# Patient Record
Sex: Female | Born: 1975 | Race: White | Hispanic: No | Smoking: Current every day smoker
Health system: Southern US, Community
[De-identification: ages and names within clinical notes are randomized; demographics above are authoritative.]

## PROBLEM LIST (undated history)

## (undated) DIAGNOSIS — I330 Acute and subacute infective endocarditis: Secondary | ICD-10-CM

## (undated) DIAGNOSIS — D649 Anemia, unspecified: Secondary | ICD-10-CM

## (undated) DIAGNOSIS — F1990 Other psychoactive substance use, unspecified, uncomplicated: Secondary | ICD-10-CM

## (undated) DIAGNOSIS — N179 Acute kidney failure, unspecified: Secondary | ICD-10-CM

## (undated) DIAGNOSIS — O039 Complete or unspecified spontaneous abortion without complication: Secondary | ICD-10-CM

## (undated) DIAGNOSIS — B1920 Unspecified viral hepatitis C without hepatic coma: Secondary | ICD-10-CM

## (undated) HISTORY — PX: BUNIONECTOMY: SHX129

## (undated) HISTORY — PX: TONSILLECTOMY: SUR1361

---

## 2013-12-28 ENCOUNTER — Inpatient Hospital Stay (HOSPITAL_COMMUNITY): Payer: Self-pay

## 2013-12-28 ENCOUNTER — Emergency Department (HOSPITAL_COMMUNITY): Payer: Self-pay

## 2013-12-28 ENCOUNTER — Inpatient Hospital Stay (HOSPITAL_COMMUNITY)
Admission: EM | Admit: 2013-12-28 | Discharge: 2014-01-02 | DRG: 871 | Disposition: A | Discharge status: Home / Self Care | Payer: Self-pay | Attending: Internal Medicine | Admitting: Internal Medicine

## 2013-12-28 ENCOUNTER — Encounter (HOSPITAL_COMMUNITY): Payer: Self-pay | Admitting: Emergency Medicine

## 2013-12-28 DIAGNOSIS — I079 Rheumatic tricuspid valve disease, unspecified: Secondary | ICD-10-CM | POA: Diagnosis present

## 2013-12-28 DIAGNOSIS — F17200 Nicotine dependence, unspecified, uncomplicated: Secondary | ICD-10-CM | POA: Diagnosis present

## 2013-12-28 DIAGNOSIS — R6520 Severe sepsis without septic shock: Secondary | ICD-10-CM

## 2013-12-28 DIAGNOSIS — F1910 Other psychoactive substance abuse, uncomplicated: Secondary | ICD-10-CM | POA: Diagnosis present

## 2013-12-28 DIAGNOSIS — N390 Urinary tract infection, site not specified: Secondary | ICD-10-CM | POA: Diagnosis not present

## 2013-12-28 DIAGNOSIS — F17210 Nicotine dependence, cigarettes, uncomplicated: Secondary | ICD-10-CM | POA: Diagnosis present

## 2013-12-28 DIAGNOSIS — E876 Hypokalemia: Secondary | ICD-10-CM | POA: Diagnosis not present

## 2013-12-28 DIAGNOSIS — B1920 Unspecified viral hepatitis C without hepatic coma: Secondary | ICD-10-CM | POA: Diagnosis present

## 2013-12-28 DIAGNOSIS — D509 Iron deficiency anemia, unspecified: Secondary | ICD-10-CM | POA: Diagnosis present

## 2013-12-28 DIAGNOSIS — D696 Thrombocytopenia, unspecified: Secondary | ICD-10-CM | POA: Diagnosis present

## 2013-12-28 DIAGNOSIS — R768 Other specified abnormal immunological findings in serum: Secondary | ICD-10-CM

## 2013-12-28 DIAGNOSIS — R6521 Severe sepsis with septic shock: Secondary | ICD-10-CM

## 2013-12-28 DIAGNOSIS — R509 Fever, unspecified: Secondary | ICD-10-CM

## 2013-12-28 DIAGNOSIS — M60009 Infective myositis, unspecified site: Secondary | ICD-10-CM | POA: Diagnosis present

## 2013-12-28 DIAGNOSIS — M25519 Pain in unspecified shoulder: Secondary | ICD-10-CM

## 2013-12-28 DIAGNOSIS — R579 Shock, unspecified: Secondary | ICD-10-CM

## 2013-12-28 DIAGNOSIS — A419 Sepsis, unspecified organism: Principal | ICD-10-CM | POA: Diagnosis present

## 2013-12-28 DIAGNOSIS — N911 Secondary amenorrhea: Secondary | ICD-10-CM | POA: Diagnosis present

## 2013-12-28 DIAGNOSIS — M009 Pyogenic arthritis, unspecified: Secondary | ICD-10-CM | POA: Diagnosis present

## 2013-12-28 DIAGNOSIS — Z8249 Family history of ischemic heart disease and other diseases of the circulatory system: Secondary | ICD-10-CM

## 2013-12-28 DIAGNOSIS — N912 Amenorrhea, unspecified: Secondary | ICD-10-CM

## 2013-12-28 DIAGNOSIS — R609 Edema, unspecified: Secondary | ICD-10-CM | POA: Diagnosis present

## 2013-12-28 DIAGNOSIS — M25511 Pain in right shoulder: Secondary | ICD-10-CM

## 2013-12-28 DIAGNOSIS — I330 Acute and subacute infective endocarditis: Secondary | ICD-10-CM | POA: Diagnosis present

## 2013-12-28 DIAGNOSIS — D649 Anemia, unspecified: Secondary | ICD-10-CM | POA: Diagnosis present

## 2013-12-28 HISTORY — DX: Other psychoactive substance use, unspecified, uncomplicated: F19.90

## 2013-12-28 HISTORY — DX: Acute and subacute infective endocarditis: I33.0

## 2013-12-28 HISTORY — DX: Anemia, unspecified: D64.9

## 2013-12-28 LAB — COMPREHENSIVE METABOLIC PANEL
ALK PHOS: 119 U/L — AB (ref 39–117)
ALK PHOS: 146 U/L — AB (ref 39–117)
ALT: 113 U/L — AB (ref 0–35)
ALT: 148 U/L — AB (ref 0–35)
AST: 41 U/L — ABNORMAL HIGH (ref 0–37)
AST: 34 U/L (ref 0–37)
Albumin: 2.5 g/dL — ABNORMAL LOW (ref 3.5–5.2)
Albumin: 2 g/dL — ABNORMAL LOW (ref 3.5–5.2)
Anion gap: 18 — ABNORMAL HIGH (ref 5–15)
Anion gap: 14 (ref 5–15)
BUN: 14 mg/dL (ref 6–23)
BUN: 16 mg/dL (ref 6–23)
CALCIUM: 7.7 mg/dL — AB (ref 8.4–10.5)
CHLORIDE: 93 meq/L — AB (ref 96–112)
CO2: 22 mEq/L (ref 19–32)
CO2: 22 meq/L (ref 19–32)
Calcium: 8.1 mg/dL — ABNORMAL LOW (ref 8.4–10.5)
Chloride: 106 mEq/L (ref 96–112)
Creatinine, Ser: 0.76 mg/dL (ref 0.50–1.10)
Creatinine, Ser: 0.72 mg/dL (ref 0.50–1.10)
GFR calc non Af Amer: >90 mL/min (ref >90)
GLUCOSE: 112 mg/dL — AB (ref 70–99)
Glucose, Bld: 107 mg/dL — ABNORMAL HIGH (ref 70–99)
Potassium: 3.8 mEq/L (ref 3.7–5.3)
Potassium: 3.5 mEq/L — ABNORMAL LOW (ref 3.7–5.3)
SODIUM: 133 meq/L — AB (ref 137–147)
SODIUM: 142 meq/L (ref 137–147)
TOTAL PROTEIN: 5.6 g/dL — AB (ref 6.0–8.3)
TOTAL PROTEIN: 6.6 g/dL (ref 6.0–8.3)
Total Bilirubin: 0.6 mg/dL (ref 0.3–1.2)
Total Bilirubin: 0.6 mg/dL (ref 0.3–1.2)

## 2013-12-28 LAB — CBC WITH DIFFERENTIAL/PLATELET
BASOS PCT: 0 % (ref 0–1)
Basophils Absolute: 0 10*3/uL (ref 0.0–0.1)
Basophils Absolute: 0 10*3/uL (ref 0.0–0.1)
Basophils Relative: 0 % (ref 0–1)
EOS PCT: 0 % (ref 0–5)
Eosinophils Absolute: 0 10*3/uL (ref 0.0–0.7)
Eosinophils Absolute: 0 10*3/uL (ref 0.0–0.7)
Eosinophils Relative: 0 % (ref 0–5)
HCT: 30.4 % — ABNORMAL LOW (ref 36.0–46.0)
HCT: 27.7 % — ABNORMAL LOW (ref 36.0–46.0)
HEMOGLOBIN: 9 g/dL — AB (ref 12.0–15.0)
HEMOGLOBIN: 9.7 g/dL — AB (ref 12.0–15.0)
LYMPHS PCT: 30 % (ref 12–46)
Lymphocytes Relative: 24 % (ref 12–46)
Lymphs Abs: 2.3 10*3/uL (ref 0.7–4.0)
Lymphs Abs: 2 10*3/uL (ref 0.7–4.0)
MCH: 25.1 pg — AB (ref 26.0–34.0)
MCH: 25.9 pg — ABNORMAL LOW (ref 26.0–34.0)
MCHC: 31.9 g/dL (ref 30.0–36.0)
MCHC: 32.5 g/dL (ref 30.0–36.0)
MCV: 78.8 fL (ref 78.0–100.0)
MCV: 79.8 fL (ref 78.0–100.0)
MONO ABS: 1.1 10*3/uL — AB (ref 0.1–1.0)
Monocytes Absolute: 1 10*3/uL (ref 0.1–1.0)
Monocytes Relative: 12 % (ref 3–12)
Monocytes Relative: 15 % — ABNORMAL HIGH (ref 3–12)
NEUTROS PCT: 55 % (ref 43–77)
Neutro Abs: 6.1 10*3/uL (ref 1.7–7.7)
Neutro Abs: 3.8 10*3/uL (ref 1.7–7.7)
Neutrophils Relative %: 64 % (ref 43–77)
PLATELETS: 103 10*3/uL — AB (ref 150–400)
Platelets: 125 10*3/uL — ABNORMAL LOW (ref 150–400)
RBC: 3.86 MIL/uL — ABNORMAL LOW (ref 3.87–5.11)
RBC: 3.47 MIL/uL — AB (ref 3.87–5.11)
RDW: 16.8 % — ABNORMAL HIGH (ref 11.5–15.5)
RDW: 16.9 % — ABNORMAL HIGH (ref 11.5–15.5)
WBC: 9.5 10*3/uL (ref 4.0–10.5)
WBC: 6.8 10*3/uL (ref 4.0–10.5)

## 2013-12-28 LAB — PROTIME-INR
INR: 1.25 (ref 0.00–1.49)
Prothrombin Time: 15.7 seconds — ABNORMAL HIGH (ref 11.6–15.2)

## 2013-12-28 LAB — C-REACTIVE PROTEIN: CRP: 12.5 mg/dL — ABNORMAL HIGH (ref <0.60)

## 2013-12-28 LAB — URINALYSIS, ROUTINE W REFLEX MICROSCOPIC
Glucose, UA: NEGATIVE mg/dL
KETONES UR: NEGATIVE mg/dL
Nitrite: POSITIVE — AB
Protein, ur: 30 mg/dL — AB
SPECIFIC GRAVITY, URINE: 1.022 (ref 1.005–1.030)
UROBILINOGEN UA: 1 mg/dL (ref 0.0–1.0)
pH: 6 (ref 5.0–8.0)

## 2013-12-28 LAB — IRON AND TIBC
IRON: 16 ug/dL — AB (ref 42–135)
Saturation Ratios: 8 % — ABNORMAL LOW (ref 20–55)
TIBC: 205 ug/dL — ABNORMAL LOW (ref 250–470)
UIBC: 189 ug/dL (ref 125–400)

## 2013-12-28 LAB — SEDIMENTATION RATE: Sed Rate: 95 mm/hr — ABNORMAL HIGH (ref 0–22)

## 2013-12-28 LAB — RETICULOCYTES
RBC.: 3.47 MIL/uL — ABNORMAL LOW (ref 3.87–5.11)
Retic Count, Absolute: 13.9 10*3/uL — ABNORMAL LOW (ref 19.0–186.0)
Retic Ct Pct: 0.4 % (ref 0.4–3.1)

## 2013-12-28 LAB — URINE MICROSCOPIC-ADD ON

## 2013-12-28 LAB — MRSA PCR SCREENING: MRSA BY PCR: NEGATIVE

## 2013-12-28 LAB — HCG, QUANTITATIVE, PREGNANCY: hCG, Beta Chain, Quant, S: <1 m[IU]/mL (ref <5)

## 2013-12-28 LAB — ETHANOL: Alcohol, Ethyl (B): <11 mg/dL (ref 0–11)

## 2013-12-28 LAB — FERRITIN: Ferritin: 117 ng/mL (ref 10–291)

## 2013-12-28 LAB — MONONUCLEOSIS SCREEN: Mono Screen: NEGATIVE

## 2013-12-28 LAB — ACETAMINOPHEN LEVEL

## 2013-12-28 LAB — TSH: TSH: 0.392 u[IU]/mL (ref 0.350–4.500)

## 2013-12-28 LAB — LACTATE DEHYDROGENASE: LDH: 179 U/L (ref 94–250)

## 2013-12-28 LAB — PROLACTIN: Prolactin: 2.2 ng/mL

## 2013-12-28 LAB — I-STAT CG4 LACTIC ACID, ED: Lactic Acid, Venous: 1.18 mmol/L (ref 0.5–2.2)

## 2013-12-28 LAB — PREGNANCY, URINE: Preg Test, Ur: NEGATIVE

## 2013-12-28 LAB — FOLLICLE STIMULATING HORMONE: FSH: 4.2 m[IU]/mL

## 2013-12-28 MED ORDER — ONDANSETRON HCL 4 MG PO TABS: 4.0000 mg | ORAL_TABLET | Freq: Four times a day (QID) | ORAL | Status: DC | PRN

## 2013-12-28 MED ORDER — DEXTROSE 5 % IV SOLN
1.0000 g | INTRAVENOUS | Status: DC
  Administered 2013-12-28 – 2013-12-29 (×2): 1 g via INTRAVENOUS
  Filled 2013-12-28 (×4): qty 10

## 2013-12-28 MED ORDER — SODIUM CHLORIDE 0.9 % IJ SOLN
3.0000 mL | Freq: Two times a day (BID) | INTRAMUSCULAR | Status: DC
  Administered 2013-12-31: 3 mL via INTRAVENOUS

## 2013-12-28 MED ORDER — HYDROCODONE-ACETAMINOPHEN 5-325 MG PO TABS: 1.0000 | ORAL_TABLET | ORAL | Status: DC | PRN

## 2013-12-28 MED ORDER — PIPERACILLIN-TAZOBACTAM 3.375 G IVPB 30 MIN
3.3750 g | Freq: Once | INTRAVENOUS | Status: AC
  Administered 2013-12-28: 3.375 g via INTRAVENOUS
  Filled 2013-12-28: qty 50

## 2013-12-28 MED ORDER — LIDOCAINE 5 % EX PTCH
1.0000 | MEDICATED_PATCH | CUTANEOUS | Status: DC
  Administered 2013-12-28 – 2014-01-02 (×6): 1 via TRANSDERMAL
  Filled 2013-12-28 (×6): qty 1

## 2013-12-28 MED ORDER — SODIUM CHLORIDE 0.9 % IV BOLUS (SEPSIS)
1000.0000 mL | Freq: Once | INTRAVENOUS | Status: AC
  Administered 2013-12-28: 1000 mL via INTRAVENOUS

## 2013-12-28 MED ORDER — ACETAMINOPHEN 325 MG PO TABS: 650.0000 mg | ORAL_TABLET | Freq: Four times a day (QID) | ORAL | Status: DC | PRN

## 2013-12-28 MED ORDER — DIPHENHYDRAMINE HCL 25 MG PO CAPS
25.0000 mg | ORAL_CAPSULE | Freq: Three times a day (TID) | ORAL | Status: DC | PRN
  Administered 2013-12-30: 25 mg via ORAL
  Filled 2013-12-28: qty 1

## 2013-12-28 MED ORDER — VANCOMYCIN HCL IN DEXTROSE 750-5 MG/150ML-% IV SOLN
750.0000 mg | Freq: Three times a day (TID) | INTRAVENOUS | Status: DC
  Administered 2013-12-28 – 2013-12-29 (×3): 750 mg via INTRAVENOUS
  Filled 2013-12-28 (×8): qty 150

## 2013-12-28 MED ORDER — VANCOMYCIN HCL 10 G IV SOLR: 1.0000 g | Freq: Once | INTRAVENOUS | Status: DC

## 2013-12-28 MED ORDER — ONDANSETRON HCL 4 MG/2ML IJ SOLN: 4.0000 mg | Freq: Three times a day (TID) | INTRAMUSCULAR | Status: DC | PRN

## 2013-12-28 MED ORDER — SODIUM CHLORIDE 0.9 % IV SOLN
INTRAVENOUS | Status: DC
  Administered 2013-12-28 – 2013-12-31 (×3): via INTRAVENOUS

## 2013-12-28 MED ORDER — OXYCODONE HCL 5 MG PO TABS
5.0000 mg | ORAL_TABLET | ORAL | Status: DC | PRN
  Administered 2013-12-28 (×4): 10 mg via ORAL
  Administered 2013-12-29: 5 mg via ORAL
  Administered 2013-12-29 – 2014-01-01 (×18): 10 mg via ORAL
  Administered 2014-01-01: 5 mg via ORAL
  Administered 2014-01-01 – 2014-01-02 (×6): 10 mg via ORAL
  Administered 2014-01-02: 5 mg via ORAL
  Filled 2013-12-28 (×25): qty 2
  Filled 2013-12-28: qty 1
  Filled 2013-12-28 (×9): qty 2

## 2013-12-28 MED ORDER — OXYCODONE-ACETAMINOPHEN 5-325 MG PO TABS
2.0000 | ORAL_TABLET | Freq: Once | ORAL | Status: AC
Start: 2013-12-28 — End: 2013-12-28
  Administered 2013-12-28: 2 via ORAL
  Filled 2013-12-28: qty 2

## 2013-12-28 MED ORDER — DOXYCYCLINE HYCLATE 100 MG IV SOLR
100.0000 mg | Freq: Two times a day (BID) | INTRAVENOUS | Status: DC
  Administered 2013-12-28 – 2013-12-29 (×3): 100 mg via INTRAVENOUS
  Filled 2013-12-28 (×5): qty 100

## 2013-12-28 MED ORDER — ENOXAPARIN SODIUM 40 MG/0.4ML ~~LOC~~ SOLN
40.0000 mg | SUBCUTANEOUS | Status: DC
  Administered 2013-12-28 – 2014-01-01 (×5): 40 mg via SUBCUTANEOUS
  Filled 2013-12-28 (×6): qty 0.4

## 2013-12-28 MED ORDER — VANCOMYCIN HCL IN DEXTROSE 1-5 GM/200ML-% IV SOLN
1000.0000 mg | Freq: Once | INTRAVENOUS | Status: AC
  Administered 2013-12-28: 1000 mg via INTRAVENOUS
  Filled 2013-12-28: qty 200

## 2013-12-28 MED ORDER — LIDOCAINE HCL (PF) 1 % IJ SOLN
INTRAMUSCULAR | Status: AC
  Administered 2013-12-28: 03:00:00
  Filled 2013-12-28: qty 5

## 2013-12-28 MED ORDER — ACETAMINOPHEN 650 MG RE SUPP: 650.0000 mg | Freq: Four times a day (QID) | RECTAL | Status: DC | PRN

## 2013-12-28 MED ORDER — ONDANSETRON HCL 4 MG/2ML IJ SOLN: 4.0000 mg | Freq: Four times a day (QID) | INTRAMUSCULAR | Status: DC | PRN

## 2013-12-28 MED ORDER — GADOBENATE DIMEGLUMINE 529 MG/ML IV SOLN
15.0000 mL | Freq: Once | INTRAVENOUS | Status: AC | PRN
  Administered 2013-12-28: 15 mL via INTRAVENOUS

## 2013-12-28 MED ORDER — FENTANYL CITRATE 0.05 MG/ML IJ SOLN
100.0000 ug | INTRAMUSCULAR | Status: DC | PRN
  Administered 2013-12-28 – 2014-01-02 (×32): 100 ug via INTRAVENOUS
  Filled 2013-12-28 (×33): qty 2

## 2013-12-28 MED ORDER — FAMOTIDINE IN NACL 20-0.9 MG/50ML-% IV SOLN
20.0000 mg | Freq: Two times a day (BID) | INTRAVENOUS | Status: DC
  Administered 2013-12-28 (×2): 20 mg via INTRAVENOUS
  Filled 2013-12-28 (×4): qty 50

## 2013-12-28 NOTE — ED Notes (Signed)
Dr. Hyacinth MeekerMiller at bedside. Pt reports swelling in her rt index finger, swelling went down and then she began having rt shoulder pain for about a week. Pt reports pain moves across her back into her neck and across her chest. Sx in shoulder started 3 days ago, and finger sx started a week ago. Pt unable to move her shoulder, has pain with deep inhalation. Pt rates pain 10/10.

## 2013-12-28 NOTE — Progress Notes (Signed)
Subjective: Pt has had MRI   Objective: Vital signs in last 24 hours: Temp:  [98 F (36.7 C)-102.8 F (39.3 C)] 98 F (36.7 C) (07/19 1831) Pulse Rate:  [77-124] 79 (07/19 1831) Resp:  [12-22] 18 (07/19 1831) BP: (87-137)/(55-96) 137/96 mmHg (07/19 1831) SpO2:  [95 %-100 %] 100 % (07/19 1831) Weight:  [151 lb (68.493 kg)-154 lb 12.2 oz (70.2 kg)] 154 lb 12.2 oz (70.2 kg) (07/19 0430)  Intake/Output from previous day: 07/18 0701 - 07/19 0700 In: 500 [I.V.:200; IV Piggyback:300] Out: 500 [Urine:500] Intake/Output this shift:     Recent Labs  12/28/13 0042 12/28/13 0546  HGB 9.7* 9.0*    Recent Labs  12/28/13 0042 12/28/13 0546  WBC 9.5 6.8  RBC 3.86* 3.47*  3.47*  HCT 30.4* 27.7*  PLT 125* 103*    Recent Labs  12/28/13 0042 12/28/13 0546  NA 133* 142  K 3.8 3.5*  CL 93* 106  CO2 22 22  BUN 16 14  CREATININE 0.76 0.72  GLUCOSE 112* 107*  CALCIUM 8.1* 7.7*    Recent Labs  12/28/13 0546  INR 1.25    not redone at this time MRI: edema in trapezius, deltoid, supraspinatus and some [facility] in [facility] which does not appear infectious Assessment/Plan: MRI shows edema in the anterior deltoid, trapezius muscle and supraspinatus with no abscess.  AC joint shows fluid with no abscess//  Will monitor for now but am uncomfortable injecting steroid in this setting.  Will need close f/u.    Dmarcus Decicco L 12/28/2013, 8:33 PM

## 2013-12-28 NOTE — H&P (Addendum)
[facility] History and Physical  Patient: Melanie Crane  ZOX:[id number]  DOB: 01/24/1976  DOS: the patient was seen and examined on 12/28/2013 PCP: No PCP Per Patient  Chief Complaint: Right shoulder pain  HPI: Melanie RavelingJoann Theresa Crane is a 38 y.o. female with no significant Past medical history. Patient presented with complaints of right shoulder pain that has been ongoing since last 3 days. She mentions that she woke up with from her sleep with swelling of her right index finger. It was not red but it was tender. The swelling resolved within 2 days but then she started having pain in her right shoulder. The shoulder pain progressively worsened to the extent that any movement of her shoulder was causing pain and therefore she decided to come to the hospital. She denies any fever, chills, nausea, vomiting, headache, blurred vision, sore throat, chest pain, shortness of breath, cough, diarrhea, abdominal pain, nausea, vomiting, acid reflux, burning urination, active bleeding, leg swelling, abdominal fullness, decreased appetite, focal deficit. She denies being on any prescription medications. She has taken Tylenol, ibuprofen, opana from her father for her pain without any benefit. She mentions her last beer was 4-5 months ago prior to that she had regular monthly periods she denies any vaginal discharge or bleeding. She denies anyone any spotting. She denies having any relationships at present. She denies any possibility of being pregnant. She denies any prior STDs.  The patient is coming from home. And at her baseline independent for most of her ADL.  Review of Systems: as mentioned in the history of present illness.  A Comprehensive review of the other systems is negative.  History reviewed. No pertinent past medical history. History reviewed. No pertinent past surgical history. Social History:  reports that she has been smoking.  She has never used smokeless tobacco. She reports that  she does not drink alcohol or use illicit drugs.  No Known Allergies  No family history on file.  Prior to Admission medications   Not on File    Physical Exam: Filed Vitals:   12/28/13 0300 12/28/13 0315 12/28/13 0430 12/28/13 0445  BP: 87/55 88/56 103/71 103/66  Pulse: 83 79 77 85  Temp:   98.8 F (37.1 C)   TempSrc:   Oral   Resp: 12 14 19 18   Height:   5\' 4"  (1.626 m)   Weight:   70.2 kg (154 lb 12.2 oz)   SpO2: 97% 96% 98% 96%    General: Alert, Awake and Oriented to Time, Place and Person. Appear in mild distress Eyes: PERRL ENT: Oral Mucosa clear moist. Neck: No JVD Cardiovascular: S1 and S2 Present, no Murmur, Peripheral Pulses Present Respiratory: Bilateral Air entry equal and Decreased, Clear to Auscultation, no Crackles, no wheezes Abdomen: Bowel Sound Present, Soft and Non tender Skin: No Rash Extremities: No Pedal edema, no calf tenderness Right shoulder tenderness radiating to base of the neck. No neck stiffness. No tenderness of the elbow joint. Some induration at the index finger but no warmth redness or tenderness.  Neurologic: Grossly no focal neuro deficit. No meningismus  Labs on Admission:  CBC:  Recent Labs Lab 12/28/13 0042 12/28/13 0546  WBC 9.5 6.8  NEUTROABS 6.1 PENDING  HGB 9.7* 9.0*  HCT 30.4* 27.7*  MCV 78.8 79.8  PLT 125* 103*    CMP     Component Value Date/Time   NA 142 12/28/2013 0546   K 3.5* 12/28/2013 0546   CL 106 12/28/2013 0546   CO2 22  12/28/2013 0546   GLUCOSE 107* 12/28/2013 0546   BUN 14 12/28/2013 0546   CREATININE 0.72 12/28/2013 0546   CALCIUM 7.7* 12/28/2013 0546   PROT 5.6* 12/28/2013 0546   ALBUMIN 2.0* 12/28/2013 0546   AST 34 12/28/2013 0546   ALT 113* 12/28/2013 0546   ALKPHOS 119* 12/28/2013 0546   BILITOT 0.6 12/28/2013 0546   GFRNONAA >90 12/28/2013 0546   GFRAA >90 12/28/2013 0546    No results found for this basename: LIPASE, AMYLASE,  in the last 168 hours No results found for this basename:  AMMONIA,  in the last 168 hours  No results found for this basename: CKTOTAL, CKMB, CKMBINDEX, TROPONINI,  in the last 168 hours BNP (last 3 results) No results found for this basename: PROBNP,  in the last 8760 hours  Radiological Exams on Admission: Dg Chest 1 View  12/28/2013   CLINICAL DATA:  Right shoulder and chest pain. Pain with deep inspiration.  EXAM: CHEST - 1 VIEW  COMPARISON:  None.  FINDINGS: The lungs are well-aerated. Mild bibasilar opacities may reflect atelectasis or possibly mild pneumonia. There is no evidence of pleural effusion or pneumothorax.  The cardiomediastinal silhouette is within normal limits. No acute osseous abnormalities are seen. The right glenohumeral joint is grossly unremarkable in appearance.  IMPRESSION: Mild bibasilar opacities may reflect atelectasis or possibly mild pneumonia.   Electronically Signed   By: Roanna Raider M.D.   On: 12/28/2013 01:21   Dg Shoulder Right  12/28/2013   CLINICAL DATA:  Right shoulder pain, radiating to neck and chest. Difficulty moving right shoulder.  EXAM: RIGHT SHOULDER - 2+ VIEW  COMPARISON:  None.  FINDINGS: There is no evidence of fracture or dislocation. The right humeral head is seated within the glenoid fossa. The acromioclavicular joint is unremarkable in appearance. No significant soft tissue abnormalities are seen. The visualized portions of the right lung are clear.  IMPRESSION: No evidence of fracture or dislocation.   Electronically Signed   By: Roanna Raider M.D.   On: 12/28/2013 01:20   US Abdomen Complete  12/28/2013   CLINICAL DATA:  Elevated LFTs.  EXAM: ULTRASOUND ABDOMEN COMPLETE  COMPARISON:  None.  FINDINGS: Gallbladder:  Relatively contracted. A 1.4 cm focus of increased echogenicity is noted at the gallbladder fundus, which may reflect an adenomatous polyp, tumefactive sludge or less likely a stone. No posterior acoustic shadowing is seen. No ultrasonographic Murphy's sign is elicited. No pericholecystic  fluid is seen.  Common bile duct:  Diameter: 0.7 cm, borderline prominent, without evidence of distal obstruction.  Liver:  A small 1.3 cm focus of increased echogenicity within the left hepatic lobe may reflect a hemangioma. No additional mass is seen. Within normal limits in parenchymal echogenicity.  IVC:  No abnormality visualized.  Pancreas:  Visualized portion unremarkable.  Spleen:  Enlarged, measuring 16.7 cm in length, with a volume of 1240 mL.  Right Kidney:  Length: 14.4 cm. Echogenicity within normal limits. No mass or hydronephrosis visualized.  Left Kidney:  Length: 14.1 cm. Echogenicity within normal limits. No mass or hydronephrosis visualized.  Abdominal aorta:  No aneurysm visualized.  Other findings:  A 1.0 cm lymph node adjacent to the pancreatic head is borderline normal in size.  IMPRESSION: 1. 1.3 cm focus of increased echogenicity within the left hepatic lobe is thought to reflect an hemangioma. Liver otherwise unremarkable in appearance. Would consider follow-up LFTs to ensure stability. 2. 1.4 cm focus of increased echogenicity noted at the gallbladder fundus.  This may reflect an adenomatous polyp or tumefactive sludge. No posterior acoustic shadowing is seen, suggesting against a stone. No evidence for obstruction or cholecystitis. Endoscopic ultrasound could be considered for further evaluation, as deemed clinically appropriate. 3. Splenomegaly noted.   Electronically Signed   By: Roanna Raider M.D.   On: 12/28/2013 04:57    EKG: Independently reviewed. sinus tachycardia. Assessment/Plan Principal Problem:   Shock Active Problems:   Fever   Secondary amenorrhea   Right shoulder pain   1. Shock Patient is presenting with complaints of right shoulder pain. On further evaluation she is found to have abnormal elevated LFT, microcytic anemia, thrombocytopenia, atypical lymphocytes with basophilic stippling, elevated sedimentation rate with possible pyuria, normal chest x-ray,  normal shoulder x-ray, dry aspiration of shoulder, splenomegaly otherwise nonsignificant ultrasound of her abdomen.  She was febrile with tachycardia and after 3 L of fluid resuscitation remains borderline hypotensive. She was changed from telemetry to step down. Currently the only positive finding is abnormal LFT with secondary amenorrhea. I am treating her broad-spectrum with vancomycin ceftriaxone and doxycycline which should provide coverage for any intra-abdominal or PID. She does not appear to be at risk for any pseudomonal infection. Continue to monitor and IV hydration. Pain management gently due to hypotensive episodes. Infectious diseases may need to be consulted for the patient. Obtain cultures, infectious mononucleosis screening  2. Secondary amenorrhea Etiology currently unclear. Patient think she is going through menopause although she's not in the age group. Check prolactin FSH TSH.  3. Right shoulder pain. Etiology currently on clear to The pain does not improve she may require orthopedic consultation.  DVT Prophylaxis: subcutaneous Heparin Nutrition: Regular diet  Code Status: Full  Family Communication: Mother was present at bedside, opportunity was given to ask question and all questions were answered satisfactorily at the time of interview. Disposition: Admitted to inpatient in step-down unit.  Author: Lynden Oxford, MD [facility] Pager: [phone] 12/28/2013, 6:44 AM    If 7PM-7AM, please contact night-coverage [email or web address] Password [email or web address]  **Disclaimer: This note may have been dictated with voice recognition software. Similar sounding words can inadvertently be transcribed and this note may contain transcription errors which may not have been corrected upon publication of note.**

## 2013-12-28 NOTE — Progress Notes (Signed)
Utilization Review Completed.Kym Fenter T7/19/2015  

## 2013-12-28 NOTE — Progress Notes (Signed)
Patient admitted after midnight. Chart reviewed. Patient examined. Still with severe shoulder pain. Groggy from pain medications. Mono screen negative, TSH ok. Fever down, tachycardia and hypotension resolved. Per Dr. Allena KatzPatel, ortho Luiz Blare([facility]) to consult today.  Crista Curborinna Kashara Blocher, MD [facility] [phone]

## 2013-12-28 NOTE — Consult Note (Signed)
Reason for Consult:r shoulder pain Referring Physician: hospitalists  Melanie Crane is an 38 y.o. female.  HPI: 38 yo female in mod distress with r shoulder pain for 3 days.  She started with r index finger pain which has resolved and now has painful r shoulder.  Currently traeted with broad spectrum ABX without a source.  We are consulted for management of shoulder.  History reviewed. No pertinent past medical history.  History reviewed. No pertinent past surgical history.  No family history on file.  Social History:  reports that she has been smoking.  She has never used smokeless tobacco. She reports that she does not drink alcohol or use illicit drugs.  Allergies: No Known Allergies  Medications: I have reviewed the patient's current medications.  Results for orders placed during the hospital encounter of 12/28/13 (from the past 48 hour(s))  CBC WITH DIFFERENTIAL     Status: Abnormal   Collection Time    12/28/13 12:42 AM      Result Value Ref Range   WBC 9.5  4.0 - 10.5 K/uL   RBC 3.86 (*) 3.87 - 5.11 MIL/uL   Hemoglobin 9.7 (*) 12.0 - 15.0 g/dL   HCT 30.4 (*) 36.0 - 46.0 %   MCV 78.8  78.0 - 100.0 fL   MCH 25.1 (*) 26.0 - 34.0 pg   MCHC 31.9  30.0 - 36.0 g/dL   RDW 16.8 (*) 11.5 - 15.5 %   Platelets 125 (*) 150 - 400 K/uL   Neutrophils Relative % 64  43 - 77 %   Lymphocytes Relative 24  12 - 46 %   Monocytes Relative 12  3 - 12 %   Eosinophils Relative 0  0 - 5 %   Basophils Relative 0  0 - 1 %   Neutro Abs 6.1  1.7 - 7.7 K/uL   Lymphs Abs 2.3  0.7 - 4.0 K/uL   Monocytes Absolute 1.1 (*) 0.1 - 1.0 K/uL   Eosinophils Absolute 0.0  0.0 - 0.7 K/uL   Basophils Absolute 0.0  0.0 - 0.1 K/uL   RBC Morphology BASOPHILIC STIPPLING     Comment: OVALOCYTES   WBC Morphology ATYPICAL LYMPHOCYTES    SEDIMENTATION RATE     Status: Abnormal   Collection Time    12/28/13 12:42 AM      Result Value Ref Range   Sed Rate 95 (*) 0 - 22 mm/hr  COMPREHENSIVE METABOLIC PANEL      Status: Abnormal   Collection Time    12/28/13 12:42 AM      Result Value Ref Range   Sodium 133 (*) 137 - 147 mEq/L   Potassium 3.8  3.7 - 5.3 mEq/L   Chloride 93 (*) 96 - 112 mEq/L   CO2 22  19 - 32 mEq/L   Glucose, Bld 112 (*) 70 - 99 mg/dL   BUN 16  6 - 23 mg/dL   Creatinine, Ser 0.76  0.50 - 1.10 mg/dL   Calcium 8.1 (*) 8.4 - 10.5 mg/dL   Total Protein 6.6  6.0 - 8.3 g/dL   Albumin 2.5 (*) 3.5 - 5.2 g/dL   AST 41 (*) 0 - 37 U/L   ALT 148 (*) 0 - 35 U/L   Alkaline Phosphatase 146 (*) 39 - 117 U/L   Total Bilirubin 0.6  0.3 - 1.2 mg/dL   GFR calc non Af Amer >90  >90 mL/min   GFR calc Af Amer >90  >90 mL/min  Comment: (NOTE)     The eGFR has been calculated using the CKD EPI equation.     This calculation has not been validated in all clinical situations.     eGFR's persistently <90 mL/min signify possible Chronic Kidney     Disease.   Anion gap 18 (*) 5 - 15  I-STAT CG4 LACTIC ACID, ED     Status: None   Collection Time    12/28/13 12:50 AM      Result Value Ref Range   Lactic Acid, Venous 1.18  0.5 - 2.2 mmol/L  URINALYSIS, ROUTINE W REFLEX MICROSCOPIC     Status: Abnormal   Collection Time    12/28/13  1:41 AM      Result Value Ref Range   Color, Urine AMBER (*) YELLOW   Comment: BIOCHEMICALS MAY BE AFFECTED BY COLOR   APPearance CLOUDY (*) CLEAR   Specific Gravity, Urine 1.022  1.005 - 1.030   pH 6.0  5.0 - 8.0   Glucose, UA NEGATIVE  NEGATIVE mg/dL   Hgb urine dipstick SMALL (*) NEGATIVE   Bilirubin Urine SMALL (*) NEGATIVE   Ketones, ur NEGATIVE  NEGATIVE mg/dL   Protein, ur 30 (*) NEGATIVE mg/dL   Urobilinogen, UA 1.0  0.0 - 1.0 mg/dL   Nitrite POSITIVE (*) NEGATIVE   Leukocytes, UA SMALL (*) NEGATIVE  URINE MICROSCOPIC-ADD ON     Status: Abnormal   Collection Time    12/28/13  1:41 AM      Result Value Ref Range   Squamous Epithelial / LPF RARE  RARE   WBC, UA 7-10  <3 WBC/hpf   RBC / HPF 3-6  <3 RBC/hpf   Bacteria, UA MANY (*) RARE   Crystals CA  OXALATE CRYSTALS (*) NEGATIVE  PREGNANCY, URINE     Status: None   Collection Time    12/28/13  1:41 AM      Result Value Ref Range   Preg Test, Ur NEGATIVE  NEGATIVE   Comment:            THE SENSITIVITY OF THIS     METHODOLOGY IS >20 mIU/mL.  ACETAMINOPHEN LEVEL     Status: None   Collection Time    12/28/13  2:39 AM      Result Value Ref Range   Acetaminophen (Tylenol), Serum <15.0  10 - 30 ug/mL   Comment:            THERAPEUTIC CONCENTRATIONS VARY     SIGNIFICANTLY. A RANGE OF 10-30     ug/mL MAY BE AN EFFECTIVE     CONCENTRATION FOR MANY PATIENTS.     HOWEVER, SOME ARE BEST TREATED     AT CONCENTRATIONS OUTSIDE THIS     RANGE.     ACETAMINOPHEN CONCENTRATIONS     >150 ug/mL AT 4 HOURS AFTER     INGESTION AND >50 ug/mL AT 12     HOURS AFTER INGESTION ARE     OFTEN ASSOCIATED WITH TOXIC     REACTIONS.  ETHANOL     Status: None   Collection Time    12/28/13  2:39 AM      Result Value Ref Range   Alcohol, Ethyl (B) <11  0 - 11 mg/dL   Comment:            LOWEST DETECTABLE LIMIT FOR     SERUM ALCOHOL IS 11 mg/dL     FOR MEDICAL PURPOSES ONLY  MRSA PCR SCREENING  Status: None   Collection Time    12/28/13  4:44 AM      Result Value Ref Range   MRSA by PCR NEGATIVE  NEGATIVE   Comment:            The GeneXpert MRSA Assay (FDA     approved for NASAL specimens     only), is one component of a     comprehensive MRSA colonization     surveillance program. It is not     intended to diagnose MRSA     infection nor to guide or     monitor treatment for     MRSA infections.  HCG, QUANTITATIVE, PREGNANCY     Status: None   Collection Time    12/28/13  5:46 AM      Result Value Ref Range   hCG, Beta Chain, Quant, S <1  <5 mIU/mL   Comment:              GEST. AGE      CONC.  (mIU/mL)       <=1 WEEK        5 - 50         2 WEEKS       50 - 500         3 WEEKS       100 - 10,000         4 WEEKS     1,000 - 30,000         5 WEEKS     3,500 - 115,000       6-8 WEEKS      12,000 - 270,000        12 WEEKS     15,000 - 220,000                FEMALE AND NON-PREGNANT FEMALE:         LESS THAN 5 mIU/mL  TSH     Status: None   Collection Time    12/28/13  5:46 AM      Result Value Ref Range   TSH 0.392  0.350 - 4.500 uIU/mL  CBC WITH DIFFERENTIAL     Status: Abnormal   Collection Time    12/28/13  5:46 AM      Result Value Ref Range   WBC 6.8  4.0 - 10.5 K/uL   RBC 3.47 (*) 3.87 - 5.11 MIL/uL   Hemoglobin 9.0 (*) 12.0 - 15.0 g/dL   HCT 27.7 (*) 36.0 - 46.0 %   MCV 79.8  78.0 - 100.0 fL   MCH 25.9 (*) 26.0 - 34.0 pg   MCHC 32.5  30.0 - 36.0 g/dL   RDW 16.9 (*) 11.5 - 15.5 %   Platelets 103 (*) 150 - 400 K/uL   Neutrophils Relative % 55  43 - 77 %   Lymphocytes Relative 30  12 - 46 %   Monocytes Relative 15 (*) 3 - 12 %   Eosinophils Relative 0  0 - 5 %   Basophils Relative 0  0 - 1 %   Neutro Abs 3.8  1.7 - 7.7 K/uL   Lymphs Abs 2.0  0.7 - 4.0 K/uL   Monocytes Absolute 1.0  0.1 - 1.0 K/uL   Eosinophils Absolute 0.0  0.0 - 0.7 K/uL   Basophils Absolute 0.0  0.0 - 0.1 K/uL   RBC Morphology ELLIPTOCYTES     WBC Morphology ATYPICAL LYMPHOCYTES     Comment:  RARE  RETICULOCYTES     Status: Abnormal   Collection Time    12/28/13  5:46 AM      Result Value Ref Range   Retic Ct Pct 0.4  0.4 - 3.1 %   RBC. 3.47 (*) 3.87 - 5.11 MIL/uL   Retic Count, Manual 13.9 (*) 19.0 - 186.0 K/uL  PROTIME-INR     Status: Abnormal   Collection Time    12/28/13  5:46 AM      Result Value Ref Range   Prothrombin Time 15.7 (*) 11.6 - 15.2 seconds   INR 1.25  0.00 - 1.49  COMPREHENSIVE METABOLIC PANEL     Status: Abnormal   Collection Time    12/28/13  5:46 AM      Result Value Ref Range   Sodium 142  137 - 147 mEq/L   Comment: DELTA CHECK NOTED   Potassium 3.5 (*) 3.7 - 5.3 mEq/L   Chloride 106  96 - 112 mEq/L   CO2 22  19 - 32 mEq/L   Glucose, Bld 107 (*) 70 - 99 mg/dL   BUN 14  6 - 23 mg/dL   Creatinine, Ser 0.72  0.50 - 1.10 mg/dL   Calcium 7.7 (*) 8.4 -  10.5 mg/dL   Total Protein 5.6 (*) 6.0 - 8.3 g/dL   Albumin 2.0 (*) 3.5 - 5.2 g/dL   AST 34  0 - 37 U/L   ALT 113 (*) 0 - 35 U/L   Alkaline Phosphatase 119 (*) 39 - 117 U/L   Total Bilirubin 0.6  0.3 - 1.2 mg/dL   GFR calc non Af Amer >90  >90 mL/min   GFR calc Af Amer >90  >90 mL/min   Comment: (NOTE)     The eGFR has been calculated using the CKD EPI equation.     This calculation has not been validated in all clinical situations.     eGFR's persistently <90 mL/min signify possible Chronic Kidney     Disease.   Anion gap 14  5 - 15  MONONUCLEOSIS SCREEN     Status: None   Collection Time    12/28/13  5:46 AM      Result Value Ref Range   Mono Screen NEGATIVE  NEGATIVE  LACTATE DEHYDROGENASE     Status: None   Collection Time    12/28/13  5:46 AM      Result Value Ref Range   LDH 179  94 - 250 U/L    Dg Chest 1 View  12/28/2013   CLINICAL DATA:  Right shoulder and chest pain. Pain with deep inspiration.  EXAM: CHEST - 1 VIEW  COMPARISON:  None.  FINDINGS: The lungs are well-aerated. Mild bibasilar opacities may reflect atelectasis or possibly mild pneumonia. There is no evidence of pleural effusion or pneumothorax.  The cardiomediastinal silhouette is within normal limits. No acute osseous abnormalities are seen. The right glenohumeral joint is grossly unremarkable in appearance.  IMPRESSION: Mild bibasilar opacities may reflect atelectasis or possibly mild pneumonia.   Electronically Signed   By: Garald Balding M.D.   On: 12/28/2013 01:21   Dg Shoulder Right  12/28/2013   CLINICAL DATA:  Right shoulder pain, radiating to neck and chest. Difficulty moving right shoulder.  EXAM: RIGHT SHOULDER - 2+ VIEW  COMPARISON:  None.  FINDINGS: There is no evidence of fracture or dislocation. The right humeral head is seated within the glenoid fossa. The acromioclavicular joint is unremarkable in appearance. No significant  soft tissue abnormalities are seen. The visualized portions of the right  lung are clear.  IMPRESSION: No evidence of fracture or dislocation.   Electronically Signed   By: Garald Balding M.D.   On: 12/28/2013 01:20   US Abdomen Complete  12/28/2013   CLINICAL DATA:  Elevated LFTs.  EXAM: ULTRASOUND ABDOMEN COMPLETE  COMPARISON:  None.  FINDINGS: Gallbladder:  Relatively contracted. A 1.4 cm focus of increased echogenicity is noted at the gallbladder fundus, which may reflect an adenomatous polyp, tumefactive sludge or less likely a stone. No posterior acoustic shadowing is seen. No ultrasonographic Murphy's sign is elicited. No pericholecystic fluid is seen.  Common bile duct:  Diameter: 0.7 cm, borderline prominent, without evidence of distal obstruction.  Liver:  A small 1.3 cm focus of increased echogenicity within the left hepatic lobe may reflect a hemangioma. No additional mass is seen. Within normal limits in parenchymal echogenicity.  IVC:  No abnormality visualized.  Pancreas:  Visualized portion unremarkable.  Spleen:  Enlarged, measuring 16.7 cm in length, with a volume of 1240 mL.  Right Kidney:  Length: 14.4 cm. Echogenicity within normal limits. No mass or hydronephrosis visualized.  Left Kidney:  Length: 14.1 cm. Echogenicity within normal limits. No mass or hydronephrosis visualized.  Abdominal aorta:  No aneurysm visualized.  Other findings:  A 1.0 cm lymph node adjacent to the pancreatic head is borderline normal in size.  IMPRESSION: 1. 1.3 cm focus of increased echogenicity within the left hepatic lobe is thought to reflect an hemangioma. Liver otherwise unremarkable in appearance. Would consider follow-up LFTs to ensure stability. 2. 1.4 cm focus of increased echogenicity noted at the gallbladder fundus. This may reflect an adenomatous polyp or tumefactive sludge. No posterior acoustic shadowing is seen, suggesting against a stone. No evidence for obstruction or cholecystitis. Endoscopic ultrasound could be considered for further evaluation, as deemed clinically  appropriate. 3. Splenomegaly noted.   Electronically Signed   By: Garald Balding M.D.   On: 12/28/2013 04:57    ROS ROS: I have reviewed the patient's review of systems thoroughly and there are no positive responses as relates to the HPI. Blood pressure 103/66, pulse 85, temperature 98.4 F (36.9 C), temperature source Oral, resp. rate 18, height _0  (1.626 m), weight 154 lb 12.2 oz (70.2 kg), SpO2 96.00%. Physical Exam Well-developed well-nourished patient in no acute distress. Alert and oriented x3 HEENT:within normal limits Cardiac: Regular rate and rhythm Pulmonary: Lungs clear to auscultation Abdomen: Soft and nontender.  Normal active bowel sounds  Musculoskeletal: (r shoulder min pain with rotation //mod pain with elevation and impingement testing.  Min sts or effusion.//    Assessment/Plan:  Pt likely has some viral or systemis inflammatory process which has localized to r shoulder.  Need to r/o infection in shoulder joint and proximal humerus-- will get gadolinium enhansed MRI r shoulder and follow with possible injection if MRI neg for infection  Delontae Melanie Crane L 12/28/2013, 10:26 AM

## 2013-12-28 NOTE — Progress Notes (Signed)
Report called in to  [employer] [place] RN . Pt will be coming from MRI.

## 2013-12-28 NOTE — ED Provider Notes (Signed)
CSN: [id number]     Arrival date & time 12/28/13  0001 History   First MD Initiated Contact with Patient 12/28/13 0018     Chief Complaint  Patient presents with  . Shoulder Pain  . Fever     (Consider location/radiation/quality/duration/timing/severity/associated sxs/prior Treatment) HPI Comments: 38 year old otherwise healthy female presents with a complaint of right shoulder pain. This started 3 days ago, was gradual in onset, she awoke with pain in her shoulder and thought that she had slept on her shoulder wrong. The pain has been persistent, gradually worsening and is now severe. The pain radiates into her right chest, right back, right neck and is worse with moving the shoulder. She is holding her arm at her side and is virtually unable to move the arm because of severe shoulder pain. There was no other traumatic injury, no history of orthopedic surgery, no shortness of breath, cough, sore throat, nasal discharge, dysuria, diarrhea or rashes to the skin. She does report having a right finger redness and swelling of her right index finger that occurred several days prior to this starting but the pain and swelling in her finger has all but resolved.  Patient is a 38 y.o. female presenting with shoulder pain and fever. The history is provided by the patient.  Shoulder Pain  Fever   History reviewed. No pertinent past medical history. History reviewed. No pertinent past surgical history. No family history on file. History  Substance Use Topics  . Smoking status: Current Every Day Smoker  . Smokeless tobacco: Never Used  . Alcohol Use: No   OB History   Grav Para Term Preterm Abortions TAB SAB Ect Mult Living                 Review of Systems  Constitutional: Positive for fever.  All other systems reviewed and are negative.     Allergies  Review of patient's allergies indicates no known allergies.  Home Medications   Prior to Admission medications   Not on File   BP  94/56  Pulse 92  Temp(Src) 98.4 F (36.9 C) (Oral)  Resp 13  Ht 5\' 4"  (1.626 m)  Wt 151 lb (68.493 kg)  BMI 25.91 kg/m2  SpO2 97% Physical Exam  Nursing note and vitals reviewed. Constitutional: She appears well-developed and well-nourished. No distress.  HENT:  Head: Normocephalic and atraumatic.  Mouth/Throat: Oropharynx is clear and moist. No oropharyngeal exudate.  Eyes: Conjunctivae and EOM are normal. Pupils are equal, round, and reactive to light. Right eye exhibits no discharge. Left eye exhibits no discharge. No scleral icterus.  Neck: Normal range of motion. Neck supple. No JVD present. No thyromegaly present.  Cardiovascular: Regular rhythm, normal heart sounds and intact distal pulses.  Exam reveals no gallop and no friction rub.   No murmur heard. Tachycardia to 120  Pulmonary/Chest: Effort normal and breath sounds normal. No respiratory distress. She has no wheezes. She has no rales.  Abdominal: Soft. Bowel sounds are normal. She exhibits no distension and no mass. There is no tenderness.  Musculoskeletal: She exhibits tenderness. She exhibits no edema.  Dec ROM, severe pain in the R shoulder - no ROM due to severe pain, no obvious swelling or rash over joint.  No other joint pain or swelling. R index finger normal size, no infeciton, tendreness, redness or drainage  Lymphadenopathy:    She has no cervical adenopathy.  Neurological: She is alert. Coordination normal.  Skin: Skin is warm and dry. No rash  noted. No erythema.  Psychiatric: She has a normal mood and affect. Her behavior is normal.    ED Course  ARTHOCENTESIS Date/Time: 12/28/2013 2:49 AM Performed by: Eber Hong D Authorized by: Eber Hong D Consent: Verbal consent obtained. written consent obtained. Risks and benefits: risks, benefits and alternatives were discussed Consent given by: patient Patient understanding: patient states understanding of the procedure being performed Patient consent:  the patient's understanding of the procedure matches consent given Procedure consent: procedure consent matches procedure scheduled Required items: required blood products, implants, devices, and special equipment available Patient identity confirmed: verbally with patient Time out: Immediately prior to procedure a "time out" was called to verify the correct patient, procedure, equipment, support staff and site/side marked as required. Indications: possible septic joint and diagnostic evaluation  Body area: shoulder Joint: right shoulder Local anesthesia used: yes Local anesthetic: lidocaine 1% without epinephrine Anesthetic total: 8 ml Patient sedated: no Preparation: Patient was prepped and draped in the usual sterile fashion. Needle gauge: 18 G Ultrasound guidance: no Approach: posterior Aspirate amount: 0 ml   (including critical care time) Labs Review Labs Reviewed  CBC WITH DIFFERENTIAL - Abnormal; Notable for the following:    RBC 3.86 (*)    Hemoglobin 9.7 (*)    HCT 30.4 (*)    MCH 25.1 (*)    RDW 16.8 (*)    Platelets 125 (*)    All other components within normal limits  COMPREHENSIVE METABOLIC PANEL - Abnormal; Notable for the following:    Sodium 133 (*)    Chloride 93 (*)    Glucose, Bld 112 (*)    Calcium 8.1 (*)    Albumin 2.5 (*)    AST 41 (*)    ALT 148 (*)    Alkaline Phosphatase 146 (*)    Anion gap 18 (*)    All other components within normal limits  URINALYSIS, ROUTINE W REFLEX MICROSCOPIC - Abnormal; Notable for the following:    Color, Urine AMBER (*)    APPearance CLOUDY (*)    Hgb urine dipstick SMALL (*)    Bilirubin Urine SMALL (*)    Protein, ur 30 (*)    Nitrite POSITIVE (*)    Leukocytes, UA SMALL (*)    All other components within normal limits  URINE MICROSCOPIC-ADD ON - Abnormal; Notable for the following:    Bacteria, UA MANY (*)    Crystals CA OXALATE CRYSTALS (*)    All other components within normal limits  CULTURE, BLOOD  (ROUTINE X 2)  CULTURE, BLOOD (ROUTINE X 2)  SEDIMENTATION RATE  C-REACTIVE PROTEIN  ACETAMINOPHEN LEVEL  ETHANOL  I-STAT CG4 LACTIC ACID, ED    Imaging Review Dg Chest 1 View  12/28/2013   CLINICAL DATA:  Right shoulder and chest pain. Pain with deep inspiration.  EXAM: CHEST - 1 VIEW  COMPARISON:  None.  FINDINGS: The lungs are well-aerated. Mild bibasilar opacities may reflect atelectasis or possibly mild pneumonia. There is no evidence of pleural effusion or pneumothorax.  The cardiomediastinal silhouette is within normal limits. No acute osseous abnormalities are seen. The right glenohumeral joint is grossly unremarkable in appearance.  IMPRESSION: Mild bibasilar opacities may reflect atelectasis or possibly mild pneumonia.   Electronically Signed   By: Roanna Raider M.D.   On: 12/28/2013 01:21   Dg Shoulder Right  12/28/2013   CLINICAL DATA:  Right shoulder pain, radiating to neck and chest. Difficulty moving right shoulder.  EXAM: RIGHT SHOULDER - 2+ VIEW  COMPARISON:  None.  FINDINGS: There is no evidence of fracture or dislocation. The right humeral head is seated within the glenoid fossa. The acromioclavicular joint is unremarkable in appearance. No significant soft tissue abnormalities are seen. The visualized portions of the right lung are clear.  IMPRESSION: No evidence of fracture or dislocation.   Electronically Signed   By: Roanna RaiderJeffery  Chang M.D.   On: 12/28/2013 01:20     EKG Interpretation   Date/Time:  Sunday December 28 2013 00:18:59 EDT Ventricular Rate:  118 PR Interval:  121 QRS Duration: 89 QT Interval:  301 QTC Calculation: 422 R Axis:   82 Text Interpretation:  Sinus tachycardia Abnormal ekg No old tracing to  compare Confirmed by Milderd Manocchio  MD, Kaelynne Christley ([id number]) on 12/28/2013 2:18:19 AM      MDM   Final diagnoses:  Fever, unspecified fever cause  Shoulder pain, acute, right    The patient has signs of joint infection, no other complaints to suggest pneumonia, ear  infection or otherwise. Labs ordered including sedimentation rate, CRP and blood counts, well-hydrated analysis and chest x-ray. Anticipate arthrocentesis for definitive evaluation.  Chest x-ray and shoulder x-ray without acute findings, urinalysis with suggestion of infection, no leukocytosis. The patient will be admitted to the hospital for further evaluation of a septic workup. Her blood pressure is 95 systolic, her heart rate has improved to 90 with IV fluids, she will need admission to the hospital.  D/w Dr. Allena KatzPatel, will admit to tele,  Meds given in ED:  Medications  fentaNYL (SUBLIMAZE) injection 100 mcg (100 mcg Intravenous Given 12/28/13 0048)  lidocaine (PF) (XYLOCAINE) 1 % injection (not administered)  piperacillin-tazobactam (ZOSYN) IVPB 3.375 g (3.375 g Intravenous New Bag/Given 12/28/13 0242)  vancomycin (VANCOCIN) IVPB 1000 mg/200 mL premix (not administered)  oxyCODONE-acetaminophen (PERCOCET/ROXICET) 5-325 MG per tablet 2 tablet (2 tablets Oral Given 12/28/13 0030)  sodium chloride 0.9 % bolus 1,000 mL (1,000 mLs Intravenous New Bag/Given 12/28/13 0043)  sodium chloride 0.9 % bolus 1,000 mL (1,000 mLs Intravenous New Bag/Given 12/28/13 0243)  sodium chloride 0.9 % bolus 1,000 mL (1,000 mLs Intravenous New Bag/Given 12/28/13 0242)      Vida RollerBrian D Santi Troung, MD 12/28/13 [phone]

## 2013-12-28 NOTE — ED Notes (Signed)
Patient presents with c/o right shoulder pain that goes up into her neck and across into her chest

## 2013-12-28 NOTE — Progress Notes (Signed)
ANTIBIOTIC CONSULT NOTE - INITIAL  Pharmacy Consult for vancomycin Indication: rule out sepsis  No Known Allergies  Patient Measurements: Height: 5\' 4"  (162.6 cm) Weight: 151 lb (68.493 kg) IBW/kg (Calculated) : 54.7  Vital Signs: Temp: 98.4 F (36.9 C) (07/19 0237) Temp src: Oral (07/19 0237) BP: 88/56 mmHg (07/19 0315) Pulse Rate: 79 (07/19 0315)  Labs:  Recent Labs  12/28/13 0042  WBC 9.5  HGB 9.7*  PLT 125*  CREATININE 0.76   Estimated Creatinine Clearance: 91.5 ml/min (by C-G formula based on Cr of 0.76).    Medical History: History reviewed. No pertinent past medical history.  Medications:  No prescriptions prior to admission   Scheduled:  . cefTRIAXone (ROCEPHIN)  IV  1 g Intravenous Q24H  . doxycycline (VIBRAMYCIN) IV  100 mg Intravenous Q12H  . enoxaparin (LOVENOX) injection  40 mg Subcutaneous Q24H  . famotidine (PEPCID) IV  20 mg Intravenous Q12H  . lidocaine  1 patch Transdermal Q24H  . sodium chloride  3 mL Intravenous Q12H  . vancomycin  750 mg Intravenous Q8H   Infusions:  . sodium chloride      Assessment: 38yo female c/o shoulder pain x3d that has worsening in severity, found to be hypotensive w/ signs of joint infection, concern for sepsis, to begin IV ABX.  Goal of Therapy:  Vancomycin trough level 15-20 mcg/ml  Plan:  Rec'd vanc 1g IV in ED; will continue with vancomycin 750mg  IV Q8H and monitor CBC, Cx, levels prn.  Vernard GamblesVeronda Laurelin Elson, PharmD, BCPS  12/28/2013,5:01 AM

## 2013-12-29 ENCOUNTER — Encounter (HOSPITAL_COMMUNITY): Payer: Self-pay | Admitting: Physician Assistant

## 2013-12-29 DIAGNOSIS — M60009 Infective myositis, unspecified site: Secondary | ICD-10-CM

## 2013-12-29 DIAGNOSIS — I330 Acute and subacute infective endocarditis: Secondary | ICD-10-CM

## 2013-12-29 DIAGNOSIS — F17210 Nicotine dependence, cigarettes, uncomplicated: Secondary | ICD-10-CM | POA: Diagnosis present

## 2013-12-29 DIAGNOSIS — D509 Iron deficiency anemia, unspecified: Secondary | ICD-10-CM

## 2013-12-29 DIAGNOSIS — I369 Nonrheumatic tricuspid valve disorder, unspecified: Secondary | ICD-10-CM

## 2013-12-29 DIAGNOSIS — D649 Anemia, unspecified: Secondary | ICD-10-CM | POA: Diagnosis present

## 2013-12-29 DIAGNOSIS — D696 Thrombocytopenia, unspecified: Secondary | ICD-10-CM

## 2013-12-29 DIAGNOSIS — F1910 Other psychoactive substance abuse, uncomplicated: Secondary | ICD-10-CM

## 2013-12-29 HISTORY — DX: Acute and subacute infective endocarditis: I33.0

## 2013-12-29 LAB — HEPATITIS PANEL, ACUTE
HCV AB: REACTIVE — AB
HEP A IGM: NONREACTIVE
HEP B C IGM: NONREACTIVE
HEP B S AG: NEGATIVE

## 2013-12-29 LAB — COMPREHENSIVE METABOLIC PANEL
ALT: 75 U/L — ABNORMAL HIGH (ref 0–35)
AST: 16 U/L (ref 0–37)
Albumin: 2.1 g/dL — ABNORMAL LOW (ref 3.5–5.2)
Alkaline Phosphatase: 107 U/L (ref 39–117)
Anion gap: 13 (ref 5–15)
BUN: 8 mg/dL (ref 6–23)
CALCIUM: 8 mg/dL — AB (ref 8.4–10.5)
CHLORIDE: 104 meq/L (ref 96–112)
CO2: 23 meq/L (ref 19–32)
Creatinine, Ser: 0.62 mg/dL (ref 0.50–1.10)
GLUCOSE: 116 mg/dL — AB (ref 70–99)
Potassium: 3.3 mEq/L — ABNORMAL LOW (ref 3.7–5.3)
SODIUM: 140 meq/L (ref 137–147)
Total Bilirubin: 0.3 mg/dL (ref 0.3–1.2)
Total Protein: 5.9 g/dL — ABNORMAL LOW (ref 6.0–8.3)

## 2013-12-29 LAB — CBC WITH DIFFERENTIAL/PLATELET
BASOS ABS: 0 10*3/uL (ref 0.0–0.1)
BASOS PCT: 0 % (ref 0–1)
EOS ABS: 0 10*3/uL (ref 0.0–0.7)
EOS PCT: 1 % (ref 0–5)
HEMATOCRIT: 27.2 % — AB (ref 36.0–46.0)
Hemoglobin: 8.8 g/dL — ABNORMAL LOW (ref 12.0–15.0)
Lymphocytes Relative: 26 % (ref 12–46)
Lymphs Abs: 1.7 10*3/uL (ref 0.7–4.0)
MCH: 26 pg (ref 26.0–34.0)
MCHC: 32.4 g/dL (ref 30.0–36.0)
MCV: 80.2 fL (ref 78.0–100.0)
Monocytes Absolute: 0.6 10*3/uL (ref 0.1–1.0)
Monocytes Relative: 9 % (ref 3–12)
Neutro Abs: 4.1 10*3/uL (ref 1.7–7.7)
Neutrophils Relative %: 64 % (ref 43–77)
PLATELETS: 111 10*3/uL — AB (ref 150–400)
RBC: 3.39 MIL/uL — ABNORMAL LOW (ref 3.87–5.11)
RDW: 16.7 % — AB (ref 11.5–15.5)
WBC: 6.4 10*3/uL (ref 4.0–10.5)

## 2013-12-29 LAB — SYNOVIAL CELL COUNT + DIFF, W/ CRYSTALS
CRYSTALS FLUID: NONE SEEN
Eosinophils-Synovial: NONE SEEN % (ref 0–1)
LYMPHOCYTES-SYNOVIAL FLD: 14 % (ref 0–20)
MONOCYTE-MACROPHAGE-SYNOVIAL FLUID: 7 % — AB (ref 50–90)
Neutrophil, Synovial: 79 % — ABNORMAL HIGH (ref 0–25)
WBC, SYNOVIAL: UNDETERMINED /mm3 (ref 0–200)

## 2013-12-29 LAB — CK: Total CK: 48 U/L (ref 7–177)

## 2013-12-29 LAB — HIV ANTIBODY (ROUTINE TESTING W REFLEX): HIV: NONREACTIVE

## 2013-12-29 MED ORDER — FAMOTIDINE 20 MG PO TABS
20.0000 mg | ORAL_TABLET | Freq: Two times a day (BID) | ORAL | Status: DC
  Administered 2013-12-29 – 2014-01-02 (×8): 20 mg via ORAL
  Filled 2013-12-29 (×10): qty 1

## 2013-12-29 MED ORDER — FERROUS SULFATE 325 (65 FE) MG PO TABS
325.0000 mg | ORAL_TABLET | Freq: Every day | ORAL | Status: DC
  Administered 2013-12-30 – 2014-01-02 (×4): 325 mg via ORAL
  Filled 2013-12-29 (×5): qty 1

## 2013-12-29 MED ORDER — POTASSIUM CHLORIDE CRYS ER 20 MEQ PO TBCR
40.0000 meq | EXTENDED_RELEASE_TABLET | Freq: Once | ORAL | Status: AC
  Administered 2013-12-29: 40 meq via ORAL
  Filled 2013-12-29: qty 2

## 2013-12-29 MED ORDER — VANCOMYCIN HCL IN DEXTROSE 750-5 MG/150ML-% IV SOLN
750.0000 mg | Freq: Three times a day (TID) | INTRAVENOUS | Status: DC
  Administered 2013-12-29 – 2013-12-30 (×3): 750 mg via INTRAVENOUS
  Filled 2013-12-29 (×5): qty 150

## 2013-12-29 NOTE — Progress Notes (Signed)
Offered assistance with bath. Pt. Declined. Bath supplies given to Pt.

## 2013-12-29 NOTE — Consult Note (Signed)
[facility]    Date of Admission:  12/28/2013   Total days of antibiotics 2               Reason for Consult: Endocarditis    Referring Physician: Dr. Nani Skillernorrina Crane  Principal Problem:   Acute bacterial endocarditis Active Problems:   Bacterial myositis   IV drug abuse   Shock   Secondary amenorrhea   Anemia   Thrombocytopenia, unspecified   Cigarette smoker   . cefTRIAXone (ROCEPHIN)  IV  1 g Intravenous Q24H  . enoxaparin (LOVENOX) injection  40 mg Subcutaneous Q24H  . famotidine  20 mg Oral BID  . [START ON 12/30/2013] ferrous sulfate  325 mg Oral Q breakfast  . lidocaine  1 patch Transdermal Q24H  . potassium chloride  40 mEq Oral Once  . sodium chloride  3 mL Intravenous Q12H  . vancomycin  750 mg Intravenous Q8H    Recommendations: 1. Continue vancomycin pending final blood culture results 2. Discontinue ceftriaxone 3. Repeat blood cultures in a.m.  4. 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 I will treat her with IV vancomycin alone pending final identification and susceptibilities. Given her injecting drug use she is not a good candidate for outpatient IV antibiotics through a PICC. She may be a candidate for single dose oritavancin, a new antibiotic which can give therapeutic levels for up to one month. I will follow with you    HPI: Melanie Crane is a 38 y.o. female who began to notice some swelling and pain in her right index finger one week ago. This seemed to improve slightly then she began having right shoulder pain. She also began having sweats one week ago. He is not aware of any fever or has not had any chills. She was admitted and both blood cultures are growing gram-positive cocci in chains. MR of her right shoulder shows some fluid in the a.c. joint and  surrounding myositis without drainable abscess. 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 She admits to injecting Opana frequently over the past several months. Of note, one sister recently died of complications of endocarditis and hepatitis C.   Review of Systems: Constitutional: positive for sweats, negative for anorexia, chills, fevers and weight loss Eyes: negative Ears, nose, mouth, throat, and face: negative Respiratory: negative Cardiovascular: negative Gastrointestinal: negative Genitourinary:negative  Past Medical History  Diagnosis Date  . Anemia   . IVDU (intravenous drug user)     History  Substance Use Topics  . Smoking status: Current Every Day Smoker  . Smokeless tobacco: Never Used  . Alcohol Use: No    Family History  Problem Relation Age of Onset  . Hypertension Father   . Kidney failure Father   . Hyperlipidemia Mother   . CAD Mother    No Known Allergies  OBJECTIVE: Blood pressure 121/84, pulse 81, temperature 99.3 F (37.4 C), temperature source Oral, resp. rate 18, height 5\' 4"  (1.626 m), weight 70.2 kg (154 lb 12.2 oz), SpO2 100.00%. General: She is sitting up in a chair. She is in no distress Skin: Multiple scars on forearms. No splinter or conjunctival hemorrhages Lymph nodes: No palpable adenopathy Lungs: Clear Cor: Regular S1 and S2 with no murmurs Abdomen: Soft and nontender Joints extremities: Slight warmth and tenderness around  right shoulder. Right index finger slightly swollen. Some pain with flexion. No cellulitis.  Lab Results Lab Results  Component Value Date   WBC 6.4 12/29/2013   HGB 8.8* 12/29/2013   HCT 27.2* 12/29/2013   MCV 80.2 12/29/2013   PLT 111* 12/29/2013    Lab Results  Component Value Date   CREATININE 0.62 12/29/2013   BUN 8 12/29/2013   NA 140 12/29/2013   K 3.3* 12/29/2013   CL 104 12/29/2013   CO2 23 12/29/2013    Lab Results  Component Value Date   ALT 75*  12/29/2013   AST 16 12/29/2013   ALKPHOS 107 12/29/2013   BILITOT 0.3 12/29/2013     Microbiology: Recent Results (from the past 240 hour(s))  CULTURE, BLOOD (ROUTINE X 2)     Status: None   Collection Time    12/28/13 12:38 AM      Result Value Ref Range Status   Specimen Description BLOOD LEFT FOREARM   Final   Special Requests BOTTLES DRAWN AEROBIC AND ANAEROBIC [facility] EACH   Final   Culture  Setup Time     Final   Value: 12/28/2013 18:07     Performed at [facility]   Culture     Final   Value: GRAM POSITIVE COCCI IN CHAINS     Note: Gram Stain Report Called to,Read Back By and Verified With:  Melanie Crane @ 1217AM VINCJ     Performed at [facility]   Report Status PENDING   Incomplete  CULTURE, BLOOD (ROUTINE X 2)     Status: None   Collection Time    12/28/13 12:45 AM      Result Value Ref Range Status   Specimen Description BLOOD RIGHT ARM   Final   Special Requests BOTTLES DRAWN AEROBIC AND ANAEROBIC 10CC EACH   Final   Culture  Setup Time     Final   Value: 12/28/2013 18:07     Performed at [facility]   Culture     Final   Value: GRAM POSITIVE COCCI IN CHAINS     Note: CRITICAL RESULT CALLED TO, READ BACK BY AND VERIFIED WITH: Melanie Crane @ 1217AM VINCJ 12/29/13     Performed at [facility]   Report Status PENDING   Incomplete  MRSA PCR SCREENING     Status: None   Collection Time    12/28/13  4:44 AM      Result Value Ref Range Status   MRSA by PCR NEGATIVE  NEGATIVE Final   Comment:            The GeneXpert MRSA Assay (FDA     approved for NASAL specimens     only), is one component of a     comprehensive MRSA colonization     surveillance program. It is not     intended to diagnose MRSA     infection nor to guide or     monitor treatment for     MRSA infections.    Melanie Asters, MD [facility] for Infectious Disease [facility] [phone] pager   [phone] cell 12/29/2013, 2:05 PM

## 2013-12-29 NOTE — Progress Notes (Addendum)
Discussed with Mr Melanie Crane, ortho PA.   [facility] PROGRESS NOTE  Melanie Crane ZOX:[id number] DOB: 03/20/1976 DOA: 12/28/2013 PCP: No PCP Per Patient  Assessment/Plan:  Principal Problem:   Acute bacterial endocarditis: echo shows tricuspid vegetations. Admits to injecting opana for 2 months. Blood cultures growing GPC chains. Have consulted ID and cardiology for recommendations. Active Problems:   Shock resolved   Secondary amenorrhea   Bacterial myositis  MRI shows abnormal edema and enhancement in the distal trapezius, anterior deltoid, and supraspinatus muscle, and tracking along adjacent fat planes, below the clavicle, and also between the infraspinatus and posterior deltoid. Appearance suggests myositis and potentially fasciitis. No drainable abscess identified. No shoulder joint effusion.  [facility], indeterminate for infection on MRI, but likely septic joint given bacterial endocarditis. Have notified ortho.   IV drug abuse: Patient reports that she has been injecting Opana for 2 months. She has a long history of abusing oral opioids and was in prison for 5 years. Patient voices desire to quit using, but this will likely complicate her disposition. Will consult social work.  Patient does have an acute indication for pain medication, but will need to be weaned off eventually.  HIV and hepatitis panel pending.   Anemia: Likely iron deficiency and infection. Will give oral iron   Thrombocytopenia, unspecified  Code Status:  full Family Communication:   Disposition Plan:  ?  Consultants:  Orthopedics, Dr. Luiz BlareGraves  Infectious disease  Cardiology, [facility]  Procedures:     Antibiotics:  Per ID  HPI/Subjective: Shoulder pain slightly improved. Afraid of withdrawal from opioids. Has a sister who died of bacterial endocarditis.  Objective: Filed Vitals:   12/28/13 2219  BP: 121/84  Pulse: 81  Temp: 99.3 F (37.4 C)  Resp: 18     Intake/Output Summary (Last 24 hours) at 12/29/13 1310 Last data filed at 12/29/13 0916  Gross per 24 hour  Intake   1980 ml  Output      0 ml  Net   1980 ml   Filed Weights   12/28/13 0008 12/28/13 0430  Weight: 68.493 kg (151 lb) 70.2 kg (154 lb 12.2 oz)    Exam:   General:  In chair. Appears more comfortable today. Cooperative and pleasant. Occasionally tearful.  Cardiovascular: Regular rate rhythm with no appreciable murmurs gallops rubs  Respiratory: Clear to auscultation bilaterally without wheezes rhonchi or rales  Abdomen: Soft nontender nondistended  Ext: No edema. Able to move right arm a better today.  Basic Metabolic Panel:  Recent Labs Lab 12/28/13 0042 12/28/13 0546 12/29/13 1005  NA 133* 142 140  K 3.8 3.5* 3.3*  CL 93* 106 104  CO2 22 22 23   GLUCOSE 112* 107* 116*  BUN 16 14 8   CREATININE 0.76 0.72 0.62  CALCIUM 8.1* 7.7* 8.0*   Liver Function Tests:  Recent Labs Lab 12/28/13 0042 12/28/13 0546 12/29/13 1005  AST 41* 34 16  ALT 148* 113* 75*  ALKPHOS 146* 119* 107  BILITOT 0.6 0.6 0.3  PROT 6.6 5.6* 5.9*  ALBUMIN 2.5* 2.0* 2.1*   No results found for this basename: LIPASE, AMYLASE,  in the last 168 hours No results found for this basename: AMMONIA,  in the last 168 hours CBC:  Recent Labs Lab 12/28/13 0042 12/28/13 0546 12/29/13 1005  WBC 9.5 6.8 6.4  NEUTROABS 6.1 3.8 4.1  HGB 9.7* 9.0* 8.8*  HCT 30.4* 27.7* 27.2*  MCV 78.8 79.8 80.2  PLT  125* 103* 111*   Cardiac Enzymes:  Recent Labs Lab 12/29/13 1005  CKTOTAL 48   BNP (last 3 results) No results found for this basename: PROBNP,  in the last 8760 hours CBG: No results found for this basename: GLUCAP,  in the last 168 hours  Recent Results (from the past 240 hour(s))  CULTURE, BLOOD (ROUTINE X 2)     Status: None   Collection Time    12/28/13 12:38 AM      Result Value Ref Range Status   Specimen Description BLOOD LEFT FOREARM   Final   Special Requests  BOTTLES DRAWN AEROBIC AND ANAEROBIC [name] EACH   Final   Culture  Setup Time     Final   Value: 12/28/2013 18:07     Performed at [facility]   Culture     Final   Value: GRAM POSITIVE COCCI IN CHAINS     Note: Gram Stain Report Called to,Read Back By and Verified With:  JULIE P. RN @ 1217AM VINCJ     Performed at [facility]   Report Status PENDING   Incomplete  CULTURE, BLOOD (ROUTINE X 2)     Status: None   Collection Time    12/28/13 12:45 AM      Result Value Ref Range Status   Specimen Description BLOOD RIGHT ARM   Final   Special Requests BOTTLES DRAWN AEROBIC AND ANAEROBIC 10CC EACH   Final   Culture  Setup Time     Final   Value: 12/28/2013 18:07     Performed at [facility]   Culture     Final   Value: GRAM POSITIVE COCCI IN CHAINS     Note: CRITICAL RESULT CALLED TO, READ BACK BY AND VERIFIED WITH: JULIE P. RN @ 1217AM VINCJ 12/29/13     Performed at [facility]   Report Status PENDING   Incomplete  MRSA PCR SCREENING     Status: None   Collection Time    12/28/13  4:44 AM      Result Value Ref Range Status   MRSA by PCR NEGATIVE  NEGATIVE Final   Comment:            The GeneXpert MRSA Assay (FDA     approved for NASAL specimens     only), is one component of a     comprehensive MRSA colonization     surveillance program. It is not     intended to diagnose MRSA     infection nor to guide or     monitor treatment for     MRSA infections.     Studies: Dg Chest 1 View  12/28/2013   CLINICAL DATA:  Right shoulder and chest pain. Pain with deep inspiration.  EXAM: CHEST - 1 VIEW  COMPARISON:  None.  FINDINGS: The lungs are well-aerated. Mild bibasilar opacities may reflect atelectasis or possibly mild pneumonia. There is no evidence of pleural effusion or pneumothorax.  The cardiomediastinal silhouette is within normal limits. No acute osseous abnormalities are seen. The right glenohumeral joint is grossly unremarkable in appearance.   IMPRESSION: Mild bibasilar opacities may reflect atelectasis or possibly mild pneumonia.   Electronically Signed   By: Roanna Raider M.D.   On: 12/28/2013 01:21   Dg Shoulder Right  12/28/2013   CLINICAL DATA:  Right shoulder pain, radiating to neck and chest. Difficulty moving right shoulder.  EXAM: RIGHT SHOULDER - 2+ VIEW  COMPARISON:  None.  FINDINGS: There is no evidence of fracture or dislocation. The right humeral head is seated within the glenoid fossa. The acromioclavicular joint is unremarkable in appearance. No significant soft tissue abnormalities are seen. The visualized portions of the right lung are clear.  IMPRESSION: No evidence of fracture or dislocation.   Electronically Signed   By: Roanna Raider M.D.   On: 12/28/2013 01:20   US Abdomen Complete  12/28/2013   CLINICAL DATA:  Elevated LFTs.  EXAM: ULTRASOUND ABDOMEN COMPLETE  COMPARISON:  None.  FINDINGS: Gallbladder:  Relatively contracted. A 1.4 cm focus of increased echogenicity is noted at the gallbladder fundus, which may reflect an adenomatous polyp, tumefactive sludge or less likely a stone. No posterior acoustic shadowing is seen. No ultrasonographic Murphy's sign is elicited. No pericholecystic fluid is seen.  Common bile duct:  Diameter: 0.7 cm, borderline prominent, without evidence of distal obstruction.  Liver:  A small 1.3 cm focus of increased echogenicity within the left hepatic lobe may reflect a hemangioma. No additional mass is seen. Within normal limits in parenchymal echogenicity.  IVC:  No abnormality visualized.  Pancreas:  Visualized portion unremarkable.  Spleen:  Enlarged, measuring 16.7 cm in length, with a volume of 1240 mL.  Right Kidney:  Length: 14.4 cm. Echogenicity within normal limits. No mass or hydronephrosis visualized.  Left Kidney:  Length: 14.1 cm. Echogenicity within normal limits. No mass or hydronephrosis visualized.  Abdominal aorta:  No aneurysm visualized.  Other findings:  A 1.0 cm lymph node  adjacent to the pancreatic head is borderline normal in size.  IMPRESSION: 1. 1.3 cm focus of increased echogenicity within the left hepatic lobe is thought to reflect an hemangioma. Liver otherwise unremarkable in appearance. Would consider follow-up LFTs to ensure stability. 2. 1.4 cm focus of increased echogenicity noted at the gallbladder fundus. This may reflect an adenomatous polyp or tumefactive sludge. No posterior acoustic shadowing is seen, suggesting against a stone. No evidence for obstruction or cholecystitis. Endoscopic ultrasound could be considered for further evaluation, as deemed clinically appropriate. 3. Splenomegaly noted.   Electronically Signed   By: Roanna Raider M.D.   On: 12/28/2013 04:57   Mr Shoulder Right W Wo Contrast  12/28/2013   CLINICAL DATA:  Painful shoulder, query infection.  EXAM: MRI OF THE RIGHT SHOULDER WITHOUT AND WITH CONTRAST  TECHNIQUE: Multiplanar, multisequence MR imaging was performed both before and after administration of intravenous contrast.  CONTRAST:  15mL MULTIHANCE GADOBENATE DIMEGLUMINE 529 MG/ML IV SOLN  COMPARISON:  12/28/2013  FINDINGS: Larger than usual field-of-view images were obtained. Accelerated protocol head abuse due to motion artifact -the patient was unable to tolerate spending a lot of time in the magnet.  ROTATOR CUFF: Intact  MUSCLES: Abnormal edema within the distal trapezius, supraspinatus, anterior deltoid, and tracking along adjacent fat planes, below the clavicle, and also tracking between the infraspinatus and posterior deltoid. Abnormal associated enhancement noted.  BICEPS LONG HEAD: Intact  ACROMIOCLAVICULAR JOINT: Abnormal fluid in the [facility] joint with marginal enhancement. No definite osteomyelitis in the adjacent acromion and clavicle. No proximal humeral edema signal or abnormal enhancement.  GLENOHUMERAL JOINT:  No significant shoulder joint effusion.  LABRUM: Indeterminate due to large field of view, but no obvious tear seen.   BONES:  No compelling findings of osteomyelitis.  IMPRESSION: 1. Abnormal edema and enhancement in the distal trapezius, anterior deltoid, and supraspinatus muscle, and tracking along adjacent fat planes, below the clavicle, and also between the infraspinatus and posterior deltoid. Appearance suggests  myositis and potentially fasciitis. No drainable abscess identified. No shoulder joint effusion. 2. Small amount of fluid in the [facility] joint, indeterminate for infection, but without abnormal marrow enhancement the adjacent acromion or clavicle to further suggest septic AC joint.   Electronically Signed   By: Herbie Baltimore M.D.   On: 12/28/2013 19:33   Echo  - Left ventricle: The cavity size was normal. Systolic function was normal. The estimated ejection fraction was in the range of 55% to 60%. Wall motion was normal; there were no regional wall motion abnormalities. - Aortic valve: There was trivial regurgitation. - Mitral valve: There was trivial regurgitation. - Tricuspid valve: There are at least 2 large mobile shaggy density on the ventricular side of the tricuspid valve consistent with vegetation.  Scheduled Meds: . cefTRIAXone (ROCEPHIN)  IV  1 g Intravenous Q24H  . enoxaparin (LOVENOX) injection  40 mg Subcutaneous Q24H  . famotidine (PEPCID) IV  20 mg Intravenous Q12H  . [START ON 12/30/2013] ferrous sulfate  325 mg Oral Q breakfast  . lidocaine  1 patch Transdermal Q24H  . potassium chloride  40 mEq Oral Once  . sodium chloride  3 mL Intravenous Q12H  . vancomycin  750 mg Intravenous Q8H   Continuous Infusions: . sodium chloride 100 mL/hr at 12/28/13 0400    Time spent: 45 minutes  Christiane Ha, MD  [facility] Pager [phone]. If 7PM-7AM, please contact night-coverage at [email or web address], password [facility] 12/29/2013, 1:10 PM  LOS: 1 day  And

## 2013-12-29 NOTE — Progress Notes (Signed)
Procedure: Today under sterile conditions I aspirated 2 cc of bloody fluid from the right shoulder a.c. joint. I anesthetized her skin with 3 cc of 1% plain lidocaine. It did not appear purulent. Mostly blood. We will send this to the lab for cell count, Gram stain, aerobic and anaerobic cultures. Again no purulence was encountered and I did feel that I was in the a.c. joint.

## 2013-12-29 NOTE — Consult Note (Signed)
CARDIOLOGY CONSULT NOTE   Patient ID: Melanie Crane MRN: [id number], DOB/AGE: 38-22-1977   Admit date: 12/28/2013 Date of Consult: 12/29/2013   Primary Physician: No PCP Per Patient Primary Cardiologist: New  Pt. Profile  38 yo IV drug abuser presented with fever and R shoulder pain. Echo shows tricuspid valve vegetation. ID consulted  Problem List  PMH: Anemia IV drug abuse  Allergies  No Known Allergies  HPI   The patient is a 38 year old Caucasian female with past medical history of anemia, however no previous medical history of cardiac problem. Per patient, she has been injecting Opana into her left forearm for the past 2-3 month. She was also noted to have a new tattoo less than 6 months ago behind her right shoulder. She denies any other illicit drug use during this time. She has been having some right deltoid pain for the past 3 days. She was seen at an outside ED and obtained a joint x-ray which was negative and was sent home.  After her discharge, she continued to have R shoulder pain and eventually presented to [facility] ED on 12/28/2013. She denied any recent fever, chill, nausea, vomiting, chest pain, shortness breath, or cough. On arrival, she was noted to be tachycardic and hypotensive. She was admitted by internal medicine service and prophylactic antibiotic was initiated with blood culture. Initial joint x-ray of the right shoulder was negative. Chest x-ray was concerning for atelectasis versus mild pneumonia. Urinalysis was positive for bacteria and nitrite. MRI of her right shoulder was obtained on 12/28/2013 showed abnormal edema over distal trapezius, anterior deltoid, supraspinatus muscle. She also has small amount of fluid in the [facility] joint concerning for septic joint.  She was also noted to have elevated ALT however normal AST on arrival. Abdominal ultrasound was obtained which revealed a 1.3 focus echo tonicity concerning for hemangioma, 1.4 cm focused  echogenicity in the gallbladder which may be polyp versus sludge. Since admission, patient was noted to have a fever of 102. Echocardiogram was obtained on 12/29/2013 which showed EF 55-60%, normal wall motion, however it did reveal at least 2 large mobile density on the ventricular side of tricuspid valve consistent with vegetation. Cardiology and infectious disease was consulted.  Inpatient Medications  . enoxaparin (LOVENOX) injection  40 mg Subcutaneous Q24H  . famotidine  20 mg Oral BID  . [START ON 12/30/2013] ferrous sulfate  325 mg Oral Q breakfast  . lidocaine  1 patch Transdermal Q24H  . potassium chloride  40 mEq Oral Once  . sodium chloride  3 mL Intravenous Q12H  . vancomycin  750 mg Intravenous Q8H    Family History Family History  Problem Relation Age of Onset  . Hypertension Father   . Kidney failure Father   . Hyperlipidemia Mother   . CAD Mother      Social History History   Social History  . Marital Status: Divorced    Spouse Name: N/A    Number of Children: N/A  . Years of Education: N/A   Occupational History  . Not on file.   Social History Main Topics  . Smoking status: Current Every Day Smoker  . Smokeless tobacco: Never Used     Comment: 1 1/2 ppd  . Alcohol Use: No  . Drug Use: Yes     Comment: injectable opana in L forarm for 2-3 month  . Sexual Activity: Not on file   Other Topics Concern  . Not on file   Social  History Narrative  . No narrative on file     Review of Systems  General:  No chills, fever or weight changes. +sweat Cardiovascular:  No chest pain, dyspnea on exertion, edema, orthopnea, palpitations, paroxysmal nocturnal dyspnea. Dermatological: No rash, lesions/masses Respiratory: No cough, dyspnea Urologic: No hematuria, dysuria Abdominal:   No nausea, vomiting, diarrhea, bright red blood per rectum, melena, or hematemesis Neurologic:  No visual changes, wkns, changes in mental status. +R shoulder pain All other systems  reviewed and are otherwise negative except as noted above.  Physical Exam  Blood pressure 121/84, pulse 81, temperature 99.3 F (37.4 C), temperature source Oral, resp. rate 18, height 5\' 4"  (1.626 m), weight 154 lb 12.2 oz (70.2 kg), SpO2 100.00%.  General: Pleasant, NAD Psych: Normal affect. Neuro: Alert and oriented X 3. Moves all extremities spontaneously. R shoulder and index joint pain HEENT: Normal  Neck: Supple without bruits or JVD. Lungs:  Resp regular and unlabored, CTA. Heart: RRR no s3, s4, or murmurs. Abdomen: Soft, non-tender, non-distended, BS + x 4.  Extremities: No clubbing, cyanosis or edema. DP/PT/Radials 2+ and equal bilaterally.  Labs   Recent Labs  12/29/13 1005  CKTOTAL 48   Lab Results  Component Value Date   WBC 6.4 12/29/2013   HGB 8.8* 12/29/2013   HCT 27.2* 12/29/2013   MCV 80.2 12/29/2013   PLT 111* 12/29/2013     Recent Labs Lab 12/29/13 1005  NA 140  K 3.3*  CL 104  CO2 23  BUN 8  CREATININE 0.62  CALCIUM 8.0*  PROT 5.9*  BILITOT 0.3  ALKPHOS 107  ALT 75*  AST 16  GLUCOSE 116*    Radiology/Studies  Dg Chest 1 View  12/28/2013   CLINICAL DATA:  Right shoulder and chest pain. Pain with deep inspiration.  EXAM: CHEST - 1 VIEW  COMPARISON:  None.  FINDINGS: The lungs are well-aerated. Mild bibasilar opacities may reflect atelectasis or possibly mild pneumonia. There is no evidence of pleural effusion or pneumothorax.  The cardiomediastinal silhouette is within normal limits. No acute osseous abnormalities are seen. The right glenohumeral joint is grossly unremarkable in appearance.  IMPRESSION: Mild bibasilar opacities may reflect atelectasis or possibly mild pneumonia.   Electronically Signed   By: Roanna Raider M.D.   On: 12/28/2013 01:21   Dg Shoulder Right  12/28/2013   CLINICAL DATA:  Right shoulder pain, radiating to neck and chest. Difficulty moving right shoulder.  EXAM: RIGHT SHOULDER - 2+ VIEW  COMPARISON:  None.  FINDINGS:  There is no evidence of fracture or dislocation. The right humeral head is seated within the glenoid fossa. The acromioclavicular joint is unremarkable in appearance. No significant soft tissue abnormalities are seen. The visualized portions of the right lung are clear.  IMPRESSION: No evidence of fracture or dislocation.   Electronically Signed   By: Roanna Raider M.D.   On: 12/28/2013 01:20   US Abdomen Complete  12/28/2013   CLINICAL DATA:  Elevated LFTs.  EXAM: ULTRASOUND ABDOMEN COMPLETE  COMPARISON:  None.  FINDINGS: Gallbladder:  Relatively contracted. A 1.4 cm focus of increased echogenicity is noted at the gallbladder fundus, which may reflect an adenomatous polyp, tumefactive sludge or less likely a stone. No posterior acoustic shadowing is seen. No ultrasonographic Murphy's sign is elicited. No pericholecystic fluid is seen.  Common bile duct:  Diameter: 0.7 cm, borderline prominent, without evidence of distal obstruction.  Liver:  A small 1.3 cm focus of increased echogenicity within the left  hepatic lobe may reflect a hemangioma. No additional mass is seen. Within normal limits in parenchymal echogenicity.  IVC:  No abnormality visualized.  Pancreas:  Visualized portion unremarkable.  Spleen:  Enlarged, measuring 16.7 cm in length, with a volume of 1240 mL.  Right Kidney:  Length: 14.4 cm. Echogenicity within normal limits. No mass or hydronephrosis visualized.  Left Kidney:  Length: 14.1 cm. Echogenicity within normal limits. No mass or hydronephrosis visualized.  Abdominal aorta:  No aneurysm visualized.  Other findings:  A 1.0 cm lymph node adjacent to the pancreatic head is borderline normal in size.  IMPRESSION: 1. 1.3 cm focus of increased echogenicity within the left hepatic lobe is thought to reflect an hemangioma. Liver otherwise unremarkable in appearance. Would consider follow-up LFTs to ensure stability. 2. 1.4 cm focus of increased echogenicity noted at the gallbladder fundus. This may  reflect an adenomatous polyp or tumefactive sludge. No posterior acoustic shadowing is seen, suggesting against a stone. No evidence for obstruction or cholecystitis. Endoscopic ultrasound could be considered for further evaluation, as deemed clinically appropriate. 3. Splenomegaly noted.   Electronically Signed   By: Roanna RaiderJeffery  Chang M.D.   On: 12/28/2013 04:57   Mr Shoulder Right W Wo Contrast  12/28/2013   CLINICAL DATA:  Painful shoulder, query infection.  EXAM: MRI OF THE RIGHT SHOULDER WITHOUT AND WITH CONTRAST  TECHNIQUE: Multiplanar, multisequence MR imaging was performed both before and after administration of intravenous contrast.  CONTRAST:  15mL MULTIHANCE GADOBENATE DIMEGLUMINE 529 MG/ML IV SOLN  COMPARISON:  12/28/2013  FINDINGS: Larger than usual field-of-view images were obtained. Accelerated protocol head abuse due to motion artifact -the patient was unable to tolerate spending a lot of time in the magnet.  ROTATOR CUFF: Intact  MUSCLES: Abnormal edema within the distal trapezius, supraspinatus, anterior deltoid, and tracking along adjacent fat planes, below the clavicle, and also tracking between the infraspinatus and posterior deltoid. Abnormal associated enhancement noted.  BICEPS LONG HEAD: Intact  ACROMIOCLAVICULAR JOINT: Abnormal fluid in the [facility] with marginal enhancement. No definite osteomyelitis in the adjacent acromion and clavicle. No proximal humeral edema signal or abnormal enhancement.  GLENOHUMERAL JOINT:  No significant shoulder joint effusion.  LABRUM: Indeterminate due to large field of view, but no obvious tear seen.  BONES:  No compelling findings of osteomyelitis.  IMPRESSION: 1. Abnormal edema and enhancement in the distal trapezius, anterior deltoid, and supraspinatus muscle, and tracking along adjacent fat planes, below the clavicle, and also between the infraspinatus and posterior deltoid. Appearance suggests myositis and potentially fasciitis. No drainable abscess  identified. No shoulder joint effusion. 2. Small amount of fluid in the [facility] joint, indeterminate for infection, but without abnormal marrow enhancement the adjacent acromion or clavicle to further suggest septic AC joint.   Electronically Signed   By: Herbie BaltimoreWalt  Liebkemann M.D.   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 Elease HashimotoNahser reviewed with TTE result, correction to echo report, vegetation on atrial side (not ventricular side)  2. IV drug abuse 3. Iron deficiency anemia 4. Hypotension - resolved  - due to sepsis and endocarditis  Signed, Azalee CourseMeng, Hao, PA-C 12/29/2013, 2:16 PM  Attending Note:   The patient was seen and examined.  Agree with assessment and plan as noted above.  Changes made to the above note as needed.  Melanie Crane is a 38 yo with long hx of narcotic abuse, 2 months of IV narcotic abuse. Now presents with tricuspid valve endocarditis.  No CP or dyspnea.  Presented with right shoulder surgery which also seems to be due to a septic joint.   The vegetation is complex and very large.  Appears to be 2 vegetations.  The valve has trace - mild TR and seems to be relatively intact for the size of the vegetations.    I have placed a consult for TCTS for consideration of removal of the vegetations since they are at risk of embolization due to their size.   She will need to sterilize her blood first.    the valve appears to be intact and may be salvageable  I have stressed the importance of not using IV drugs again as she is at high risk for re-infection .  She understands and says that she will not be using IV drugs again.       Vesta Mixer, Montez Hageman., MD, [facility] 12/29/2013, 2:39 PM

## 2013-12-29 NOTE — Progress Notes (Signed)
Subjective: Patient still complains of right shoulder pain.  Maybe 20% improvement.  She reports that she "shot up "Opana in her left arm  The day before onset of right shoulder pain.  Objective: Vital signs in last 24 hours: Temp:  [98 F (36.7 C)-99.3 F (37.4 C)] 99.3 F (37.4 C) (07/19 2219) Pulse Rate:  [79-90] 81 (07/19 2219) Resp:  [18] 18 (07/19 2219) BP: (121-137)/(84-96) 121/84 mmHg (07/19 2219) SpO2:  [100 %] 100 % (07/19 2219)  Intake/Output from previous day: 07/19 0701 - 07/20 0700 In: 1740 [P.O.:240; I.V.:1200; IV Piggyback:300] Out: 750 [Urine:750] Intake/Output this shift: Total I/O In: 240 [P.O.:240] Out: -    Recent Labs  12/28/13 0042 12/28/13 0546  HGB 9.7* 9.0*    Recent Labs  12/28/13 0042 12/28/13 0546  WBC 9.5 6.8  RBC 3.86* 3.47*  3.47*  HCT 30.4* 27.7*  PLT 125* 103*    Recent Labs  12/28/13 0042 12/28/13 0546  NA 133* 142  K 3.8 3.5*  CL 93* 106  CO2 22 22  BUN 16 14  CREATININE 0.76 0.72  GLUCOSE 112* 107*  CALCIUM 8.1* 7.7*    Recent Labs  12/28/13 0546  INR 1.25  CLINICAL DATA: Painful shoulder, query infection.  EXAM:  MRI OF THE RIGHT SHOULDER WITHOUT AND WITH CONTRAST  TECHNIQUE:  Multiplanar, multisequence MR imaging was performed both [facility]man[facility] CenterMarland[facility] Surgery Center LLCMarlanPain Treatment [facility] D[facility] Matrix Surgery CenterMarlanMayfield Spine Surgery Center LLCMarlandVibra Hospital Of Mahoning ValleyMarlandSouth Hot Spring Endoscopy Center NortheastMarlandSylvan Surgery Center IncMarlandAdventist Health Tulare Regional MedicaMarlaMclean SoutheastMarlaLauderdale Community HospitalMarlSamaritan Lebanon Community HospitalMarland Candler HospitalMarlandMercury Surgery CenterMarlandCook Children'S Medical CenterMarlMary Imogene Bassett HospitalMarlanFall River Health ServicesMarlandIntegrity Transitional HospitalMarlaOcala Fl Orthopaedic Asc LLCMarlandDrake Center IncMarlandTmc HealthcareMarland Kitche52841iIrving BurtMalaAndi DGarment/HAce [id number]aAndi DGarment/HAce [id number] [id number] han usual field-of-view images were obtained. Accelerated  protocol head abuse due to motion artifact -the patient was unable  to tolerate spending a lot of time in the magnet.  ROTATOR CUFF: Intact  MUSCLES: Abnormal edema within the distal trapezius, supraspinatus,  anterior deltoid, and tracking along adjacent fat planes, below the  clavicle, and also tracking between the infraspinatus and posterior  deltoid. Abnormal associated enhancement noted.  BICEPS LONG HEAD: Intact  ACROMIOCLAVICULAR JOINT:  Abnormal fluid in the AC joint with  marginal enhancement. No definite osteomyelitis in the adjacent  acromion and clavicle. No proximal humeral edema signal or abnormal  enhancement.  GLENOHUMERAL JOINT: No significant shoulder joint effusion.  LABRUM: Indeterminate due to large field of view, but no obvious  tear seen.  BONES: No compelling findings of osteomyelitis.  IMPRESSION:  1. Abnormal edema and enhancement in the distal trapezius, anterior  deltoid, and supraspinatus muscle, and tracking along adjacent fat  planes, below the clavicle, and also between the infraspinatus and  posterior deltoid. Appearance suggests myositis and potentially  fasciitis. No drainable abscess identified. No shoulder joint  effusion.  2. Small amount of fluid in the AC joint, indeterminate for  infection, but without abnormal marrow enhancement the adjacent  acromion or clavicle to further suggest septic AC joint.  Electronically Signed  By: Walt Liebkemann M.D.  On: 12/28/2013 19:33  Right shoulder exam: No redness.  Minimal swelling.  She is locally tender over the a.c. Joint.  I can forward flex her passively to 90.  She is able to initiate abduction with good rotator cuff strength.   Assessment/Plan: right shoulder pain with fluid in the a.c. Joint with inflammation/myositis in the deltoid and supraspinatus.  Could be related to IV drug use. Plan: At this point I don't think there is enough fluid in her shoulder to aspirate although we could consider it.  I would have her continue on IV antibiotics and watch her along.  If she develops a  fluid collection over the a.c. Joint I would aspirate it.  At this point there is no need for surgical intervention. We will follow along.   Suzanne Kho G 12/29/2013, 9:40 AM

## 2013-12-29 NOTE — Progress Notes (Signed)
Echo Lab  2D Echocardiogram completed.  Matalynn Graff L Vivyan Biggers, RDCS 12/29/2013 10:57 AM

## 2013-12-30 ENCOUNTER — Encounter (HOSPITAL_COMMUNITY): Payer: Self-pay | Admitting: Thoracic Surgery (Cardiothoracic Vascular Surgery)

## 2013-12-30 DIAGNOSIS — B1920 Unspecified viral hepatitis C without hepatic coma: Secondary | ICD-10-CM | POA: Diagnosis present

## 2013-12-30 DIAGNOSIS — E876 Hypokalemia: Secondary | ICD-10-CM

## 2013-12-30 DIAGNOSIS — B951 Streptococcus, group B, as the cause of diseases classified elsewhere: Secondary | ICD-10-CM

## 2013-12-30 DIAGNOSIS — N390 Urinary tract infection, site not specified: Secondary | ICD-10-CM | POA: Diagnosis present

## 2013-12-30 DIAGNOSIS — I330 Acute and subacute infective endocarditis: Secondary | ICD-10-CM

## 2013-12-30 DIAGNOSIS — R894 Abnormal immunological findings in specimens from other organs, systems and tissues: Secondary | ICD-10-CM

## 2013-12-30 LAB — RAPID URINE DRUG SCREEN, HOSP PERFORMED
AMPHETAMINES: NOT DETECTED
Barbiturates: NOT DETECTED
Benzodiazepines: NOT DETECTED
Cocaine: NOT DETECTED
OPIATES: NOT DETECTED
Tetrahydrocannabinol: NOT DETECTED

## 2013-12-30 MED ORDER — POTASSIUM CHLORIDE CRYS ER 20 MEQ PO TBCR
40.0000 meq | EXTENDED_RELEASE_TABLET | Freq: Once | ORAL | Status: AC
  Administered 2013-12-30: 40 meq via ORAL
  Filled 2013-12-30: qty 2

## 2013-12-30 MED ORDER — DEXTROSE 5 % IV SOLN
2.0000 g | INTRAVENOUS | Status: DC
  Administered 2013-12-30 – 2013-12-31 (×2): 2 g via INTRAVENOUS
  Filled 2013-12-30 (×3): qty 2

## 2013-12-30 NOTE — Progress Notes (Signed)
Patient ID: Melanie Crane, female   DOB: 02/27/1976, 38 y.o.   MRN: [id number]         [facility]    Date of Admission:  12/28/2013           Day 3 vancomycin Principal Problem:   Acute bacterial endocarditis - right sided Active Problems:   Bacterial myositis   IV drug abuse   Secondary amenorrhea   Anemia   Thrombocytopenia, unspecified   Cigarette smoker   Hepatitis C antibody test positive   Hypokalemia   UTI (urinary tract infection)   . enoxaparin (LOVENOX) injection  40 mg Subcutaneous Q24H  . famotidine  20 mg Oral BID  . ferrous sulfate  325 mg Oral Q breakfast  . lidocaine  1 patch Transdermal Q24H  . sodium chloride  3 mL Intravenous Q12H  . vancomycin  750 mg Intravenous Q8H    Subjective: She states that her pain is unchanged. He says that she only started injecting drugs about 2 months ago.  Objective: Temp:  [97.6 F (36.4 C)-99.1 F (37.3 C)] 98.5 F (36.9 C) (07/21 1319) Pulse Rate:  [74-89] 89 (07/21 1319) Resp:  [16-20] 16 (07/21 1319) BP: (118-136)/(79-90) 128/81 mmHg (07/21 1319) SpO2:  [97 %-100 %] 100 % (07/21 1319)  General: She is in no distress. She is tearful during the exam talking about her injecting drug use Skin: No rash Lungs: Clear Cor: Regular S1 and S2 with no murmurs Abdomen: Soft and nontender Joints and extremities: Right shoulder is bandaged. Minimal diffuse swelling of right index finger without cellulitis  Lab Results Lab Results  Component Value Date   WBC 6.4 12/29/2013   HGB 8.8* 12/29/2013   HCT 27.2* 12/29/2013   MCV 80.2 12/29/2013   PLT 111* 12/29/2013    Lab Results  Component Value Date   CREATININE 0.62 12/29/2013   BUN 8 12/29/2013   NA 140 12/29/2013   K 3.3* 12/29/2013   CL 104 12/29/2013   CO2 23 12/29/2013    Lab Results  Component Value Date   ALT 75* 12/29/2013   AST 16 12/29/2013   ALKPHOS 107 12/29/2013   BILITOT 0.3 12/29/2013      Microbiology: Recent Results  (from the past 240 hour(s))  CULTURE, BLOOD (ROUTINE X 2)     Status: None   Collection Time    12/28/13 12:38 AM      Result Value Ref Range Status   Specimen Description BLOOD LEFT FOREARM   Final   Special Requests BOTTLES DRAWN AEROBIC AND ANAEROBIC [facility] EACH   Final   Culture  Setup Time     Final   Value: 12/28/2013 18:07     Performed at [facility]   Culture     Final   Value: GROUP B STREP(S.AGALACTIAE)ISOLATED     Note: Gram Stain Report Called to,Read Back By and Verified With:  JULIE P. RN @ 1217AM VINCJ     Performed at [facility]   Report Status PENDING   Incomplete  CULTURE, BLOOD (ROUTINE X 2)     Status: None   Collection Time    12/28/13 12:45 AM      Result Value Ref Range Status   Specimen Description BLOOD RIGHT ARM   Final   Special Requests BOTTLES DRAWN AEROBIC AND ANAEROBIC 10CC EACH   Final   Culture  Setup Time     Final   Value: 12/28/2013 18:07  Performed at [facility]     Final   Value: GROUP B STREP(S.AGALACTIAE)ISOLATED     Note: CRITICAL RESULT CALLED TO, READ BACK BY AND VERIFIED WITH: JULIE P. RN @ 1217AM VINCJ 12/29/13     Performed at [facility]   Report Status PENDING   Incomplete  MRSA PCR SCREENING     Status: None   Collection Time    12/28/13  4:44 AM      Result Value Ref Range Status   MRSA by PCR NEGATIVE  NEGATIVE Final   Comment:            The GeneXpert MRSA Assay (FDA     approved for NASAL specimens     only), is one component of a     comprehensive MRSA colonization     surveillance program. It is not     intended to diagnose MRSA     infection nor to guide or     monitor treatment for     MRSA infections.  BODY FLUID CULTURE     Status: None   Collection Time    12/29/13  3:33 PM      Result Value Ref Range Status   Specimen Description FLUID SYNOVIAL RIGHT SHOULDER   Final   Special Requests NONE   Final   Gram Stain     Final   Value: RARE WBC PRESENT,  PREDOMINANTLY MONONUCLEAR     NO ORGANISMS SEEN     Performed at [facility]   Culture     Final   Value: NO GROWTH 1 DAY     Performed at [facility]   Report Status PENDING   Incomplete  ANAEROBIC CULTURE     Status: None   Collection Time    12/29/13  3:33 PM      Result Value Ref Range Status   Specimen Description FLUID SYNOVIAL RIGHT SHOULDER   Final   Special Requests NONE   Final   Gram Stain     Final   Value: RARE WBC PRESENT, PREDOMINANTLY MONONUCLEAR     NO SQUAMOUS EPITHELIAL CELLS SEEN     NO ORGANISMS SEEN     Performed at [facility]   Culture     Final   Value: NO ANAEROBES ISOLATED; CULTURE IN PROGRESS FOR 5 DAYS     Performed at [facility]   Report Status PENDING   Incomplete    Studies/Results: Mr Shoulder Right W Wo Contrast  12/28/2013   CLINICAL DATA:  Painful shoulder, query infection.  EXAM: MRI OF THE RIGHT SHOULDER WITHOUT AND WITH CONTRAST  TECHNIQUE: Multiplanar, multisequence MR imaging was performed both before and after administration of intravenous contrast.  CONTRAST:  15mL MULTIHANCE GADOBENATE DIMEGLUMINE 529 MG/ML IV SOLN  COMPARISON:  12/28/2013  FINDINGS: Larger than usual field-of-view images were obtained. Accelerated protocol head abuse due to motion artifact -the patient was unable to tolerate spending a lot of time in the magnet.  ROTATOR CUFF: Intact  MUSCLES: Abnormal edema within the distal trapezius, supraspinatus, anterior deltoid, and tracking along adjacent fat planes, below the clavicle, and also tracking between the infraspinatus and posterior deltoid. Abnormal associated enhancement noted.  BICEPS LONG HEAD: Intact  ACROMIOCLAVICULAR JOINT: Abnormal fluid in the [facility] joint with marginal enhancement. No definite osteomyelitis in the adjacent acromion and clavicle. No proximal humeral edema signal or abnormal enhancement.  GLENOHUMERAL JOINT:  No significant shoulder joint effusion.  LABRUM: Indeterminate  due to large field of view, but no obvious tear seen.  BONES:  No compelling findings of osteomyelitis.  IMPRESSION: 1. Abnormal edema and enhancement in the distal trapezius, anterior deltoid, and supraspinatus muscle, and tracking along adjacent fat planes, below the clavicle, and also between the infraspinatus and posterior deltoid. Appearance suggests myositis and potentially fasciitis. No drainable abscess identified. No shoulder joint effusion. 2. Small amount of fluid in the [facility] joint, indeterminate for infection, but without abnormal marrow enhancement the adjacent acromion or clavicle to further suggest septic AC joint.   Electronically Signed   By: Herbie BaltimoreWalt  Liebkemann M.D.   On: 12/28/2013 19:33    Assessment: She has group B streptococcal tricuspid valve endocarditis and probable seeding of her right shoulder from her bacteremia. She has no clinical or radiographic evidence of septic pulmonary emboli although she is certainly at risk for this. I favor continued medical therapy for her infection. I do not feel strongly that she needs a long CT scan or TEE. I will change her vancomycin to ceftriaxone while she is here then give her a single dose of oritavancin, a new antibiotic with a very long half-life giving therapeutic levels for approximately 3 weeks against strep and staph. She is not a good candidate for a PICC and outpatient IV antibiotics because of her injecting drug use.  Plan: 1. Change vancomycin to ceftriaxone 2. Await results of repeat blood cultures and right shoulder cultures  Cliffton AstersJohn Breindy Meadow, MD [facility] [phone] pager   [phone] cell 12/30/2013, 2:49 PM

## 2013-12-30 NOTE — Progress Notes (Signed)
    Subjective:  C/O pain Rt shoulder.  Objective:  Vital Signs in the last 24 hours: Temp:  [97.6 F (36.4 C)-99.1 F (37.3 C)] 98 F (36.7 C) (07/21 0656) Pulse Rate:  [74-81] 81 (07/21 0656) Resp:  [16-20] 16 (07/21 0656) BP: (118-136)/(79-90) 136/90 mmHg (07/21 0656) SpO2:  [97 %-98 %] 98 % (07/21 0656)  Intake/Output from previous day:  Intake/Output Summary (Last 24 hours) at 12/30/13 1141 Last data filed at 12/30/13 0100  Gross per 24 hour  Intake    750 ml  Output      0 ml  Net    750 ml    Physical Exam: General appearance: alert, cooperative and mild distress Lungs: clear to auscultation bilaterally Heart: regular rate and rhythm   Rate: 82  Rhythm: normal sinus rhythm  Lab Results:  Recent Labs  12/28/13 0546 12/29/13 1005  WBC 6.8 6.4  HGB 9.0* 8.8*  PLT 103* 111*    Recent Labs  12/28/13 0546 12/29/13 1005  NA 142 140  K 3.5* 3.3*  CL 106 104  CO2 22 23  GLUCOSE 107* 116*  BUN 14 8  CREATININE 0.72 0.62   No results found for this basename: TROPONINI, CK, MB,  in the last 72 hours  Recent Labs  12/28/13 0546  INR 1.25    Imaging: Imaging results have been reviewed  Cardiac Studies:  Assessment/Plan:  38 yo IV drug abuser presented with fever and R shoulder pain. Echo shows tricuspid valve vegetation.     Principal Problem:   Acute bacterial endocarditis - right sided Active Problems:   Bacterial myositis   Secondary amenorrhea   IV drug abuse   Anemia   Thrombocytopenia, unspecified   Cigarette smoker   Hepatitis C antibody test positive   Hypokalemia   UTI (urinary tract infection)    PLAN: Pt says she spoke with surgeon this am- plan is for medical Rx.   Corine ShelterLuke Kilroy PA-C Beeper [phone] 12/30/2013, 11:41 AM  History and all data above reviewed.  Patient examined.  I agree with the findings as above. Shoulder pain as mentioned. The patient exam reveals COR:RRR  ,  Lungs: Clear,  Abd: Positive bowel sounds,  no rebound no guarding, Ext No edema  .  All available labs, radiology testing, previous records reviewed. Agree with documented assessment and plan. SBE:  At this point she is to have medical management.  Surgery not planned without absolute indication including failure to respond to medications or evidence of left sided embolic events or abscess.  She does not have this clinically.  I would suggest TEE only if we suspected this and results would alter the management.   Fayrene FearingJames Idolina Mantell  2:25 PM  12/30/2013

## 2013-12-30 NOTE — Progress Notes (Addendum)
[facility] PROGRESS NOTE  Sharay Bellissimo ZOX:[id number] DOB: 05-08-1976 DOA: 12/28/2013 PCP: No PCP Per Patient  Assessment/Plan:  Principal Problem:   Acute bacterial endocarditis: echo shows tricuspid vegetations. Admits to injecting opana for 2 months. Blood cultures grew GBS. abx per ID. Cardiology has consulted CT surgery. Their consult is pending. Active Problems:   Shock resolved   Secondary amenorrhea   Bacterial myositis: pain slowly improving Small amount of fluid in the [facility] joint: aspirated by ortho. Mainly clot, but culture pending.   IV drug abuse: Patient reports that she has been injecting Opana for 2 months. She has a long history of abusing oral opioids and was in prison for 5 years. Patient voices desire to quit using, but this will likely complicate her disposition.  social work consult pending for drug treatment options.  Patient does have an acute indication for pain medication, but will need to be weaned off eventually.  HIV negative Hep C ab positive. Not a candidate for treatment at this time so no need for further workup   Anemia: Likely iron deficiency and infection. oral iron started   Thrombocytopenia, unspecified Hypokalemia: recheck in am UTI: no cx done. Will repeat UA and order cx  Code Status:  full Family Communication:   Disposition Plan:  ?  Consultants:  Orthopedics, Dr. Luiz Blare  Infectious disease  Cardiology, [facility]  [facility]  Procedures:   Aspiration right AC joint  Antibiotics:  Per ID  HPI/Subjective: Shoulder pain slightly improved. Wondering when she can go home  Objective: Filed Vitals:   12/30/13 0656  BP: 136/90  Pulse: 81  Temp: 98 F (36.7 C)  Resp: 16    Intake/Output Summary (Last 24 hours) at 12/30/13 0944 Last data filed at 12/30/13 0100  Gross per 24 hour  Intake    750 ml  Output      0 ml  Net    750 ml   Filed Weights   12/28/13 0008 12/28/13 0430  Weight: 68.493 kg (151 lb) 70.2 kg (154 lb  12.2 oz)    Exam:   General:  In bed. Appears depressed. cooperative  Cardiovascular: Regular rate rhythm with no appreciable murmurs gallops rubs  Respiratory: Clear to auscultation bilaterally without wheezes rhonchi or rales  Abdomen: Soft nontender nondistended  Ext: No edema. Able to move right arm a better today.  Basic Metabolic Panel:  Recent Labs Lab 12/28/13 0042 12/28/13 0546 12/29/13 1005  NA 133* 142 140  K 3.8 3.5* 3.3*  CL 93* 106 104  CO2 22 22 23   GLUCOSE 112* 107* 116*  BUN 16 14 8   CREATININE 0.76 0.72 0.62  CALCIUM 8.1* 7.7* 8.0*   Liver Function Tests:  Recent Labs Lab 12/28/13 0042 12/28/13 0546 12/29/13 1005  AST 41* 34 16  ALT 148* 113* 75*  ALKPHOS 146* 119* 107  BILITOT 0.6 0.6 0.3  PROT 6.6 5.6* 5.9*  ALBUMIN 2.5* 2.0* 2.1*   No results found for this basename: LIPASE, AMYLASE,  in the last 168 hours No results found for this basename: AMMONIA,  in the last 168 hours CBC:  Recent Labs Lab 12/28/13 0042 12/28/13 0546 12/29/13 1005  WBC 9.5 6.8 6.4  NEUTROABS 6.1 3.8 4.1  HGB 9.7* 9.0* 8.8*  HCT 30.4* 27.7* 27.2*  MCV 78.8 79.8 80.2  PLT 125* 103* 111*   Cardiac Enzymes:  Recent Labs Lab 12/29/13 1005  CKTOTAL 48   BNP (last 3 results) No results found for this basename: PROBNP,  in the last 8760 hours CBG: No results found for this basename: GLUCAP,  in the last 168 hours  Recent Results (from the past 240 hour(s))  CULTURE, BLOOD (ROUTINE X 2)     Status: None   Collection Time    12/28/13 12:38 AM      Result Value Ref Range Status   Specimen Description BLOOD LEFT FOREARM   Final   Special Requests BOTTLES DRAWN AEROBIC AND ANAEROBIC [facility] EACH   Final   Culture  Setup Time     Final   Value: 12/28/2013 18:07     Performed at [facility]   Culture     Final   Value: GROUP B STREP(S.AGALACTIAE)ISOLATED     Note: Gram Stain Report Called to,Read Back By and Verified With:  JULIE P. RN @ 1217AM  VINCJ     Performed at [facility]   Report Status PENDING   Incomplete  CULTURE, BLOOD (ROUTINE X 2)     Status: None   Collection Time    12/28/13 12:45 AM      Result Value Ref Range Status   Specimen Description BLOOD RIGHT ARM   Final   Special Requests BOTTLES DRAWN AEROBIC AND ANAEROBIC 10CC EACH   Final   Culture  Setup Time     Final   Value: 12/28/2013 18:07     Performed at [facility]   Culture     Final   Value: GROUP B STREP(S.AGALACTIAE)ISOLATED     Note: CRITICAL RESULT CALLED TO, READ BACK BY AND VERIFIED WITH: JULIE P. RN @ 1217AM VINCJ 12/29/13     Performed at [facility]   Report Status PENDING   Incomplete  MRSA PCR SCREENING     Status: None   Collection Time    12/28/13  4:44 AM      Result Value Ref Range Status   MRSA by PCR NEGATIVE  NEGATIVE Final   Comment:            The GeneXpert MRSA Assay (FDA     approved for NASAL specimens     only), is one component of a     comprehensive MRSA colonization     surveillance program. It is not     intended to diagnose MRSA     infection nor to guide or     monitor treatment for     MRSA infections.  BODY FLUID CULTURE     Status: None   Collection Time    12/29/13  3:33 PM      Result Value Ref Range Status   Specimen Description FLUID SYNOVIAL RIGHT SHOULDER   Final   Special Requests NONE   Final   Gram Stain     Final   Value: RARE WBC PRESENT, PREDOMINANTLY MONONUCLEAR     NO ORGANISMS SEEN     Performed at [facility]   Culture PENDING   Incomplete   Report Status PENDING   Incomplete  ANAEROBIC CULTURE     Status: None   Collection Time    12/29/13  3:33 PM      Result Value Ref Range Status   Specimen Description FLUID SYNOVIAL RIGHT SHOULDER   Final   Special Requests NONE   Final   Gram Stain     Final   Value: RARE WBC PRESENT, PREDOMINANTLY MONONUCLEAR     NO SQUAMOUS EPITHELIAL CELLS SEEN     NO ORGANISMS SEEN  Performed at [facility]    Culture PENDING   Incomplete   Report Status PENDING   Incomplete     Studies: Dg Chest 1 View  12/28/2013   CLINICAL DATA:  Right shoulder and chest pain. Pain with deep inspiration.  EXAM: CHEST - 1 VIEW  COMPARISON:  None.  FINDINGS: The lungs are well-aerated. Mild bibasilar opacities may reflect atelectasis or possibly mild pneumonia. There is no evidence of pleural effusion or pneumothorax.  The cardiomediastinal silhouette is within normal limits. No acute osseous abnormalities are seen. The right glenohumeral joint is grossly unremarkable in appearance.  IMPRESSION: Mild bibasilar opacities may reflect atelectasis or possibly mild pneumonia.   Electronically Signed   By: Jeffery  Chang M.D.   On: 12/28/2013 01:21   Dg Shoulder Right  12/28/2013   CLINICAL DATA:  Right shoulder pain, radiating to neck and chest. Difficulty moving right shoulder.  EXAM: RIGHT SHOULDER - 2+ VIEW  COMPARISON:  None.  FINDINGS: There is no evidence of fracture or dislocation. The right humeral head is seated within the glenoid fossa. The acromioclavicular joint is unremarkable in appearance. No significant soft tissue abnormalities are seen. The visualized portions of the right lung are clear.  IMPRESSION: No evidence of fracture or dislocation.   Electronically Signed   By: Jeffery  Chang M.D.   On: 12/28/2013 01:20   Us Abdomen Complete  12/28/2013   CLINICAL DATA:  Elevated LFTs.  EXAM: ULTRASOUND ABDOMEN COMPLETE  COMPARISON:  None.  FINDINGS: Gallbladder:  Relatively contracted. A 1.4 cm focus of increased echogenicity is noted at the gallbladder fundus, which may reflect an adenomatous polyp, tumefactive sludge or less likely a stone. No posterior acoustic shadowing is seen. No ultrasonographic Murphy's sign is elicited. No pericholecystic fluid is seen.  Common bile duct:  Diameter: 0.7 cm, borderline prominent, without evidence of distal obstruction.  Liver:  A small 1.3 cm focus of increased echogenicity  within the left hepatic lobe may reflect a hemangioma. No additional mass is seen. Within normal limits in parenchymal echogenicity.  IVC:  No abnormality visualized.  Pancreas:  Visualized portion unremarkable.  Spleen:  Enlarged, measuring 16.7 cm in length, with a volume of 1240 mL.  Right Kidney:  Length: 14.4 cm. Echogenicity within normal limits. No mass or hydronephrosis visualized.  Left Kidney:  Length: 14.1 cm. Echogenicity within normal limits. No mass or hydronephrosis visualized.  Abdominal aorta:  No aneurysm visualized.  Other findings:  A 1.0 cm lymph node adjacent to the pancreatic head is borderline normal in size.  IMPRESSION: 1. 1.3 cm focus of increased echogenicity within the left hepatic lobe is thought to reflect an hemangioma. Liver otherwise unremarkable in appearance. Would consider follow-up LFTs to ensure stability. 2. 1.4 cm focus of increased echogenicity noted at the gallbladder fundus. This may reflect an adenomatous polyp or tumefactive sludge. No posterior acoustic shadowing is seen, suggesting against a stone. No evidence for obstruction or cholecystitis. Endoscopic ultrasound could be considered for further evaluation, as deemed clinically appropriate. 3. Splenomegaly noted.   Electronically Signed   By: Jeffery  Chang M.D.   On: 12/28/2013 04:57   Mr Shoulder Right W Wo Contrast  12/28/2013   CLINICAL DATA:  Painful shoulder, query infection.  EXAM: MRI OF THE RIGHT SHOULDER WITHOUT AND WITH CONTRAST  TECHNIQUE: Multiplanar, multisequence MR imaging was performed both before and after administration of intravenous contrast.  CONTRAST:  2674mmL MULTIHANCE GADOBENATE DIMEGLUMINE 529 MG/ML IV SOLN  COMPARISON:  12/28/2013  FINDINGS: Larger than usual field-of-view images were obtained. Accelerated protocol head abuse due to motion artifact -the patient was unable to tolerate spending a lot of time in the magnet.  ROTATOR CUFF: Intact  MUSCLES: Abnormal edema within the distal  trapezius, supraspinatus, anterior deltoid, and tracking along adjacent fat planes, below the clavicle, and also tracking between the infraspinatus and posterior deltoid. Abnormal associated enhancement noted.  BICEPS LONG HEAD: Intact  ACROMIOCLAVICULAR JOINT: Abnormal fluid in the [facility] joint with marginal enhancement. No definite osteomyelitis in the adjacent acromion and clavicle. No proximal humeral edema signal or abnormal enhancement.  GLENOHUMERAL JOINT:  No significant shoulder joint effusion.  LABRUM: Indeterminate due to large field of view, but no obvious tear seen.  BONES:  No compelling findings of osteomyelitis.  IMPRESSION: 1. Abnormal edema and enhancement in the distal trapezius, anterior deltoid, and supraspinatus muscle, and tracking along adjacent fat planes, below the clavicle, and also between the infraspinatus and posterior deltoid. Appearance suggests myositis and potentially fasciitis. No drainable abscess identified. No shoulder joint effusion. 2. Small amount of fluid [facility] the [facility], indeterminate for infection, but without abnormal marrow enhancement the adjacent acromion or clavicle to further suggest septic AC joint.   Electronically Signed   By: Herbie BaltimoreWalt  Liebkemann M.D.   On: 12/28/2013 19:33   Echo  - Left ventricle: The cavity size was normal. Systolic function was normal. The estimated ejection fraction was in the range of 55% to 60%. Wall motion was normal; there were no regional wall motion abnormalities. - Aortic valve: There was trivial regurgitation. - Mitral valve: There was trivial regurgitation. - Tricuspid valve: There are at least 2 large mobile shaggy density on the ventricular side of the tricuspid valve consistent with vegetation.  Scheduled Meds: . enoxaparin (LOVENOX) injection  40 mg Subcutaneous Q24H  . famotidine  20 mg Oral BID  . ferrous sulfate  325 mg Oral Q breakfast  . lidocaine  1 patch Transdermal Q24H  . sodium chloride  3 mL Intravenous  Q12H  . vancomycin  750 mg Intravenous Q8H   Continuous Infusions: . sodium chloride 100 mL/hr at 12/30/13 0751    Time spent: 25 minutes  Christiane HaSULLIVAN,Jaaziel Peatross L, MD  [facility] Pager [phone]. If 7PM-7AM, please contact night-coverage at [email or web address], password [email or web address] 12/30/2013, 9:44 AM  LOS: 2 days  And

## 2013-12-30 NOTE — Progress Notes (Signed)
Subjective: The patient was seen at 9:30 AM.  She still complains of right shoulder pain in the region of her a.c. Joint.  Objective: Vital signs in last 24 hours: Temp:  [98 F (36.7 C)-99.1 F (37.3 C)] 98.5 F (36.9 C) (07/21 1319) Pulse Rate:  [81-89] 89 (07/21 1319) Resp:  [16] 16 (07/21 1319) BP: (118-136)/(81-90) 128/81 mmHg (07/21 1319) SpO2:  [97 %-100 %] 100 % (07/21 1319)  Intake/Output from previous day: 07/20 0701 - 07/21 0700 In: 990 [P.O.:840; IV Piggyback:150] Out: -  Intake/Output this shift:     Recent Labs  12/28/13 0042 12/28/13 0546 12/29/13 1005  HGB 9.7* 9.0* 8.8*    Recent Labs  12/28/13 0546 12/29/13 1005  WBC 6.8 6.4  RBC 3.47*  3.47* 3.39*  HCT 27.7* 27.2*  PLT 103* 111*    Recent Labs  12/28/13 0546 12/29/13 1005  NA 142 140  K 3.5* 3.3*  CL 106 104  CO2 22 23  BUN 14 8  CREATININE 0.72 0.62  GLUCOSE 107* 116*  CALCIUM 7.7* 8.0*    Recent Labs  12/28/13 0546  INR 1.25  cultures from right shoulder a.c. Joint showed no growth x1 day. Right shoulder exam: Still has tenderness over the right shoulder a.c. Joint.  No redness or warmth.  Minimal swelling.    Assessment/Plan: 1.  Right shoulder septic arthritis.  Had minimal fluid in right a.c. Joint.  Most of the aspiration appeared to be bloody, not purulent.  Only a couple of cc yesterday. Plan: Continue IV antibiotics per hospitalist/Dr. Orvan Falconerampbell of infectious disease department. Do not feel that any type of surgical intervention would benefit her related to the right shoulder. Will follow.   Kayveon Lennartz G 12/30/2013, 7:34 PM

## 2013-12-30 NOTE — Progress Notes (Signed)
CSW spoke with pt to discuss disposition. Pt has been made aware that she could potentially go to an SNF to continue IV antibiotics. CSW will continue to follow.   [id number] LaneDoris Kenshin Splawn, ConnecticutLCSWA [phone]

## 2013-12-30 NOTE — Consult Note (Signed)
[address]       Melanie Crane [address]             [phone]          CARDIOTHORACIC SURGERY CONSULTATION REPORT  PCP is No PCP Per Patient Referring Provider is [name], [name], MD   Reason for consultation:  Bacterial endocarditis  HPI:  Patient is a 38 year old single white female from [facility] with history of IV drug abuse and long-standing tobacco abuse who states that she was in her usual state of health until last week when she began to experience swelling and pain in the right index finger followed by the development of severe pain in her right shoulder. Symptoms persisted ultimately prompting her to present to the emergency department 12/28/2013.  She was noted to be febrile and hypotensive on admission, and routine blood work revealed microcytic anemia, thrombocytopenia, and abnormal liver function tests. Blood cultures were obtained and ultimately grew group B Streptococcus (Streptococcus agalactiae). An echocardiogram was performed demonstrating the presence of vegetations on the tricuspid valve with mild tricuspid regurgitation. The patient was seen in consultation by both the cardiology and infectious disease team's. Cardiothoracic surgical consultation has been requested.  The patient is single and lives with her mother indenting. She is out of work.  She admits to drug abuse using Opana which she does also and injects intravenously.  She's not very active physically. She denies any symptoms of shortness of breath either with activity or at rest. Her primary complaint is that of pain in her right shoulder which now radiates across her chest and upper back. She denies any pain elsewhere in her body although she states that she has had problems with back pain in the past. She denies any exertional chest pain. She has not had tachypalpitations or dizzy spells. She has not had abdominal swelling or bloating. She has not had lower extremity edema.  She denies any fevers chills or sweats. She states that her appetite is fair. She has not been losing weight.  Past Medical History  Diagnosis Date  . Anemia   . IVDU (intravenous drug user)   . 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 Bunionectomy    . Tonsillectomy      Family History  Problem Relation Age of Onset  . Hypertension Father   . Kidney failure Father   . Hyperlipidemia Mother   . CAD Mother     History   Social History  . Marital Status: Divorced    Spouse Name: N/A    Number of Children: N/A  . Years of Education: N/A   Occupational History  . Not on file.   Social History Main Topics  . Smoking status: Current Every Day Smoker  . Smokeless tobacco: Never Used     Comment: 1 1/2 ppd  . Alcohol Use: No  . Drug Use: Yes     Comment: injectable opana in L forarm for 2-3 month  . Sexual Activity: Not on file   Other Topics Concern  . Not on file   Social History Narrative  . No narrative on file    Prior to Admission medications   Not on File    Current Facility-Administered Medications  Medication Dose Route Frequency Provider Last Rate Last Dose  . 0.9 %  sodium chloride infusion   Intravenous  Continuous Lynden Oxford, MD 100 mL/hr at 12/30/13 0751    . diphenhydrAMINE (BENADRYL) capsule 25 mg  25 mg Oral Q8H PRN Lynden Oxford, MD   25 mg at 12/30/13 0035  . enoxaparin (LOVENOX) injection 40 mg  40 mg Subcutaneous Q24H Lynden Oxford, MD   40 mg at 12/29/13 1600  . famotidine (PEPCID) tablet 20 mg  20 mg Oral BID Lynden Oxford, MD   20 mg at 12/30/13 1000  . fentaNYL (SUBLIMAZE) injection 100 mcg  100 mcg Intravenous Q2H PRN Vida Roller, MD   100 mcg at 12/30/13 0755  . ferrous sulfate tablet 325 mg  325 mg Oral Q breakfast Christiane Ha, MD   325 mg at 12/30/13 0749  . lidocaine (LIDODERM) 5 % 1 patch  1 patch Transdermal Q24H Lynden Oxford, MD    1 patch at 12/30/13 1000  . ondansetron (ZOFRAN) tablet 4 mg  4 mg Oral Q6H PRN Lynden Oxford, MD       Or  . ondansetron (ZOFRAN) injection 4 mg  4 mg Intravenous Q6H PRN Lynden Oxford, MD      . oxyCODONE (Oxy IR/ROXICODONE) immediate release tablet 5-10 mg  5-10 mg Oral Q3H PRN Lynden Oxford, MD   10 mg at 12/30/13 1000  . sodium chloride 0.9 % injection 3 mL  3 mL Intravenous Q12H Lynden Oxford, MD      . vancomycin (VANCOCIN) IVPB 750 mg/150 ml premix  750 mg Intravenous Q8H Dannielle Karvonen Enderlin, RPH   750 mg at 12/30/13 0325    No Known Allergies    Review of Systems:   General:  fair appetite, normal energy, no weight gain, no weight loss, no fever  Cardiac:  no chest pain with exertion, no chest pain at rest, no SOB with exertion, no resting SOB, no PND, no orthopnea, no palpitations, no arrhythmia, no atrial fibrillation, no LE edema, no dizzy spells, no syncope  Respiratory:  no shortness of breath, no home oxygen, no productive cough, no dry cough, no bronchitis, no wheezing, no hemoptysis, no asthma, no pain with inspiration or cough, no sleep apnea, no CPAP at night  GI:   no difficulty swallowing, no reflux, no frequent heartburn, no hiatal hernia, no abdominal pain, no constipation, no diarrhea, no hematochezia, no hematemesis, no melena  GU:   no dysuria,  no frequency, no urinary tract infection, no hematuria, no kidney stones, no kidney disease  Vascular:  no pain suggestive of claudication, no pain in feet, no leg cramps, no varicose veins, no DVT, no non-healing foot ulcer  Neuro:   no stroke, no TIA's, no seizures, no headaches, no temporary blindness one eye,  no slurred speech, no peripheral neuropathy, + chronic pain, no instability of gait, no memory/cognitive dysfunction  Musculoskeletal: no arthritis, + right shoulder joint swelling, + myalgias, no difficulty walking, normal mobility   Skin:   no rash, no itching, no skin infections, no pressure sores or  ulcerations  Psych:   no anxiety, no depression, no nervousness, no unusual recent stress  Eyes:   no blurry vision, no floaters, no recent vision changes, no wears glasses or contacts  ENT:   no hearing loss, edentulous with no loose or painful teeth, full set dentures  Hematologic:  no easy bruising, no abnormal bleeding, no clotting disorder, no frequent epistaxis  Endocrine:  no diabetes, does not check CBG's at home     Physical Exam:   BP 136/90  Pulse 81  Temp(Src)  98 F (36.7 C) (Oral)  Resp 16  Ht 5\' 4"  (1.626 m)  Wt 70.2 kg (154 lb 12.2 oz)  BMI 26.55 kg/m2  SpO2 98%  General:    well-appearing  HEENT:  Unremarkable   Neck:   no JVD, no bruits, no adenopathy   Chest:   clear to auscultation, symmetrical breath sounds, no wheezes, no rhonchi   CV:   RRR, no  murmur   Abdomen:  soft, non-tender, no masses   Extremities:  warm, well-perfused, pulses palpable, no lower extremity edema  Rectal/GU  Deferred  Neuro:   Grossly non-focal and symmetrical throughout  Skin:   Clean and dry, no rashes, no breakdown  Diagnostic Tests:  Transthoracic Echocardiography  Patient: Melanie Crane, Edling MR #: [id number] Study Date: 12/29/2013 Gender: F Age: 55 Height: 162.6 cm Weight: 70.2 kg BSA: 1.8 m^2 Pt. Status: Room: 5N31C  Raliegh Scarlet, Perrin Smack ATTENDING Eber Hong D SONOGRAPHER [facility], [facility] ADMITTING Lynden Oxford [id number] PERFORMING [facility], Inpatient  cc:  ------------------------------------------------------------------- LV EF: 55% - 60%  ------------------------------------------------------------------- Indications: Fever 780.6.  ------------------------------------------------------------------- History: PMH: IV drug use.  ------------------------------------------------------------------- Study Conclusions  - Left ventricle: The cavity size was normal. Systolic function was normal. The estimated  ejection fraction was in the range of 55% to 60%. Wall motion was normal; there were no regional wall motion abnormalities. - Aortic valve: There was trivial regurgitation. - Mitral valve: There was trivial regurgitation. - Tricuspid valve: There are at least 2 large mobile shaggy density on the ventricular side of the tricuspid valve consistent with vegetation.  Transthoracic echocardiography. M-mode, complete 2D, spectral Doppler, and color Doppler. Birthdate: Patient birthdate: 1976/01/27. Age: Patient is 38 yr old. Sex: Gender: female. Height: Height: 162.6 cm. Height: 64 in. Weight: Weight: 70.2 kg. Weight: 154.4 lb. Body mass index: BMI: 26.6 kg/m^2. Body surface area: BSA: 1.8 m^2. Blood pressure: 121/84 Patient status: Inpatient. Study date: Study date: 12/29/2013. Study time: 10:36 AM. Location: Echo laboratory.  -------------------------------------------------------------------  ------------------------------------------------------------------- Left ventricle: The cavity size was normal. Systolic function was normal. The estimated ejection fraction was in the range of 55% to 60%. Wall motion was normal; there were no regional wall motion abnormalities.  ------------------------------------------------------------------- Aortic valve: Trileaflet; normal thickness, mildly calcified leaflets. Mobility was not restricted. Doppler: Transvalvular velocity was within the normal range. There was no stenosis. There was trivial regurgitation.  ------------------------------------------------------------------- Aorta: Aortic root: The aortic root was normal in size.  ------------------------------------------------------------------- Mitral valve: Structurally normal valve. Mobility was not restricted. Doppler: Transvalvular velocity was within the normal range. There was no evidence for stenosis. There was trivial regurgitation. Peak gradient (D): 3 mm  Hg.  ------------------------------------------------------------------- Left atrium: The atrium was normal in size.  ------------------------------------------------------------------- Right ventricle: The cavity size was normal. Wall thickness was normal. Systolic function was normal.  ------------------------------------------------------------------- Pulmonic valve: Structurally normal valve. Cusp separation was normal. Doppler: Transvalvular velocity was within the normal range. There was no evidence for stenosis. There was no regurgitation.  ------------------------------------------------------------------- Tricuspid valve: There are at least 2 large mobile shaggy density on the ventricular side of the tricuspid valve consistent with vegetation. Structurally normal valve. Doppler: Transvalvular velocity was within the normal range. There was mild regurgitation.  ------------------------------------------------------------------- Pulmonary artery: The main pulmonary artery was normal-sized. Systolic pressure was within the normal range.  ------------------------------------------------------------------- Right atrium: The atrium was normal in size.  ------------------------------------------------------------------- Pericardium: There was no pericardial effusion.  ------------------------------------------------------------------- Systemic veins: Inferior vena cava: The vessel was dilated.  The respirophasic diameter changes were blunted (< 50%), consistent with elevated central venous pressure.  ------------------------------------------------------------------- Prepared and Electronically Authenticated by  Armanda Magicraci Turner, MD 2015-07-20T12:01:56  ------------------------------------------------------------------- Measurements  Left ventricle Value Reference LV ID, ED, PLAX chordal (N) 48.8 mm 43 - 52 LV ID, ES, PLAX chordal (N) 37 mm 23 - 38 LV fx shortening, PLAX  chordal (L) 24 % >=29 LV PW thickness, ED 10.9 mm --------- IVS/LV PW ratio, ED (N) 0.85 <=1.3 LV e&', lateral 9.79 cm/s --------- LV E/e&', lateral 8.47 --------- LV e&', medial 11.2 cm/s --------- LV E/e&', medial 7.4 --------- LV e&', average 10.5 cm/s --------- LV E/e&', average 7.9 ---------  Ventricular septum Value Reference IVS thickness, ED 9.26 mm ---------  Aorta Value Reference Aortic root ID, ED 30 mm ---------  Left atrium Value Reference LA ID, A-P, ES 28 mm --------- LA ID/bsa, A-P (N) 1.56 cm/m^2 <=2.2  Mitral valve Value Reference Mitral E-wave peak velocity 82.9 cm/s --------- Mitral A-wave peak velocity 41.7 cm/s --------- Mitral deceleration time (H) 275 ms 150 - 230 Mitral peak gradient, D 3 mm Hg --------- Mitral E/A ratio, peak 2 ---------  Systemic veins Value Reference Estimated CVP 3 mm Hg ---------  Right ventricle Value Reference RV s&', lateral, S 10.9 cm/s ---------  Legend: (L) and (H) mark values outside specified reference range.  (N) marks values inside specified reference range.    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 Blood cultures are growing group B streptococcus.  There are no complicating features. Specifically, there is only mild tricuspid regurgitation and no signs of right-sided heart failure. CT angiogram of the chest has not been performed to rule out the possibility of septic pulmonary emboli, although the patient does not have any signs or symptoms to suggest this might be the case.  Similarly, transesophageal echocardiogram has not been performed to definitively rule out the possibility of left-sided endocarditis or patent foramen ovale. However, there are no indications for surgical intervention at this time.    Plan:  I would consider CT angiogram of the chest to rule out septic pulmonary embolus. Similarly, it would not  be unreasonable to consider transesophageal echocardiogram to make certain there is no patent foramen ovale or signs of left-sided endocarditis.  However, it would not be unreasonable to simply treat the patient for right-sided bacterial endocarditis and continue to follow the patient clinically. Without successful treatment of her underlying IV drug abuse her long-term prognosis would be guarded at best. Please call if we can be of further assistance in the future.   I spent in excess of 60 minutes during the conduct of this hospital consultation and >50% of this time involved direct face-to-face encounter for counseling and/or coordination of the patient's care.   Salvatore Decentlarence H. Cornelius Moraswen, MD 12/30/2013 11:39 AM

## 2013-12-31 LAB — CULTURE, BLOOD (ROUTINE X 2)

## 2013-12-31 LAB — URINALYSIS, ROUTINE W REFLEX MICROSCOPIC
BILIRUBIN URINE: NEGATIVE
Glucose, UA: NEGATIVE mg/dL
Hgb urine dipstick: NEGATIVE
KETONES UR: NEGATIVE mg/dL
LEUKOCYTES UA: NEGATIVE
NITRITE: NEGATIVE
PROTEIN: NEGATIVE mg/dL
Specific Gravity, Urine: 1.012 (ref 1.005–1.030)
UROBILINOGEN UA: 0.2 mg/dL (ref 0.0–1.0)
pH: 6.5 (ref 5.0–8.0)

## 2013-12-31 LAB — BASIC METABOLIC PANEL
Anion gap: 12 (ref 5–15)
BUN: 6 mg/dL (ref 6–23)
CHLORIDE: 104 meq/L (ref 96–112)
CO2: 26 meq/L (ref 19–32)
CREATININE: 0.67 mg/dL (ref 0.50–1.10)
Calcium: 8.4 mg/dL (ref 8.4–10.5)
GFR calc Af Amer: >90 mL/min (ref >90)
GFR calc non Af Amer: >90 mL/min (ref >90)
Glucose, Bld: 97 mg/dL (ref 70–99)
Potassium: 3.6 mEq/L — ABNORMAL LOW (ref 3.7–5.3)
Sodium: 142 mEq/L (ref 137–147)

## 2013-12-31 LAB — CBC
HEMATOCRIT: 27.3 % — AB (ref 36.0–46.0)
HEMOGLOBIN: 8.8 g/dL — AB (ref 12.0–15.0)
MCH: 26 pg (ref 26.0–34.0)
MCHC: 32.2 g/dL (ref 30.0–36.0)
MCV: 80.8 fL (ref 78.0–100.0)
Platelets: 173 10*3/uL (ref 150–400)
RBC: 3.38 MIL/uL — AB (ref 3.87–5.11)
RDW: 16.3 % — ABNORMAL HIGH (ref 11.5–15.5)
WBC: 6.6 10*3/uL (ref 4.0–10.5)

## 2013-12-31 LAB — MAGNESIUM: Magnesium: 1.6 mg/dL (ref 1.5–2.5)

## 2013-12-31 MED ORDER — POTASSIUM CHLORIDE IN NACL 40-0.9 MEQ/L-% IV SOLN
INTRAVENOUS | Status: DC
  Administered 2014-01-01 – 2014-01-02 (×2): 75 mL/h via INTRAVENOUS
  Filled 2013-12-31 (×4): qty 1000

## 2013-12-31 NOTE — Progress Notes (Signed)
Subjective: Patient reports improvement of right shoulder pain.  Objective: Vital signs in last 24 hours: Temp:  [97.8 F (36.6 C)-99.8 F (37.7 C)] 97.8 F (36.6 C) (07/22 1300) Pulse Rate:  [73-83] 73 (07/22 1300) Resp:  [16] 16 (07/22 1300) BP: (125-141)/(86-88) 125/87 mmHg (07/22 1300) SpO2:  [98 %-100 %] 98 % (07/22 1300)  Intake/Output from previous day: 07/21 0701 - 07/22 0700 In: 2880 [P.O.:510; I.V.:2170; IV Piggyback:200] Out: -  Intake/Output this shift: Total I/O In: 480 [P.O.:480] Out: -    Recent Labs  12/29/13 1005 12/31/13 0540  HGB 8.8* 8.8*    Recent Labs  12/29/13 1005 12/31/13 0540  WBC 6.4 6.6  RBC 3.39* 3.38*  HCT 27.2* 27.3*  PLT 111* 173    Recent Labs  12/29/13 1005 12/31/13 0540  NA 140 142  K 3.3* 3.6*  CL 104 104  CO2 23 26  BUN 8 6  CREATININE 0.62 0.67  GLUCOSE 116* 97  CALCIUM 8.0* 8.4   No results found for this basename: LABPT, INR,  in the last 72 hours  Right shoulder exam: Less pain with ROM. Still has AC joint tenderness. AC joint aspiration cultures neg so far Assessment/Plan: Right shoulder septic arthritis Plan: Continue tx per ID/hospitalists Await final cx results.  Katrine Radich G 12/31/2013, 3:55 PM

## 2013-12-31 NOTE — Progress Notes (Signed)
Patient ID: Melanie Crane, female   DOB: Apr 13, 1976, 38 y.o.   MRN: [id number]         [facility] for Infectious Disease    Date of Admission:  12/28/2013           Day 4 antibiotics  Principal Problem:   Acute bacterial endocarditis - right sided Active Problems:   Bacterial myositis   IV drug abuse   Secondary amenorrhea   Anemia   Thrombocytopenia, unspecified   Cigarette smoker   Hepatitis C antibody test positive   Hypokalemia   UTI (urinary tract infection)   . cefTRIAXone (ROCEPHIN)  IV  2 g Intravenous Q24H  . enoxaparin (LOVENOX) injection  40 mg Subcutaneous Q24H  . famotidine  20 mg Oral BID  . ferrous sulfate  325 mg Oral Q breakfast  . lidocaine  1 patch Transdermal Q24H  . sodium chloride  3 mL Intravenous Q12H    Subjective: She states that her pain in her right shoulder has improved.  Objective: Temp:  [97.8 F (36.6 C)-99.8 F (37.7 C)] 97.8 F (36.6 C) (07/22 1300) Pulse Rate:  [73-83] 73 (07/22 1300) Resp:  [16] 16 (07/22 1300) BP: (125-141)/(86-88) 125/87 mmHg (07/22 1300) SpO2:  [98 %-100 %] 98 % (07/22 1300)  General: She is in no distress.  Skin: No rash Lungs: Clear Cor: Regular S1 and S2 with no murmurs Abdomen: Soft and nontender Joints and extremities: Right shoulder is bandaged.   Lab Results Lab Results  Component Value Date   WBC 6.6 12/31/2013   HGB 8.8* 12/31/2013   HCT 27.3* 12/31/2013   MCV 80.8 12/31/2013   PLT 173 12/31/2013    Lab Results  Component Value Date   CREATININE 0.67 12/31/2013   BUN 6 12/31/2013   NA 142 12/31/2013   K 3.6* 12/31/2013   CL 104 12/31/2013   CO2 26 12/31/2013    Lab Results  Component Value Date   ALT 75* 12/29/2013   AST 16 12/29/2013   ALKPHOS 107 12/29/2013   BILITOT 0.3 12/29/2013      Microbiology: Recent Results (from the past 240 hour(s))  CULTURE, BLOOD (ROUTINE X 2)     Status: None   Collection Time    12/28/13 12:38 AM      Result Value Ref Range Status   Specimen  Description BLOOD LEFT FOREARM   Final   Special Requests BOTTLES DRAWN AEROBIC AND ANAEROBIC [facility] EACH   Final   Culture  Setup Time     Final   Value: 12/28/2013 18:07     Performed at [facility]   Culture     Final   Value: GROUP B STREP(S.AGALACTIAE)ISOLATED     Note: Gram Stain Report Called to,Read Back By and Verified With:  Kandra Nicolas RN @ 1217AM VINCJ     Performed at [facility]   Report Status 12/31/2013 FINAL   Final   Organism ID, Bacteria GROUP B STREP(S.AGALACTIAE)ISOLATED   Final  CULTURE, BLOOD (ROUTINE X 2)     Status: None   Collection Time    12/28/13 12:45 AM      Result Value Ref Range Status   Specimen Description BLOOD RIGHT ARM   Final   Special Requests BOTTLES DRAWN AEROBIC AND ANAEROBIC 10CC EACH   Final   Culture  Setup Time     Final   Value: 12/28/2013 18:07     Performed at [facility]  Final   Value: GROUP B STREP(S.AGALACTIAE)ISOLATED     Note: CRITICAL RESULT CALLED TO, READ BACK BY AND VERIFIED WITH: JULIE P. RN @ 1217AM VINCJ 12/29/13 SUSCEPTIBILITIES PERFORMED ON PREVIOUS CULTURE WITHIN THE LAST 5 DAYS.     [facility]   Report Status 12/31/2013 FINAL   Final  MRSA PCR SCREENING     Status: None   Collection Time    12/28/13  4:44 AM      Result Value Ref Range Status   MRSA by PCR NEGATIVE  NEGATIVE Final   Comment:            The GeneXpert MRSA Assay (FDA     approved for NASAL specimens     only), is one component of a     comprehensive MRSA colonization     surveillance program. It is not     intended to diagnose MRSA     infection nor to guide or     monitor treatment for     MRSA infections.  BODY FLUID CULTURE     Status: None   Collection Time    12/29/13  3:33 PM      Result Value Ref Range Status   Specimen Description FLUID SYNOVIAL RIGHT SHOULDER   Final   Special Requests NONE   Final   Gram Stain     Final   Value: RARE WBC PRESENT, PREDOMINANTLY MONONUCLEAR       NO ORGANISMS SEEN     [facility]formed at [facility]   Culture     Final   Value: NO GROWTH 2 DAYS     [facility]formed at [facility]   Report Status PENDING   Incomplete  ANAEROBIC CULTURE     Status: None   Collection Time    12/29/13  3:33 PM      Result Value Ref Range Status   Specimen Description FLUID SYNOVIAL RIGHT SHOULDER   Final   Special Requests NONE   Final   Gram Stain     Final   Value: RARE WBC PRESENT, PREDOMINANTLY MONONUCLEAR     NO SQUAMOUS EPITHELIAL CELLS SEEN     NO ORGANISMS SEEN     [facility]formed at [facility]   Culture     Final   Value: NO ANAEROBES ISOLATED; CULTURE IN PROGRESS FOR 5 DAYS     [facility]formed at [facility]   Report Status PENDING   Incomplete  CULTURE, BLOOD (ROUTINE X 2)     Status: None   Collection Time    12/30/13  7:42 AM      Result Value Ref Range Status   Specimen Description BLOOD RIGHT ANTECUBITAL   Final   Special Requests BOTTLES DRAWN AEROBIC AND ANAEROBIC 10CC   Final   Culture  Setup Time     Final   Value: 12/30/2013 11:56     Performed at [facility]   Culture     Final   Value:        BLOOD CULTURE RECEIVED NO GROWTH TO DATE CULTURE WILL BE HELD FOR 5 DAYS BEFORE ISSUING A FINAL NEGATIVE REPORT     [facility]formed at [facility]   Report Status PENDING   Incomplete  CULTURE, BLOOD (ROUTINE X 2)     Status: None   Collection Time    12/30/13  7:50 AM      Result Value Ref Range Status   Specimen Description BLOOD RIGHT  WRIST   Final   Special Requests BOTTLES DRAWN AEROBIC AND ANAEROBIC 10CC   Final   Culture  Setup Time     Final   Value: 12/30/2013 11:56     Performed at [facility]   Culture     Final   Value:        BLOOD CULTURE RECEIVED NO GROWTH TO DATE CULTURE WILL BE HELD FOR 5 DAYS BEFORE ISSUING A FINAL NEGATIVE REPORT     Performed at [facility]   Report Status PENDING   Incomplete    Studies/Results: No results found.  Assessment: She has  group B streptococcal tricuspid valve endocarditis and probable seeding of her right shoulder from her bacteremia. She has no clinical or radiographic evidence of septic pulmonary emboli although she is certainly at risk for this. I favor continued medical therapy for her infection. I do not feel strongly that she needs a long CT scan or TEE. I will change her vancomycin to ceftriaxone while she is here then give her a single dose of oritavancin, a new antibiotic with a very long half-life giving therapeutic levels for approximately 3 weeks against strep and staph. She is not a good candidate for a PICC and outpatient IV antibiotics because of her injecting drug use.  Plan: 1. Continue ceftriaxone for now; consider single dose oritavancin soon 2. Await final results of repeat blood cultures and right shoulder cultures  Cliffton Asters, MD [facility] [phone] pager   [phone] cell 12/31/2013, 3:23 PM

## 2013-12-31 NOTE — Progress Notes (Signed)
[facility] PROGRESS NOTE  Melanie Crane ZOX:[id number] DOB: 12-30-1975 DOA: 12/28/2013 PCP: No PCP Per Patient  Assessment/Plan: Principal Problem:   Acute bacterial endocarditis - right sided Active Problems:   Secondary amenorrhea   Bacterial myositis   IV drug abuse   Anemia   Thrombocytopenia, unspecified   Cigarette smoker   Hepatitis C antibody test positive   Hypokalemia   UTI (urinary tract infection)     Acute bacterial endocarditis: echo shows tricuspid vegetations. Admits to injecting opana for 2 months. Blood cultures grew GBS. abx per ID. Cardiology has consulted CT surgery. No signs of right-sided heart failure, no tricuspid regurgitation, no indications for surgical intervention at this time Cardiothoracic surgery Recommended CT angiogram to rule out septic emboli CT surgery also recommended transesophageal echo to rule out patent foramen ovale or signs of left-sided endocarditis Cardiology recommends TEE only if suspected right-sided failure or evidence of left-sided embolic events Infectious disease recommend continued medical therapy, did not feel strongly about CT scan or TEE Change vancomycin to Rocephin, consider giving single dose of oritavancin Await results of repeat blood cultures and right shoulder cultures  Septic shock resolved   Secondary amenorrhea   Bacterial myositis: pain slowly improving   Small amount of fluid in the [facility] joint: aspirated by ortho. Mainly clot, but culture pending.  Orthopedics following,Do not feel that any type of surgical intervention would benefit her related to the right shoulder   IV drug abuse: Patient reports that she has been injecting Opana for 2 months. She has a long history of abusing oral opioids and was in prison for 5 years. Patient voices desire to quit using, but this will likely complicate her disposition. social work consult pending for drug treatment options. Patient does have an acute  indication for pain medication, but will need to be weaned off eventually. HIV negative    Hep C ab positive. Not a candidate for treatment at this time so no need for further workup  Anemia: Likely iron deficiency and infection. oral iron started  Thrombocytopenia, unspecified  Hypokalemia: recheck in am  UTI: no cx done. Will repeat UA and order cx   Code Status: full  Family Communication:  Disposition Plan: ? As per infectious disease recommendations  Consultants:  Orthopedics, Dr. Luiz Blare  Infectious disease  Cardiology, [facility]  [facility] Procedures:  Aspiration right AC joint Antibiotics:  Per ID HPI/Subjective:  Shoulder pain slightly improved. Wondering when she can go home      HPI/Subjective: Patient reports improvement of right shoulder pain   Objective: Filed Vitals:   12/30/13 1319 12/30/13 2056 12/31/13 0514 12/31/13 1300  BP: 128/81 131/86 141/88 125/87  Pulse: 89 83 80 73  Temp: 98.5 F (36.9 C) 98.8 F (37.1 C) 99.8 F (37.7 C) 97.8 F (36.6 C)  TempSrc: Oral Oral Oral   Resp: 16 16 16 16   Height:      Weight:      SpO2: 100% 100% 99% 98%    Intake/Output Summary (Last 24 hours) at 12/31/13 1735 Last data filed at 12/31/13 1300  Gross per 24 hour  Intake   1680 ml  Output      0 ml  Net   1680 ml    Exam:  General: She is in no distress. She is tearful during the exam talking about her injecting drug use  Skin: No rash  Lungs: Clear  Cor: Regular S1 and S2 with no murmurs  Abdomen: Soft and nontender  Joints  and extremities: Right shoulder is bandaged. Minimal diffuse swelling of right index finger without cellulitis    Data Reviewed: Basic Metabolic Panel:  Recent Labs Lab 12/28/13 0042 12/28/13 0546 12/29/13 1005 12/31/13 0540  NA 133* 142 140 142  K 3.8 3.5* 3.3* 3.6*  CL 93* 106 104 104  CO2 22 22 23 26   GLUCOSE 112* 107* 116* 97  BUN 16 14 8 6   CREATININE 0.76 0.72 0.62 0.67  CALCIUM 8.1* 7.7* 8.0* 8.4  MG  --   --    --  1.6    Liver Function Tests:  Recent Labs Lab 12/28/13 0042 12/28/13 0546 12/29/13 1005  AST 41* 34 16  ALT 148* 113* 75*  ALKPHOS 146* 119* 107  BILITOT 0.6 0.6 0.3  PROT 6.6 5.6* 5.9*  ALBUMIN 2.5* 2.0* 2.1*   No results found for this basename: LIPASE, AMYLASE,  in the last 168 hours No results found for this basename: AMMONIA,  in the last 168 hours  CBC:  Recent Labs Lab 12/28/13 0042 12/28/13 0546 12/29/13 1005 12/31/13 0540  WBC 9.5 6.8 6.4 6.6  NEUTROABS 6.1 3.8 4.1  --   HGB 9.7* 9.0* 8.8* 8.8*  HCT 30.4* 27.7* 27.2* 27.3*  MCV 78.8 79.8 80.2 80.8  PLT 125* 103* 111* 173    Cardiac Enzymes:  Recent Labs Lab 12/29/13 1005  CKTOTAL 48   BNP (last 3 results) No results found for this basename: PROBNP,  in the last 8760 hours   CBG: No results found for this basename: GLUCAP,  in the last 168 hours  Recent Results (from the past 240 hour(s))  CULTURE, BLOOD (ROUTINE X 2)     Status: None   Collection Time    12/28/13 12:38 AM      Result Value Ref Range Status   Specimen Description BLOOD LEFT FOREARM   Final   Special Requests BOTTLES DRAWN AEROBIC AND ANAEROBIC [facility] EACH   Final   Culture  Setup Time     Final   Value: 12/28/2013 18:07     Performed at [facility]   Culture     Final   Value: GROUP B STREP(S.AGALACTIAE)ISOLATED     Note: Gram Stain Report Called to,Read Back By and Verified With:  Kandra Nicolas RN @ 1217AM VINCJ     Performed at [facility]   Report Status 12/31/2013 FINAL   Final   Organism ID, Bacteria GROUP B STREP(S.AGALACTIAE)ISOLATED   Final  CULTURE, BLOOD (ROUTINE X 2)     Status: None   Collection Time    12/28/13 12:45 AM      Result Value Ref Range Status   Specimen Description BLOOD RIGHT ARM   Final   Special Requests BOTTLES DRAWN AEROBIC AND ANAEROBIC 10CC EACH   Final   Culture  Setup Time     Final   Value: 12/28/2013 18:07     Performed at [facility]   Culture     Final    Value: GROUP B STREP(S.AGALACTIAE)ISOLATED     Note: CRITICAL RESULT CALLED TO, READ BACK BY AND VERIFIED WITH: JULIE P. RN @ 1217AM VINCJ 12/29/13 SUSCEPTIBILITIES PERFORMED ON PREVIOUS CULTURE WITHIN THE LAST 5 DAYS.     Performed at [facility]   Report Status 12/31/2013 FINAL   Final  MRSA PCR SCREENING     Status: None   Collection Time    12/28/13  4:44 AM      Result Value  Ref Range Status   MRSA by PCR NEGATIVE  NEGATIVE Final   Comment:            The GeneXpert MRSA Assay (FDA     approved for NASAL specimens     only), is one component of a     comprehensive MRSA colonization     surveillance program. It is not     intended to diagnose MRSA     infection nor to guide or     monitor treatment for     MRSA infections.  BODY FLUID CULTURE     Status: None   Collection Time    12/29/13  3:33 PM      Result Value Ref Range Status   Specimen Description FLUID SYNOVIAL RIGHT SHOULDER   Final   Special Requests NONE   Final   Gram Stain     Final   Value: RARE WBC PRESENT, PREDOMINANTLY MONONUCLEAR     NO ORGANISMS SEEN     Performed at [facility]   Culture     Final   Value: NO GROWTH 2 DAYS     Performed at [facility]   Report Status PENDING   Incomplete  ANAEROBIC CULTURE     Status: None   Collection Time    12/29/13  3:33 PM      Result Value Ref Range Status   Specimen Description FLUID SYNOVIAL RIGHT SHOULDER   Final   Special Requests NONE   Final   Gram Stain     Final   Value: RARE WBC PRESENT, PREDOMINANTLY MONONUCLEAR     NO SQUAMOUS EPITHELIAL CELLS SEEN     NO ORGANISMS SEEN     Performed at [facility]   Culture     Final   Value: NO ANAEROBES ISOLATED; CULTURE IN PROGRESS FOR 5 DAYS     Performed at [facility]   Report Status PENDING   Incomplete  CULTURE, BLOOD (ROUTINE X 2)     Status: None   Collection Time    12/30/13  7:42 AM      Result Value Ref Range Status   Specimen Description BLOOD RIGHT  ANTECUBITAL   Final   Special Requests BOTTLES DRAWN AEROBIC AND ANAEROBIC 10CC   Final   Culture  Setup Time     Final   Value: 12/30/2013 11:56     Performed at [facility]   Culture     Final   Value:        BLOOD CULTURE RECEIVED NO GROWTH TO DATE CULTURE WILL BE HELD FOR 5 DAYS BEFORE ISSUING A FINAL NEGATIVE REPORT     Performed at [facility]   Report Status PENDING   Incomplete  CULTURE, BLOOD (ROUTINE X 2)     Status: None   Collection Time    12/30/13  7:50 AM      Result Value Ref Range Status   Specimen Description BLOOD RIGHT WRIST   Final   Special Requests BOTTLES DRAWN AEROBIC AND ANAEROBIC 10CC   Final   Culture  Setup Time     Final   Value: 12/30/2013 11:56     Performed at [facility]   Culture     Final   Value:        BLOOD CULTURE RECEIVED NO GROWTH TO DATE CULTURE WILL BE HELD FOR 5 DAYS BEFORE ISSUING A FINAL NEGATIVE REPORT     Performed at  [facility]   Report Status PENDING   Incomplete     Studies: Dg Chest 1 View  12/28/2013   CLINICAL DATA:  Right shoulder and chest pain. Pain with deep inspiration.  EXAM: CHEST - 1 VIEW  COMPARISON:  None.  FINDINGS: The lungs are well-aerated. Mild bibasilar opacities may reflect atelectasis or possibly mild pneumonia. There is no evidence of pleural effusion or pneumothorax.  The cardiomediastinal silhouette is within normal limits. No acute osseous abnormalities are seen. The right glenohumeral joint is grossly unremarkable in appearance.  IMPRESSION: Mild bibasilar opacities may reflect atelectasis or possibly mild pneumonia.   Electronically Signed   By: Roanna RaiderJeffery  Chang M.D.   On: 12/28/2013 01:21   Dg Shoulder Right  12/28/2013   CLINICAL DATA:  Right shoulder pain, radiating to neck and chest. Difficulty moving right shoulder.  EXAM: RIGHT SHOULDER - 2+ VIEW  COMPARISON:  None.  FINDINGS: There is no evidence of fracture or dislocation. The right humeral head is seated within the  glenoid fossa. The acromioclavicular joint is unremarkable in appearance. No significant soft tissue abnormalities are seen. The visualized portions of the right lung are clear.  IMPRESSION: No evidence of fracture or dislocation.   Electronically Signed   By: Roanna RaiderJeffery  Chang M.D.   On: 12/28/2013 01:20   [place] Abdomen Complete  12/28/2013   CLINICAL DATA:  Elevated LFTs.  EXAM: ULTRASOUND ABDOMEN COMPLETE  COMPARISON:  None.  FINDINGS: Gallbladder:  Relatively contracted. A 1.4 cm focus of increased echogenicity is noted at the gallbladder fundus, which may reflect an adenomatous polyp, tumefactive sludge or less likely a stone. No posterior acoustic shadowing is seen. No ultrasonographic Murphy's sign is elicited. No pericholecystic fluid is seen.  Common bile duct:  Diameter: 0.7 cm, borderline prominent, without evidence of distal obstruction.  Liver:  A small 1.3 cm focus of increased echogenicity within the left hepatic lobe may reflect a hemangioma. No additional mass is seen. Within normal limits in parenchymal echogenicity.  IVC:  No abnormality visualized.  Pancreas:  Visualized portion unremarkable.  Spleen:  Enlarged, measuring 16.7 cm in length, with a volume of 1240 mL.  Right Kidney:  Length: 14.4 cm. Echogenicity within normal limits. No mass or hydronephrosis visualized.  Left Kidney:  Length: 14.1 cm. Echogenicity within normal limits. No mass or hydronephrosis visualized.  Abdominal aorta:  No aneurysm visualized.  Other findings:  A 1.0 cm lymph node adjacent to the pancreatic head is borderline normal in size.  IMPRESSION: 1. 1.3 cm focus of increased echogenicity within the left hepatic lobe is thought to reflect an hemangioma. Liver otherwise unremarkable in appearance. Would consider follow-up LFTs to ensure stability. 2. 1.4 cm focus of increased echogenicity noted at the gallbladder fundus. This may reflect an adenomatous polyp or tumefactive sludge. No posterior acoustic shadowing is seen,  suggesting against a stone. No evidence for obstruction or cholecystitis. Endoscopic ultrasound could be considered for further evaluation, as deemed clinically appropriate. 3. Splenomegaly noted.   Electronically Signed   By: Roanna RaiderJeffery  Chang M.D.   On: 12/28/2013 04:57   Mr Shoulder Right W Wo Contrast  12/28/2013   CLINICAL DATA:  Painful shoulder, query infection.  EXAM: MRI OF THE RIGHT SHOULDER WITHOUT AND WITH CONTRAST  TECHNIQUE: Multiplanar, multisequence MR imaging was performed both before and after administration of intravenous contrast.  CONTRAST:  15mL MULTIHANCE GADOBENATE DIMEGLUMINE 529 MG/ML IV SOLN  COMPARISON:  12/28/2013  FINDINGS: Larger than usual field-of-view images were obtained. Accelerated  protocol head abuse due to motion artifact -the patient was unable to tolerate spending a lot of time in the magnet.  ROTATOR CUFF: Intact  MUSCLES: Abnormal edema within the distal trapezius, supraspinatus, anterior deltoid, and tracking along adjacent fat planes, below the clavicle, and also tracking between the infraspinatus and posterior deltoid. Abnormal associated enhancement noted.  BICEPS LONG HEAD: Intact  ACROMIOCLAVICULAR JOINT: Abnormal fluid in the [facility] joint with marginal enhancement. No definite osteomyelitis in the adjacent acromion and clavicle. No proximal humeral edema signal or abnormal enhancement.  GLENOHUMERAL JOINT:  No significant shoulder joint effusion.  LABRUM: Indeterminate due to large field of view, but no obvious tear seen.  BONES:  No compelling findings of osteomyelitis.  IMPRESSION: 1. Abnormal edema and enhancement in the distal trapezius, anterior deltoid, and supraspinatus muscle, and tracking along adjacent fat planes, below the clavicle, and also between the infraspinatus and posterior deltoid. Appearance suggests myositis and potentially fasciitis. No drainable abscess identified. No shoulder joint effusion. 2. Small amount of fluid in the [facility] joint, indeterminate  for infection, but without abnormal marrow enhancement the adjacent acromion or clavicle to further suggest septic AC joint.   Electronically Signed   By: Herbie [place] M.D.   On: 12/28/2013 19:33    Scheduled Meds: . cefTRIAXone (ROCEPHIN)  IV  2 g Intravenous Q24H  . enoxaparin (LOVENOX) injection  40 mg Subcutaneous Q24H  . famotidine  20 mg Oral BID  . ferrous sulfate  325 mg Oral Q breakfast  . lidocaine  1 patch Transdermal Q24H  . sodium chloride  3 mL Intravenous Q12H   Continuous Infusions: . sodium chloride 100 mL/hr at 12/31/13 0234    Principal Problem:   Acute bacterial endocarditis - right sided Active Problems:   Secondary amenorrhea   Bacterial myositis   IV drug abuse   Anemia   Thrombocytopenia, unspecified   Cigarette smoker   Hepatitis C antibody test positive   Hypokalemia   UTI (urinary tract infection)    Time spent: 40 minutes   [facility]  [facility] Pager [phone]. If 8PM-8AM, please contact night-coverage at [email or web address], password [facility] 12/31/2013, 5:35 PM  LOS: 3 days

## 2014-01-01 LAB — URINE CULTURE
Colony Count: NO GROWTH
Culture: NO GROWTH

## 2014-01-01 MED ORDER — ORITAVANCIN DIPHOSPHATE 400 MG IV SOLR
1200.0000 mg | Freq: Once | INTRAVENOUS | Status: AC
  Administered 2014-01-01: 1200 mg via INTRAVENOUS
  Filled 2014-01-01 (×2): qty 120

## 2014-01-01 NOTE — Progress Notes (Signed)
Patient ID: Melanie Crane, female   DOB: [birth year], 38 y.o.   MRN: [id number]         [facility]    Date of Admission:  12/28/2013           Day 5 antibiotics  Principal Problem:   Acute bacterial endocarditis - right sided Active Problems:   Bacterial myositis   IV drug abuse   Secondary amenorrhea   Anemia   Thrombocytopenia, unspecified   Cigarette smoker   Hepatitis C antibody test positive   Hypokalemia   UTI (urinary tract infection)   . enoxaparin (LOVENOX) injection  40 mg Subcutaneous Q24H  . famotidine  20 mg Oral BID  . ferrous sulfate  325 mg Oral Q breakfast  . lidocaine  1 patch Transdermal Q24H  . sodium chloride  3 mL Intravenous Q12H    Subjective: She states that her pain in her right shoulder has improved. She is eager to go home. She does not have a current PCP.  Objective: Temp:  [97.8 F (36.6 C)-98.5 F (36.9 C)] 98.5 F (36.9 C) (07/23 0453) Pulse Rate:  [73-79] 79 (07/23 0453) Resp:  [16] 16 (07/23 0453) BP: (125-144)/(87-97) 144/97 mmHg (07/23 0453) SpO2:  [97 %-98 %] 97 % (07/23 0453)  General: She is in no distress.  Skin: No rash Lungs: Clear Cor: Regular S1 and S2 with no murmurs Abdomen: Soft and nontender Joints and extremities: Right shoulder is bandaged. Her range of motion is improving  Lab Results Lab Results  Component Value Date   WBC 6.6 12/31/2013   HGB 8.8* 12/31/2013   HCT 27.3* 12/31/2013   MCV 80.8 12/31/2013   PLT 173 12/31/2013    Lab Results  Component Value Date   CREATININE 0.67 12/31/2013   BUN 6 12/31/2013   NA 142 12/31/2013   K 3.6* 12/31/2013   CL 104 12/31/2013   CO2 26 12/31/2013    Lab Results  Component Value Date   ALT 75* 12/29/2013   AST 16 12/29/2013   ALKPHOS 107 12/29/2013   BILITOT 0.3 12/29/2013      Microbiology: Recent Results (from the past 240 hour(s))  CULTURE, BLOOD (ROUTINE X 2)     Status: None   Collection Time    12/28/13 12:38 AM      Result  Value Ref Range Status   Specimen Description BLOOD LEFT FOREARM   Final   Special Requests BOTTLES DRAWN AEROBIC AND ANAEROBIC [facility] EACH   Final   Culture  Setup Time     Final   Value: 12/28/2013 18:07     Performed at [facility]   Culture     Final   Value: GROUP B STREP(S.AGALACTIAE)ISOLATED     Note: Gram Stain Report Called to,Read Back By and Verified With:  Kandra Nicolas RN @ 1217AM VINCJ     Performed at [facility]   Report Status 12/31/2013 FINAL   Final   Organism ID, Bacteria GROUP B STREP(S.AGALACTIAE)ISOLATED   Final  CULTURE, BLOOD (ROUTINE X 2)     Status: None   Collection Time    12/28/13 12:45 AM      Result Value Ref Range Status   Specimen Description BLOOD RIGHT ARM   Final   Special Requests BOTTLES DRAWN AEROBIC AND ANAEROBIC 10CC EACH   Final   Culture  Setup Time     Final   Value: 12/28/2013 18:07     Performed at  [facility]     Final   Value: GROUP B STREP(S.AGALACTIAE)ISOLATED     Note: CRITICAL RESULT CALLED TO, READ BACK BY AND VERIFIED WITH: JULIE P. RN @ 1217AM VINCJ 12/29/13 SUSCEPTIBILITIES PERFORMED ON PREVIOUS CULTURE WITHIN THE LAST 5 DAYS.     Performed at [facility]   Report Status 12/31/2013 FINAL   Final  MRSA PCR SCREENING     Status: None   Collection Time    12/28/13  4:44 AM      Result Value Ref Range Status   MRSA by PCR NEGATIVE  NEGATIVE Final   Comment:            The GeneXpert MRSA Assay (FDA     approved for NASAL specimens     only), is one component of a     comprehensive MRSA colonization     surveillance program. It is not     intended to diagnose MRSA     infection nor to guide or     monitor treatment for     MRSA infections.  BODY FLUID CULTURE     Status: None   Collection Time    12/29/13  3:33 PM      Result Value Ref Range Status   Specimen Description FLUID SYNOVIAL RIGHT SHOULDER   Final   Special Requests NONE   Final   Gram Stain     Final   Value: RARE WBC  PRESENT, PREDOMINANTLY MONONUCLEAR     NO ORGANISMS SEEN     Performed at [facility]   Culture     Final   Value: NO GROWTH 3 DAYS     Performed at [facility]   Report Status PENDING   Incomplete  ANAEROBIC CULTURE     Status: None   Collection Time    12/29/13  3:33 PM      Result Value Ref Range Status   Specimen Description FLUID SYNOVIAL RIGHT SHOULDER   Final   Special Requests NONE   Final   Gram Stain     Final   Value: RARE WBC PRESENT, PREDOMINANTLY MONONUCLEAR     NO SQUAMOUS EPITHELIAL CELLS SEEN     NO ORGANISMS SEEN     Performed at [facility]   Culture     Final   Value: NO ANAEROBES ISOLATED; CULTURE IN PROGRESS FOR 5 DAYS     Performed at [facility]   Report Status PENDING   Incomplete  CULTURE, BLOOD (ROUTINE X 2)     Status: None   Collection Time    12/30/13  7:42 AM      Result Value Ref Range Status   Specimen Description BLOOD RIGHT ANTECUBITAL   Final   Special Requests BOTTLES DRAWN AEROBIC AND ANAEROBIC 10CC   Final   Culture  Setup Time     Final   Value: 12/30/2013 11:56     Performed at [facility]   Culture     Final   Value:        BLOOD CULTURE RECEIVED NO GROWTH TO DATE CULTURE WILL BE HELD FOR 5 DAYS BEFORE ISSUING A FINAL NEGATIVE REPORT     Performed at [facility]   Report Status PENDING   Incomplete  CULTURE, BLOOD (ROUTINE X 2)     Status: None   Collection Time    12/30/13  7:50 AM      Result Value  Ref Range Status   Specimen Description BLOOD RIGHT WRIST   Final   Special Requests BOTTLES DRAWN AEROBIC AND ANAEROBIC 10CC   Final   Culture  Setup Time     Final   Value: 12/30/2013 11:56     [facility]   Culture     Final   Value:        BLOOD CULTURE RECEIVED NO GROWTH TO DATE CULTURE WILL BE HELD FOR 5 DAYS BEFORE ISSUING A FINAL NEGATIVE REPORT     [facility]   Report Status PENDING   Incomplete  URINE CULTURE     Status:  None   Collection Time    12/31/13  2:51 AM      Result Value Ref Range Status   Specimen Description URINE, RANDOM   Final   Special Requests NONE   Final   Culture  Setup Time     Final   Value: 12/31/2013 04:43     [facility]   Colony Count     Final   Value: NO GROWTH     [facility]   Culture     Final   Value: NO GROWTH     [facility]   Report Status 01/01/2014 FINAL   Final    Studies/Results: No results found.  Assessment: She has group B streptococcal tricuspid valve endocarditis and probable seeding of her right shoulder from her bacteremia. . Her ceftriaxone today and give her a single dose of long-acting oritavancin to complete her therapy.  Plan: 1. Discontinue ceftriaxone and administer oritavancin 1200 mg IV x1 dose 2. She needs a primary care provider to manage her pain 3. I will arrange followup in my clinic 4. I will sign off now but please call if I can be of further assistance  Cliffton AstersJohn Timothy Trudell, MD [facility] [phone] pager   [phone] cell 01/01/2014, 11:07 AM

## 2014-01-01 NOTE — Progress Notes (Signed)
[facility] PROGRESS NOTE  Melanie RavelingJoann Theresa Crane ZOX:[id number] DOB: 09/02/1975 DOA: 12/28/2013 PCP: No PCP Per Patient  Assessment/Plan: Principal Problem:   Acute bacterial endocarditis - right sided Active Problems:   Secondary amenorrhea   Bacterial myositis   IV drug abuse   Anemia   Thrombocytopenia, unspecified   Cigarette smoker   Hepatitis C antibody test positive   Hypokalemia   UTI (urinary tract infection)    *Acute bacterial endocarditis: echo shows tricuspid vegetations. Admits to injecting opana for 2 months. Blood cultures grew GBS. abx per ID. Cardiology has consulted CT surgery. No signs of right-sided heart failure, no tricuspid regurgitation, no indications for surgical intervention at this time  Cardiothoracic surgery Recommended CT angiogram to rule out septic emboli  CT surgery also recommended transesophageal echo to rule out patent foramen ovale or signs of left-sided endocarditis  Cardiology recommends TEE only if suspected right-sided failure or evidence of left-sided embolic events  Infectious disease recommend continued medical therapy, did not feel strongly about CT scan or TEE  Change vancomycin to Rocephin, consider giving single dose of oritavancin  Await results of repeat blood cultures and right shoulder cultures  Septic shock resolved  Infectious disease Discontinue ceftriaxone and administer oritavancin 1200 mg IV x1 dose She needs a primary care provider to manage her pain Followup with infectious disease outpatient Anticipate discharge home tomorrow  [facility]ated by [facility]o. Mainly clot, but culture pending.  Orthopedics following,Do not feel that any type of surgical intervention would benefit her related to the right shoulder   IV drug abuse: Patient reports that she has been injecting Opana for 2 months. She has a long history of abusing oral opioids and was in prison for 5 years. Patient voices desire  to quit using, but this will likely complicate her disposition. social work consult pending for drug treatment options. Patient does have an acute indication for pain medication, but will need to be weaned off eventually. HIV negative    Hep C ab positive. Not a candidate for treatment at this time so no need for further workup  Anemia: Likely iron deficiency and infection. oral iron started  Thrombocytopenia, unspecified  Hypokalemia: recheck in am      Code Status: full  Family Communication:  Disposition Plan: Anticipate discharge tomorrow Consultants:  Orthopedics, Dr. Luiz BlareGraves  Infectious disease  Cardiology, [facility]  [facility] Procedures:  Aspiration right AC joint Antibiotics:  Per ID HPI/Subjective:  Shoulder pain slightly improved. Wondering when she can go home         HPI/Subjective: No complaints  Objective: Filed Vitals:   12/31/13 1300 12/31/13 1959 01/01/14 0453 01/01/14 1446  BP: 125/87 137/88 144/97 133/84  Pulse: 73 76 79 105  Temp: 97.8 F (36.6 C) 98.4 F (36.9 C) 98.5 F (36.9 C) 98.4 F (36.9 C)  TempSrc:   Oral Oral  Resp: 16 16 16    Height:      Weight:      SpO2: 98% 98% 97% 98%    Intake/Output Summary (Last 24 hours) at 01/01/14 1455 Last data filed at 01/01/14 1237  Gross per 24 hour  Intake    580 ml  Output      0 ml  Net    580 ml    Exam:  HENT:  Head: Atraumatic.  Nose: Nose normal.  Mouth/Throat: Oropharynx is clear and moist.  Eyes: Conjunctivae are normal. Pupils are equal, round, and reactive to light. No  scleral icterus.  Neck: Neck supple. No tracheal deviation present.  Cardiovascular: Normal rate, regular rhythm, normal heart sounds and intact distal pulses.  Pulmonary/Chest: Effort normal and breath sounds normal. No respiratory distress.  Abdominal: Soft. Normal appearance and bowel sounds are normal. She exhibits no distension. There is no tenderness.  Musculoskeletal: She exhibits no edema and no tenderness.   Neurological: She is alert. No cranial nerve deficit.    Data Reviewed: Basic Metabolic Panel:  Recent Labs Lab 12/28/13 0042 12/28/13 0546 12/29/13 1005 12/31/13 0540  NA 133* 142 140 142  K 3.8 3.5* 3.3* 3.6*  CL 93* 106 104 104  CO2 22 22 23 26   GLUCOSE 112* 107* 116* 97  BUN 16 14 8 6   CREATININE 0.76 0.72 0.62 0.67  CALCIUM 8.1* 7.7* 8.0* 8.4  MG  --   --   --  1.6    Liver Function Tests:  Recent Labs Lab 12/28/13 0042 12/28/13 0546 12/29/13 1005  AST 41* 34 16  ALT 148* 113* 75*  ALKPHOS 146* 119* 107  BILITOT 0.6 0.6 0.3  PROT 6.6 5.6* 5.9*  ALBUMIN 2.5* 2.0* 2.1*   No results found for this basename: LIPASE, AMYLASE,  in the last 168 hours No results found for this basename: AMMONIA,  in the last 168 hours  CBC:  Recent Labs Lab 12/28/13 0042 12/28/13 0546 12/29/13 1005 12/31/13 0540  WBC 9.5 6.8 6.4 6.6  NEUTROABS 6.1 3.8 4.1  --   HGB 9.7* 9.0* 8.8* 8.8*  HCT 30.4* 27.7* 27.2* 27.3*  MCV 78.8 79.8 80.2 80.8  PLT 125* 103* 111* 173    Cardiac Enzymes:  Recent Labs Lab 12/29/13 1005  CKTOTAL 48   BNP (last 3 results) No results found for this basename: PROBNP,  in the last 8760 hours   CBG: No results found for this basename: GLUCAP,  in the last 168 hours  Recent Results (from the past 240 hour(s))  CULTURE, BLOOD (ROUTINE X 2)     Status: None   Collection Time    12/28/13 12:38 AM      Result Value Ref Range Status   Specimen Description BLOOD LEFT FOREARM   Final   Special Requests BOTTLES DRAWN AEROBIC AND ANAEROBIC [facility] EACH   Final   Culture  Setup Time     Final   Value: 12/28/2013 18:07     Performed at [facility]   Culture     Final   Value: GROUP B STREP(S.AGALACTIAE)ISOLATED     Note: Gram Stain Report Called to,Read Back By and Verified With:  Kandra Nicolas RN @ 1217AM VINCJ     Performed at [facility]   Report Status 12/31/2013 FINAL   Final   Organism ID, Bacteria GROUP B  STREP(S.AGALACTIAE)ISOLATED   Final  CULTURE, BLOOD (ROUTINE X 2)     Status: None   Collection Time    12/28/13 12:45 AM      Result Value Ref Range Status   Specimen Description BLOOD RIGHT ARM   Final   Special Requests BOTTLES DRAWN AEROBIC AND ANAEROBIC 10CC EACH   Final   Culture  Setup Time     Final   Value: 12/28/2013 18:07     Performed at [facility]   Culture     Final   Value: GROUP B STREP(S.AGALACTIAE)ISOLATED     Note: CRITICAL RESULT CALLED TO, READ BACK BY AND VERIFIED WITH: JULIE P. RN @ 1217AM VINCJ 12/29/13 SUSCEPTIBILITIES  PERFORMED ON PREVIOUS CULTURE WITHIN THE LAST 5 DAYS.     Performed at [facility]   Report Status 12/31/2013 FINAL   Final  MRSA PCR SCREENING     Status: None   Collection Time    12/28/13  4:44 AM      Result Value Ref Range Status   MRSA by PCR NEGATIVE  NEGATIVE Final   Comment:            The GeneXpert MRSA Assay (FDA     approved for NASAL specimens     only), is one component of a     comprehensive MRSA colonization     surveillance program. It is not     intended to diagnose MRSA     infection nor to guide or     monitor treatment for     MRSA infections.  BODY FLUID CULTURE     Status: None   Collection Time    12/29/13  3:33 PM      Result Value Ref Range Status   Specimen Description FLUID SYNOVIAL RIGHT SHOULDER   Final   Special Requests NONE   Final   Gram Stain     Final   Value: RARE WBC PRESENT, PREDOMINANTLY MONONUCLEAR     NO ORGANISMS SEEN     Performed at [facility]   Culture     Final   Value: NO GROWTH 3 DAYS     Performed at [facility]   Report Status PENDING   Incomplete  ANAEROBIC CULTURE     Status: None   Collection Time    12/29/13  3:33 PM      Result Value Ref Range Status   Specimen Description FLUID SYNOVIAL RIGHT SHOULDER   Final   Special Requests NONE   Final   Gram Stain     Final   Value: RARE WBC PRESENT, PREDOMINANTLY MONONUCLEAR     NO SQUAMOUS  EPITHELIAL CELLS SEEN     NO ORGANISMS SEEN     Performed at [facility]   Culture     Final   Value: NO ANAEROBES ISOLATED; CULTURE IN PROGRESS FOR 5 DAYS     Performed at [facility]   Report Status PENDING   Incomplete  CULTURE, BLOOD (ROUTINE X 2)     Status: None   Collection Time    12/30/13  7:42 AM      Result Value Ref Range Status   Specimen Description BLOOD RIGHT ANTECUBITAL   Final   Special Requests BOTTLES DRAWN AEROBIC AND ANAEROBIC 10CC   Final   Culture  Setup Time     Final   Value: 12/30/2013 11:56     Performed at [facility]   Culture     Final   Value:        BLOOD CULTURE RECEIVED NO GROWTH TO DATE CULTURE WILL BE HELD FOR 5 DAYS BEFORE ISSUING A FINAL NEGATIVE REPORT     Performed at [facility]   Report Status PENDING   Incomplete  CULTURE, BLOOD (ROUTINE X 2)     Status: None   Collection Time    12/30/13  7:50 AM      Result Value Ref Range Status   Specimen Description BLOOD RIGHT WRIST   Final   Special Requests BOTTLES DRAWN AEROBIC AND ANAEROBIC 10CC   Final   Culture  Setup Time     Final   Value:  12/30/2013 11:56     Performed at [facility]   Culture     Final   Value:        BLOOD CULTURE RECEIVED NO GROWTH TO DATE CULTURE WILL BE HELD FOR 5 DAYS BEFORE ISSUING A FINAL NEGATIVE REPORT     Performed at [facility]   Report Status PENDING   Incomplete  URINE CULTURE     Status: None   Collection Time    12/31/13  2:51 AM      Result Value Ref Range Status   Specimen Description URINE, RANDOM   Final   Special Requests NONE   Final   Culture  Setup Time     Final   Value: 12/31/2013 04:43     Performed at [facility] Count     Final   Value: NO GROWTH     Performed at [facility]   Culture     Final   Value: NO GROWTH     Performed at [facility]   Report Status 01/01/2014 FINAL   Final     Studies: Dg Chest 1 View  12/28/2013   CLINICAL DATA:   Right shoulder and chest pain. Pain with deep inspiration.  EXAM: CHEST - 1 VIEW  COMPARISON:  None.  FINDINGS: The lungs are well-aerated. Mild bibasilar opacities may reflect atelectasis or possibly mild pneumonia. There is no evidence of pleural effusion or pneumothorax.  The cardiomediastinal silhouette is within normal limits. No acute osseous abnormalities are seen. The right glenohumeral joint is grossly unremarkable in appearance.  IMPRESSION: Mild bibasilar opacities may reflect atelectasis or possibly mild pneumonia.   Electronically Signed   By: Roanna Raider M.D.   On: 12/28/2013 01:21   Dg Shoulder Right  12/28/2013   CLINICAL DATA:  Right shoulder pain, radiating to neck and chest. Difficulty moving right shoulder.  EXAM: RIGHT SHOULDER - 2+ VIEW  COMPARISON:  None.  FINDINGS: There is no evidence of fracture or dislocation. The right humeral head is seated within the glenoid fossa. The acromioclavicular joint is unremarkable in appearance. No significant soft tissue abnormalities are seen. The visualized portions of the right lung are clear.  IMPRESSION: No evidence of fracture or dislocation.   Electronically Signed   By: Roanna Raider M.D.   On: 12/28/2013 01:20   US Abdomen Complete  12/28/2013   CLINICAL DATA:  Elevated LFTs.  EXAM: ULTRASOUND ABDOMEN COMPLETE  COMPARISON:  None.  FINDINGS: Gallbladder:  Relatively contracted. A 1.4 cm focus of increased echogenicity is noted at the gallbladder fundus, which may reflect an adenomatous polyp, tumefactive sludge or less likely a stone. No posterior acoustic shadowing is seen. No ultrasonographic Murphy's sign is elicited. No pericholecystic fluid is seen.  Common bile duct:  Diameter: 0.7 cm, borderline prominent, without evidence of distal obstruction.  Liver:  A small 1.3 cm focus of increased echogenicity within the left hepatic lobe may reflect a hemangioma. No additional mass is seen. Within normal limits in parenchymal echogenicity.   IVC:  No abnormality visualized.  Pancreas:  Visualized portion unremarkable.  Spleen:  Enlarged, measuring 16.7 cm in length, with a volume of 1240 mL.  Right Kidney:  Length: 14.4 cm. Echogenicity within normal limits. No mass or hydronephrosis visualized.  Left Kidney:  Length: 14.1 cm. Echogenicity within normal limits. No mass or hydronephrosis visualized.  Abdominal aorta:  No aneurysm visualized.  Other findings:  A 1.0 cm lymph node  adjacent to the pancreatic head is borderline normal in size.  IMPRESSION: 1. 1.3 cm focus of increased echogenicity within the left hepatic lobe is thought to reflect an hemangioma. Liver otherwise unremarkable in appearance. Would consider follow-up LFTs to ensure stability. 2. 1.4 cm focus of increased echogenicity noted at the gallbladder fundus. This may reflect an adenomatous polyp or tumefactive sludge. No posterior acoustic shadowing is seen, suggesting against a stone. No evidence for obstruction or cholecystitis. Endoscopic ultrasound could be considered for further evaluation, as deemed clinically appropriate. 3. Splenomegaly noted.   Electronically Signed   By: Roanna Raider M.D.   On: 12/28/2013 04:57   Mr Shoulder Right W Wo Contrast  12/28/2013   CLINICAL DATA:  Painful shoulder, query infection.  EXAM: MRI OF THE RIGHT SHOULDER WITHOUT AND WITH CONTRAST  TECHNIQUE: Multiplanar, multisequence MR imaging was performed both before and after administration of intravenous contrast.  CONTRAST:  15mL MULTIHANCE GADOBENATE DIMEGLUMINE 529 MG/ML IV SOLN  COMPARISON:  12/28/2013  FINDINGS: Larger than usual field-of-view images were obtained. Accelerated protocol head abuse due to motion artifact -the patient was unable to tolerate spending a lot of time in the magnet.  ROTATOR CUFF: Intact  MUSCLES: Abnormal edema within the distal trapezius, supraspinatus, anterior deltoid, and tracking along adjacent fat planes, below the clavicle, and also tracking between the  infraspinatus and posterior deltoid. Abnormal associated enhancement noted.  BICEPS LONG HEAD: Intact  ACROMIOCLAVICULAR JOINT: Abnormal fluid in the [facility] joint with marginal enhancement. No definite osteomyelitis in the adjacent acromion and clavicle. No proximal humeral edema signal or abnormal enhancement.  GLENOHUMERAL JOINT:  No significant shoulder joint effusion.  LABRUM: Indeterminate due to large field of view, but no obvious tear seen.  BONES:  No compelling findings of osteomyelitis.  IMPRESSION: 1. Abnormal edema and enhancement in the distal trapezius, anterior deltoid, and supraspinatus muscle, and tracking along adjacent fat planes, below the clavicle, and also between the infraspinatus and posterior deltoid. Appearance suggests myositis and potentially fasciitis. No drainable abscess identified. No shoulder joint effusion. 2. Small amount of fluid in the [facility] joint, indeterminate for infection, but without abnormal marrow enhancement the adjacent acromion or clavicle to further suggest septic AC joint.   Electronically Signed   By: Herbie Baltimore M.D.   On: 12/28/2013 19:33    Scheduled Meds: . enoxaparin (LOVENOX) injection  40 mg Subcutaneous Q24H  . famotidine  20 mg Oral BID  . ferrous sulfate  325 mg Oral Q breakfast  . lidocaine  1 patch Transdermal Q24H  . oritavancin (ORBACTIV) IVPB  1,200 mg Intravenous Once  . sodium chloride  3 mL Intravenous Q12H   Continuous Infusions: . 0.9 % NaCl with KCl 40 mEq / L 75 mL/hr (01/01/14 1110)    Principal Problem:   Acute bacterial endocarditis - right sided Active Problems:   Secondary amenorrhea   Bacterial myositis   IV drug abuse   Anemia   Thrombocytopenia, unspecified   Cigarette smoker   Hepatitis C antibody test positive   Hypokalemia   UTI (urinary tract infection)    Time spent: 40 minutes   [facility]  [facility] Pager [phone]. If 8PM-8AM, please contact night-coverage at [email or web address], password  [facility] 01/01/2014, 2:55 PM  LOS: 4 days

## 2014-01-02 ENCOUNTER — Telehealth: Payer: Self-pay | Admitting: General Practice

## 2014-01-02 LAB — BODY FLUID CULTURE: Culture: NO GROWTH

## 2014-01-02 NOTE — Progress Notes (Signed)
No further recommendation by Cardiology. Will sign off. Please call us if any further questions arise. Thank you for consulting us.  Ramond DialSigned, Franz Svec PA Pager: [phone]

## 2014-01-02 NOTE — Telephone Encounter (Signed)
Called with HFU appt info. LVM stating pt has walkin appt on 01/05/14 at 9:00am.

## 2014-01-02 NOTE — Progress Notes (Signed)
Pt discharged to home. D/c instructions given, no questions verbalized. Vitals stable. 

## 2014-01-02 NOTE — Discharge Summary (Signed)
Physician Discharge Summary  Melanie Crane MRN: [id number] DOB/AGE: 1976-02-09 38 y.o.  PCP: No PCP Per Patient   Admit date: 12/28/2013 Discharge date: 01/02/2014  Discharge Diagnoses:       Acute bacterial endocarditis - right sided Active Problems:   Secondary amenorrhea   Bacterial myositis   IV drug abuse   Anemia   Thrombocytopenia, unspecified   Cigarette smoker   Hepatitis C antibody test positive   Hypokalemia   UTI (urinary tract infection)   Followup recommendations    Medication List    Notice   You have not been prescribed any medications.      Discharge Condition: *Stable Disposition: Final discharge disposition not confirmed   Consults:  Cardiothoracic surgery Cardiology Infectious disease Orthopedics  Significant Diagnostic Studies: Dg Chest 1 View  12/28/2013   CLINICAL DATA:  Right shoulder and chest pain. Pain with deep inspiration.  EXAM: CHEST - 1 VIEW  COMPARISON:  None.  FINDINGS: The lungs are well-aerated. Mild bibasilar opacities may reflect atelectasis or possibly mild pneumonia. There is no evidence of pleural effusion or pneumothorax.  The cardiomediastinal silhouette is within normal limits. No acute osseous abnormalities are seen. The right glenohumeral joint is grossly unremarkable in appearance.  IMPRESSION: Mild bibasilar opacities may reflect atelectasis or possibly mild pneumonia.   Electronically Signed   By: Roanna Raider M.D.   On: 12/28/2013 01:21   Dg Shoulder Right  12/28/2013   CLINICAL DATA:  Right shoulder pain, radiating to neck and chest. Difficulty moving right shoulder.  EXAM: RIGHT SHOULDER - 2+ VIEW  COMPARISON:  None.  FINDINGS: There is no evidence of fracture or dislocation. The right humeral head is seated within the glenoid fossa. The acromioclavicular joint is unremarkable in appearance. No significant soft tissue abnormalities are seen. The visualized portions of the right lung are clear.   IMPRESSION: No evidence of fracture or dislocation.   Electronically Signed   By: Roanna Raider M.D.   On: 12/28/2013 01:20   US Abdomen Complete  12/28/2013   CLINICAL DATA:  Elevated LFTs.  EXAM: ULTRASOUND ABDOMEN COMPLETE  COMPARISON:  None.  FINDINGS: Gallbladder:  Relatively contracted. A 1.4 cm focus of increased echogenicity is noted at the gallbladder fundus, which may reflect an adenomatous polyp, tumefactive sludge or less likely a stone. No posterior acoustic shadowing is seen. No ultrasonographic Murphy's sign is elicited. No pericholecystic fluid is seen.  Common bile duct:  Diameter: 0.7 cm, borderline prominent, without evidence of distal obstruction.  Liver:  A small 1.3 cm focus of increased echogenicity within the left hepatic lobe may reflect a hemangioma. No additional mass is seen. Within normal limits in parenchymal echogenicity.  IVC:  No abnormality visualized.  Pancreas:  Visualized portion unremarkable.  Spleen:  Enlarged, measuring 16.7 cm in length, with a volume of 1240 mL.  Right Kidney:  Length: 14.4 cm. Echogenicity within normal limits. No mass or hydronephrosis visualized.  Left Kidney:  Length: 14.1 cm. Echogenicity within normal limits. No mass or hydronephrosis visualized.  Abdominal aorta:  No aneurysm visualized.  Other findings:  A 1.0 cm lymph node adjacent to the pancreatic head is borderline normal in size.  IMPRESSION: 1. 1.3 cm focus of increased echogenicity within the left hepatic lobe is thought to reflect an hemangioma. Liver otherwise unremarkable in appearance. Would consider follow-up LFTs to ensure stability. 2. 1.4 cm focus of increased echogenicity noted at the gallbladder fundus. This may reflect an adenomatous polyp or tumefactive  sludge. No posterior acoustic shadowing is seen, suggesting against a stone. No evidence for obstruction or cholecystitis. Endoscopic ultrasound could be considered for further evaluation, as deemed clinically appropriate. 3.  Splenomegaly noted.   Electronically Signed   By: Roanna Raider M.D.   On: 12/28/2013 04:57   Mr Shoulder Right W Wo Contrast  12/28/2013   CLINICAL DATA:  Painful shoulder, query infection.  EXAM: MRI OF THE RIGHT SHOULDER WITHOUT AND WITH CONTRAST  TECHNIQUE: Multiplanar, multisequence MR imaging was performed both before and after administration of intravenous contrast.  CONTRAST:  15mL MULTIHANCE GADOBENATE DIMEGLUMINE 529 MG/ML IV SOLN  COMPARISON:  12/28/2013  FINDINGS: Larger than usual field-of-view images were obtained. Accelerated protocol head abuse due to motion artifact -the patient was unable to tolerate spending a lot of time in the magnet.  ROTATOR CUFF: Intact  MUSCLES: Abnormal edema within the distal trapezius, supraspinatus, anterior deltoid, and tracking along adjacent fat planes, below the clavicle, and also tracking between the infraspinatus and posterior deltoid. Abnormal associated enhancement noted.  BICEPS LONG HEAD: Intact  ACROMIOCLAVICULAR JOINT: Abnormal fluid in the [facility] joint with marginal enhancement. No definite osteomyelitis in the adjacent acromion and clavicle. No proximal humeral edema signal or abnormal enhancement.  GLENOHUMERAL JOINT:  No significant shoulder joint effusion.  LABRUM: Indeterminate due to large field of view, but no obvious tear seen.  BONES:  No compelling findings of osteomyelitis.  IMPRESSION: 1. Abnormal edema and enhancement in the distal trapezius, anterior deltoid, and supraspinatus muscle, and tracking along adjacent fat planes, below the clavicle, and also between the infraspinatus and posterior deltoid. Appearance suggests myositis and potentially fasciitis. No drainable abscess identified. No shoulder joint effusion. 2. Small amount of fluid in the [facility] joint, indeterminate for infection, but without abnormal marrow enhancement the adjacent acromion or clavicle to further suggest septic AC joint.   Electronically Signed   By: Herbie Baltimore M.D.    On: 12/28/2013 19:33      Microbiology: Recent Results (from the past 240 hour(s))  CULTURE, BLOOD (ROUTINE X 2)     Status: None   Collection Time    12/28/13 12:38 AM      Result Value Ref Range Status   Specimen Description BLOOD LEFT FOREARM   Final   Special Requests BOTTLES DRAWN AEROBIC AND ANAEROBIC [facility] EACH   Final   Culture  Setup Time     Final   Value: 12/28/2013 18:07     Performed at [facility]   Culture     Final   Value: GROUP B STREP(S.AGALACTIAE)ISOLATED     Note: Gram Stain Report Called to,Read Back By and Verified With:  Kandra Nicolas RN @ 1217AM VINCJ     Performed at [facility]   Report Status 12/31/2013 FINAL   Final   Organism ID, Bacteria GROUP B STREP(S.AGALACTIAE)ISOLATED   Final  CULTURE, BLOOD (ROUTINE X 2)     Status: None   Collection Time    12/28/13 12:45 AM      Result Value Ref Range Status   Specimen Description BLOOD RIGHT ARM   Final   Special Requests BOTTLES DRAWN AEROBIC AND ANAEROBIC 10CC EACH   Final   Culture  Setup Time     Final   Value: 12/28/2013 18:07     Performed at [facility]   Culture     Final   Value: GROUP B STREP(S.AGALACTIAE)ISOLATED     Note: CRITICAL RESULT CALLED TO, READ BACK  BY AND VERIFIED WITH: JULIE P. RN @ 1217AM VINCJ 12/29/13 SUSCEPTIBILITIES PERFORMED ON PREVIOUS CULTURE WITHIN THE LAST 5 DAYS.     Performed at [facility]   Report Status 12/31/2013 FINAL   Final  MRSA PCR SCREENING     Status: None   Collection Time    12/28/13  4:44 AM      Result Value Ref Range Status   MRSA by PCR NEGATIVE  NEGATIVE Final   Comment:            The GeneXpert MRSA Assay (FDA     approved for NASAL specimens     only), is one component of a     comprehensive MRSA colonization     surveillance program. It is not     intended to diagnose MRSA     infection nor to guide or     monitor treatment for     MRSA infections.  BODY FLUID CULTURE     Status: None   Collection Time     12/29/13  3:33 PM      Result Value Ref Range Status   Specimen Description FLUID SYNOVIAL RIGHT SHOULDER   Final   Special Requests NONE   Final   Gram Stain     Final   Value: RARE WBC PRESENT, PREDOMINANTLY MONONUCLEAR     NO ORGANISMS SEEN     Performed at [facility]   Culture     Final   Value: NO GROWTH 3 DAYS     Performed at [facility]   Report Status PENDING   Incomplete  ANAEROBIC CULTURE     Status: None   Collection Time    12/29/13  3:33 PM      Result Value Ref Range Status   Specimen Description FLUID SYNOVIAL RIGHT SHOULDER   Final   Special Requests NONE   Final   Gram Stain     Final   Value: RARE WBC PRESENT, PREDOMINANTLY MONONUCLEAR     NO SQUAMOUS EPITHELIAL CELLS SEEN     NO ORGANISMS SEEN     Performed at [facility]   Culture     Final   Value: NO ANAEROBES ISOLATED; CULTURE IN PROGRESS FOR 5 DAYS     Performed at [facility]   Report Status PENDING   Incomplete  CULTURE, BLOOD (ROUTINE X 2)     Status: None   Collection Time    12/30/13  7:42 AM      Result Value Ref Range Status   Specimen Description BLOOD RIGHT ANTECUBITAL   Final   Special Requests BOTTLES DRAWN AEROBIC AND ANAEROBIC 10CC   Final   Culture  Setup Time     Final   Value: 12/30/2013 11:56     Performed at [facility]   Culture     Final   Value:        BLOOD CULTURE RECEIVED NO GROWTH TO DATE CULTURE WILL BE HELD FOR 5 DAYS BEFORE ISSUING A FINAL NEGATIVE REPORT     Performed at [facility]   Report Status PENDING   Incomplete  CULTURE, BLOOD (ROUTINE X 2)     Status: None   Collection Time    12/30/13  7:50 AM      Result Value Ref Range Status   Specimen Description BLOOD RIGHT WRIST   Final   Special Requests BOTTLES DRAWN AEROBIC AND ANAEROBIC 10CC   Final  Culture  Setup Time     Final   Value: 12/30/2013 11:56     [facility]   Culture     Final   Value:        BLOOD CULTURE RECEIVED NO  GROWTH TO DATE CULTURE WILL BE HELD FOR 5 DAYS BEFORE ISSUING A FINAL NEGATIVE REPORT     [facility]   Report Status PENDING   Incomplete  URINE CULTURE     Status: None   Collection Time    12/31/13  2:51 AM      Result Value Ref Range Status   Specimen Description URINE, RANDOM   Final   Special Requests NONE   Final   Culture  Setup Time     Final   Value: 12/31/2013 04:43     [facility]   Colony Count     Final   Value: NO GROWTH     [facility]   Culture     Final   Value: NO GROWTH     [facility]   Report Status 01/01/2014 FINAL   Final     Labs: No results found for this or any previous visit (from the past 48 hour(s)).   HPI : 38 [place] with history of IV drug abuse and long-standing tobacco abuse who states that she was in her usual state of health until last week when she began to experience swelling and pain in the right index finger followed by the development of severe pain in her right shoulder. Symptoms persisted ultimately prompting her to present to the emergency department 12/28/2013. She was noted to be febrile and hypotensive on admission, and routine blood work revealed microcytic anemia, thrombocytopenia, and abnormal liver function tests. Blood cultures were obtained and ultimately grew group B Streptococcus (Streptococcus agalactiae). An echocardiogram was performed demonstrating the presence of vegetations on the tricuspid valve with mild tricuspid regurgitation. The patient was seen in consultation by both the cardiology and infectious disease team's. Cardiothoracic surgical consultation was also requested because of endocarditis    Detailed HOSPITAL COURSE:  *Acute bacterial endocarditis/tricuspid valve endocarditis/: Echocardiogram was obtained on 12/29/2013 which showed EF 55-60%, normal wall motion, however it did  reveal at least 2 large mobile density on the ventricular side of tricuspid valve consistent with vegetation.   MRI of her right shoulder was obtained on 12/28/2013 showed abnormal edema over distal trapezius, anterior deltoid, supraspinatus muscle. She also has small [facility] concerning for septic joint. Secondary to injecting opana for 2 months. Blood cultures grew GBS Started on treatment with vancomycin and Rocephin, Both were discontinued prior to discharge and patient received oritavancin, a new antibiotic with a very long half-life giving therapeutic levels for approximately 3 weeks against strep and staph. She is not a good candidate for a PICC and outpatient IV antibiotics because of her injecting drug use.   Cardiology and CT surgery consulted for endocarditis, they both recommended medical management, no surgery planned at this time without absolute indication of right-sided heart failure, or failure to respond to medications, or evidence of left-sided embolic events or abscess. No signs of right-sided heart failure, no tricuspid regurgitation, no indications for surgical intervention at this time  Cardiothoracic surgery Recommended CT angiogram to rule out septic emboli to the lung, TEE to rule out patent foramen ovale or signs of  left-sided endocarditis  Cardiology recommends TEE only if suspected right-sided failure or evidence of left-sided embolic events   Infectious disease recommend continued medical therapy, did not feel strongly about CT scan or TEE   Septic shock resolved   She needs a primary care provider to manage her pain  Followup with infectious disease outpatient , Dr. Orvan Falconer, information provided Information about rehabilitation, detox provided by social worker        Small amount of fluid in the [facility] joint: aspirated by ortho. Mainly clot, but culture pending.  Orthopedics following,Do not feel that any type of surgical intervention would  benefit her related to the right shoulder    IV drug abuse: Patient reports that she has been injecting Opana for 2 months. She has a long history of abusing oral opioids and was in prison for 5 years. Patient voices desire to quit using, but this will likely complicate her disposition. social work consult obtained for drug treatment options.    HIV negative  Patient would need to establish with the pain clinic, PCP   Hep C ab positive. Not a candidate for treatment at this time so no need for further workup, PCP to follow LFTs   Anemia: Likely iron deficiency and infection. oral iron started  Thrombocytopenia, unspecified  Hypokalemia: Resolved       Discharge Exam:   Blood pressure 113/74, pulse 89, temperature 98.6 F (37 C), temperature source Oral, resp. rate 16, height 5\' 4"  (1.626 m), weight 70.2 kg (154 lb 12.2 oz), SpO2 96.00%.   General: She is in no distress.  Skin: No rash  Lungs: Clear  Cor: Regular S1 and S2 with no murmurs  Abdomen: Soft and nontender  Joints and extremities: Right shoulder is bandaged. Her range of motion is improving       Discharge Instructions   Diet - low sodium heart healthy    Complete by:  As directed      Increase activity slowly    Complete by:  As directed            Follow-up Information   Follow up with PCP. Schedule an appointment as soon as possible for a visit in 1 week. [facility] followup)       Signed: Richarda Overlie 01/02/2014, 11:37 AM

## 2014-01-03 LAB — ANAEROBIC CULTURE

## 2014-01-05 LAB — CULTURE, BLOOD (ROUTINE X 2)
CULTURE: NO GROWTH
Culture: NO GROWTH

## 2014-01-05 LAB — HCV RNA QUANT RFLX ULTRA OR GENOTYP
HCV QUANT LOG: 4.97 {Log} — AB (ref <1.18)
HCV Quantitative: 93436 IU/mL — ABNORMAL HIGH (ref <15)

## 2014-01-07 LAB — HEPATITIS C GENOTYPE

## 2014-01-28 ENCOUNTER — Ambulatory Visit: Payer: Self-pay | Admitting: Internal Medicine

## 2014-03-12 HISTORY — PX: TRICUSPID VALVE REPLACEMENT: SHX816

## 2015-04-10 ENCOUNTER — Inpatient Hospital Stay (HOSPITAL_COMMUNITY)
Admission: EM | Admit: 2015-04-10 | Discharge: 2015-04-19 | DRG: 314 | Disposition: A | Discharge status: Skilled Nursing Facility | Payer: Self-pay | Attending: Internal Medicine | Admitting: Internal Medicine

## 2015-04-10 ENCOUNTER — Emergency Department (HOSPITAL_COMMUNITY): Payer: Self-pay

## 2015-04-10 ENCOUNTER — Encounter (HOSPITAL_COMMUNITY): Payer: Self-pay | Admitting: Family Medicine

## 2015-04-10 DIAGNOSIS — T826XXA Infection and inflammatory reaction due to cardiac valve prosthesis, initial encounter: Principal | ICD-10-CM | POA: Diagnosis present

## 2015-04-10 DIAGNOSIS — A419 Sepsis, unspecified organism: Secondary | ICD-10-CM

## 2015-04-10 DIAGNOSIS — Y831 Surgical operation with implant of artificial internal device as the cause of abnormal reaction of the patient, or of later complication, without mention of misadventure at the time of the procedure: Secondary | ICD-10-CM | POA: Diagnosis present

## 2015-04-10 DIAGNOSIS — F17210 Nicotine dependence, cigarettes, uncomplicated: Secondary | ICD-10-CM | POA: Diagnosis present

## 2015-04-10 DIAGNOSIS — D696 Thrombocytopenia, unspecified: Secondary | ICD-10-CM | POA: Diagnosis present

## 2015-04-10 DIAGNOSIS — J852 Abscess of lung without pneumonia: Secondary | ICD-10-CM | POA: Insufficient documentation

## 2015-04-10 DIAGNOSIS — R079 Chest pain, unspecified: Secondary | ICD-10-CM

## 2015-04-10 DIAGNOSIS — E871 Hypo-osmolality and hyponatremia: Secondary | ICD-10-CM | POA: Diagnosis present

## 2015-04-10 DIAGNOSIS — R52 Pain, unspecified: Secondary | ICD-10-CM

## 2015-04-10 DIAGNOSIS — I76 Septic arterial embolism: Secondary | ICD-10-CM | POA: Diagnosis present

## 2015-04-10 DIAGNOSIS — D594 Other nonautoimmune hemolytic anemias: Secondary | ICD-10-CM

## 2015-04-10 DIAGNOSIS — I330 Acute and subacute infective endocarditis: Secondary | ICD-10-CM | POA: Diagnosis present

## 2015-04-10 DIAGNOSIS — D638 Anemia in other chronic diseases classified elsewhere: Secondary | ICD-10-CM | POA: Diagnosis present

## 2015-04-10 DIAGNOSIS — J851 Abscess of lung with pneumonia: Secondary | ICD-10-CM | POA: Diagnosis present

## 2015-04-10 DIAGNOSIS — D508 Other iron deficiency anemias: Secondary | ICD-10-CM | POA: Insufficient documentation

## 2015-04-10 DIAGNOSIS — I2699 Other pulmonary embolism without acute cor pulmonale: Secondary | ICD-10-CM | POA: Insufficient documentation

## 2015-04-10 DIAGNOSIS — F1910 Other psychoactive substance abuse, uncomplicated: Secondary | ICD-10-CM | POA: Diagnosis present

## 2015-04-10 DIAGNOSIS — I38 Endocarditis, valve unspecified: Secondary | ICD-10-CM

## 2015-04-10 DIAGNOSIS — I2690 Septic pulmonary embolism without acute cor pulmonale: Secondary | ICD-10-CM | POA: Diagnosis present

## 2015-04-10 DIAGNOSIS — D649 Anemia, unspecified: Secondary | ICD-10-CM | POA: Diagnosis present

## 2015-04-10 DIAGNOSIS — R0602 Shortness of breath: Secondary | ICD-10-CM

## 2015-04-10 DIAGNOSIS — B1920 Unspecified viral hepatitis C without hepatic coma: Secondary | ICD-10-CM | POA: Diagnosis present

## 2015-04-10 HISTORY — DX: Unspecified viral hepatitis C without hepatic coma: B19.20

## 2015-04-10 HISTORY — DX: Complete or unspecified spontaneous abortion without complication: O03.9

## 2015-04-10 LAB — HEPATIC FUNCTION PANEL
ALK PHOS: 99 U/L (ref 38–126)
ALT: 39 U/L (ref 14–54)
AST: 33 U/L (ref 15–41)
Albumin: 2.7 g/dL — ABNORMAL LOW (ref 3.5–5.0)
BILIRUBIN INDIRECT: 0.9 mg/dL (ref 0.3–0.9)
BILIRUBIN TOTAL: 1.2 mg/dL (ref 0.3–1.2)
Bilirubin, Direct: 0.3 mg/dL (ref 0.1–0.5)
TOTAL PROTEIN: 7.1 g/dL (ref 6.5–8.1)

## 2015-04-10 LAB — BASIC METABOLIC PANEL
Anion gap: 10 (ref 5–15)
BUN: 9 mg/dL (ref 6–20)
CHLORIDE: 97 mmol/L — AB (ref 101–111)
CO2: 24 mmol/L (ref 22–32)
CREATININE: 0.85 mg/dL (ref 0.44–1.00)
Calcium: 8.4 mg/dL — ABNORMAL LOW (ref 8.9–10.3)
Glucose, Bld: 141 mg/dL — ABNORMAL HIGH (ref 65–99)
Potassium: 4 mmol/L (ref 3.5–5.1)
SODIUM: 131 mmol/L — AB (ref 135–145)

## 2015-04-10 LAB — I-STAT TROPONIN, ED: TROPONIN I, POC: 0.02 ng/mL (ref 0.00–0.08)

## 2015-04-10 LAB — URINALYSIS, ROUTINE W REFLEX MICROSCOPIC
BILIRUBIN URINE: NEGATIVE
Glucose, UA: NEGATIVE mg/dL
HGB URINE DIPSTICK: NEGATIVE
Ketones, ur: NEGATIVE mg/dL
Leukocytes, UA: NEGATIVE
Nitrite: NEGATIVE
PH: 7.5 (ref 5.0–8.0)
Protein, ur: NEGATIVE mg/dL
SPECIFIC GRAVITY, URINE: 1.01 (ref 1.005–1.030)
UROBILINOGEN UA: 4 mg/dL — AB (ref 0.0–1.0)

## 2015-04-10 LAB — DIFFERENTIAL
Basophils Absolute: 0 10*3/uL (ref 0.0–0.1)
Basophils Relative: 0 %
EOS ABS: 0 10*3/uL (ref 0.0–0.7)
EOS PCT: 0 %
Lymphocytes Relative: 13 %
Lymphs Abs: 1.9 10*3/uL (ref 0.7–4.0)
Monocytes Absolute: 1.4 10*3/uL — ABNORMAL HIGH (ref 0.1–1.0)
Monocytes Relative: 10 %
NEUTROS PCT: 77 %
Neutro Abs: 10.6 10*3/uL — ABNORMAL HIGH (ref 1.7–7.7)

## 2015-04-10 LAB — I-STAT CG4 LACTIC ACID, ED
LACTIC ACID, VENOUS: 0.68 mmol/L (ref 0.5–2.0)
Lactic Acid, Venous: 1.29 mmol/L (ref 0.5–2.0)

## 2015-04-10 LAB — CBC
HCT: 34.2 % — ABNORMAL LOW (ref 36.0–46.0)
HEMOGLOBIN: 10.6 g/dL — AB (ref 12.0–15.0)
MCH: 23.3 pg — ABNORMAL LOW (ref 26.0–34.0)
MCHC: 31 g/dL (ref 30.0–36.0)
MCV: 75.3 fL — ABNORMAL LOW (ref 78.0–100.0)
Platelets: 147 10*3/uL — ABNORMAL LOW (ref 150–400)
RBC: 4.54 MIL/uL (ref 3.87–5.11)
RDW: 18.2 % — ABNORMAL HIGH (ref 11.5–15.5)
WBC: 15.2 10*3/uL — ABNORMAL HIGH (ref 4.0–10.5)

## 2015-04-10 LAB — POC URINE PREG, ED: PREG TEST UR: NEGATIVE

## 2015-04-10 MED ORDER — ACETAMINOPHEN 325 MG PO TABS
650.0000 mg | ORAL_TABLET | Freq: Once | ORAL | Status: AC
  Administered 2015-04-10: 650 mg via ORAL
  Filled 2015-04-10: qty 2

## 2015-04-10 MED ORDER — HEPARIN (PORCINE) IN NACL 100-0.45 UNIT/ML-% IJ SOLN
1100.0000 [IU]/h | INTRAMUSCULAR | Status: DC
  Administered 2015-04-10: 1100 [IU]/h via INTRAVENOUS
  Filled 2015-04-10: qty 250

## 2015-04-10 MED ORDER — PIPERACILLIN-TAZOBACTAM 3.375 G IVPB
3.3750 g | Freq: Three times a day (TID) | INTRAVENOUS | Status: DC
  Administered 2015-04-11: 3.375 g via INTRAVENOUS
  Filled 2015-04-10 (×3): qty 50

## 2015-04-10 MED ORDER — HYDROMORPHONE HCL 1 MG/ML IJ SOLN
1.0000 mg | Freq: Once | INTRAMUSCULAR | Status: AC
  Administered 2015-04-10: 1 mg via INTRAVENOUS
  Filled 2015-04-10: qty 1

## 2015-04-10 MED ORDER — SODIUM CHLORIDE 0.9 % IV BOLUS (SEPSIS)
500.0000 mL | INTRAVENOUS | Status: AC
  Administered 2015-04-10: 500 mL via INTRAVENOUS

## 2015-04-10 MED ORDER — VANCOMYCIN HCL IN DEXTROSE 1-5 GM/200ML-% IV SOLN: 1000.0000 mg | Freq: Once | INTRAVENOUS | Status: DC

## 2015-04-10 MED ORDER — SODIUM CHLORIDE 0.9 % IV BOLUS (SEPSIS)
1000.0000 mL | INTRAVENOUS | Status: AC
  Administered 2015-04-10 (×2): 1000 mL via INTRAVENOUS

## 2015-04-10 MED ORDER — MORPHINE SULFATE (PF) 2 MG/ML IV SOLN
2.0000 mg | Freq: Once | INTRAVENOUS | Status: AC
  Administered 2015-04-10: 2 mg via INTRAVENOUS
  Filled 2015-04-10: qty 1

## 2015-04-10 MED ORDER — VANCOMYCIN HCL 10 G IV SOLR
1500.0000 mg | Freq: Once | INTRAVENOUS | Status: AC
  Administered 2015-04-10: 1500 mg via INTRAVENOUS
  Filled 2015-04-10: qty 1500

## 2015-04-10 MED ORDER — PIPERACILLIN-TAZOBACTAM 3.375 G IVPB 30 MIN
3.3750 g | Freq: Once | INTRAVENOUS | Status: AC
  Administered 2015-04-10: 3.375 g via INTRAVENOUS
  Filled 2015-04-10: qty 50

## 2015-04-10 MED ORDER — HEPARIN BOLUS VIA INFUSION
4000.0000 [IU] | Freq: Once | INTRAVENOUS | Status: AC
  Administered 2015-04-10: 4000 [IU] via INTRAVENOUS
  Filled 2015-04-10: qty 4000

## 2015-04-10 MED ORDER — IOHEXOL 350 MG/ML SOLN
80.0000 mL | Freq: Once | INTRAVENOUS | Status: AC | PRN
  Administered 2015-04-10: 80 mL via INTRAVENOUS

## 2015-04-10 MED ORDER — VANCOMYCIN HCL IN DEXTROSE 750-5 MG/150ML-% IV SOLN
750.0000 mg | Freq: Three times a day (TID) | INTRAVENOUS | Status: DC
  Administered 2015-04-11 – 2015-04-12 (×6): 750 mg via INTRAVENOUS
  Filled 2015-04-10 (×8): qty 150

## 2015-04-10 NOTE — Progress Notes (Signed)
ANTICOAGULATION CONSULT NOTE - Initial Consult  Pharmacy Consult for heparin Indication: pulmonary embolus  No Known Allergies  Patient Measurements: Height: 5\' 4"  (162.6 cm) Weight: 160 lb (72.576 kg) IBW/kg (Calculated) : 54.7 Heparin Dosing Weight: 70kg  Vital Signs: Temp: 101.2 F (38.4 C) (10/29 1919) Temp Source: Rectal (10/29 1919) BP: 102/64 mmHg (10/29 2105) Pulse Rate: 84 (10/29 2105)  Labs:  Recent Labs  04/10/15 1819  HGB 10.6*  HCT 34.2*  PLT 147*  CREATININE 0.85    Estimated Creatinine Clearance: 86.8 mL/min (by C-G formula based on Cr of 0.85).   Medical History: Past Medical History  Diagnosis Date  . Anemia   . IVDU (intravenous drug user)   . Acute bacterial endocarditis - right sided 12/29/2013    Group B Streptococcus   Assessment: 39 y.o. female with a PMHx of R sided bacterial endocarditis/tricuspid vegetations, continued IVDU (injects Opana), and anemia, who presents to the ED with complaints of gradual onset chest pain and shortness of breath 2 days. CTA shows left sided PE and multiple pulmonary nodules which could mean mets or septic emboli. New orders to start heparin for PE. Hgb on the low side.  Goal of Therapy:  Heparin level 0.3-0.7 units/ml Monitor platelets by anticoagulation protocol: Yes   Plan:  Give 4000 units bolus x 1 Start heparin infusion at 1100 units/hr Check anti-Xa level in 6 hours and daily while on heparin Continue to monitor H&H and platelets  Sheppard CoilFrank Wilson PharmD., BCPS Clinical Pharmacist Pager [phone] 04/10/2015 9:42 PM

## 2015-04-10 NOTE — H&P (Signed)
History and Physical  Patient Name: Melanie Crane     ZHY:[id number]    DOB: 1976-04-21    DOA: 04/10/2015 Referring physician: Azalia Bilis, MD PCP: No PCP Per Patient      Chief Complaint: Chest pain  HPI: Melanie Crane is a 39 y.o. 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  The patient lives in [place] but has been back in [place] visiting family for Thanksgiving. Although she reports being in recovery for parts of this year she's been injecting oxymorphone 3 times a day again, and was having no problems at all until yesterday while sitting on the couch, she had sudden onset RIGHT sided chest pain and shortness of breath. She denies preceding indolent fevers, sweats, weight loss, leg swelling or previous chest pain since an episode of pneumonia in [place] in April.  Today, the recent presented to the ER where she had fever to 101.56F, tachycardia, tachypnea, hyponatremia, leukocytosis, and a CT angiogram showed LEFT sided PE at the level of the lobar artery as well as a RIGHT sided 5 x 4 cm cavitary irregular lesion As well as numerous irregular bilateral upper lobe nodules.  she was started on IV antibiotics and TRH were asked to admit for septic pulmonary embolus and lung abscess.      Review of Systems:  Pt complains of severe chest pain, cough, shortness of breath. Pt denies any sputum, indolent fevers, weight loss, chills or rigors, hemoptysis, confusion, focal weakness, passing out, difficulty walking, slurred speech.  All other systems negative except as just noted or noted in the history of present illness.  No Known Allergies  Prior to Admission medications   None    Past Medical History  Diagnosis Date  . Anemia   . IVDU (intravenous drug user)   . Acute bacterial endocarditis - right sided 12/29/2013    Group B Streptococcus     . Hepatitis C, not treated   . Miscarriage     Five times    Past Surgical History  Procedure Laterality Date  . Bunionectomy    . Tonsillectomy    . Tricuspid valve replacement      Family history: includes CAD in her mother; Hyperlipidemia in her mother; Hypertension in her father; Kidney failure in her father, although patient denies any family history when asked.  Social History: Patient lives in [place], [place].  She is unemployed.  She have three children who live with their grandparents here in [place].  She is visiting family this month.  She is an active smoker. She does not drink alcohol. She uses Opana 40 mg 3 times a day intravenously.        Physical Exam: BP 112/79 mmHg  Pulse 86  Temp(Src) 98.2 F (36.8 C) (Oral)  Resp 24  Ht  (1.626 m)  Wt 72.576 kg (160 lb)  BMI 27.45 kg/m2  SpO2 98%  LMP 03/27/2015 (Within Days) General appearance: Well-developed, adult female, alert and in moderate distress from pain.   Eyes: Anicteric, conjunctiva pink, lids and lashes normal.     ENT: No nasal deformity, discharge, or epistaxis.  OP moist without lesions.   Skin: Warm and dry.  No jaundice.  No suspicious rashes or lesions. Cardiac: Tachycardic, regular, nl S1-S2, no murmurs appreciated on my exam.  Capillary refill is brisk.  JVP normal.  No LE edema.  Radial and DP pulses 2+ and symmetric. Respiratory: Tachypneic.  Diminished at right base.  Rales on right.  Extreme pain with lying supine. Abdomen: Abdomen soft without rigidity.  No TTP. No ascites, distension.   MSK: No deformities or effusions. Neuro: Sensorium intact and responding to questions, attention normal.  Speech is fluent.  Moves all extremities equally and with normal coordination.    Psych: Behavior appropriate.  Affect constrained.  No evidence of aural or visual hallucinations or delusions.       Labs on Admission:  The metabolic panel shows hyponatremia, with normal potassium, bicarbonate, and renal  function. Blood glucose is normal. The transaminases and bilirubin are normal. The albumin is low at 2.7 g/dL. Initial troponin is negative. A hCG is negative.  The urinalysis is unremarkable. The lactic acid level is normal.  The complete blood count shows leukocytosis 15.2 K per UL, anemia 10.6 g/dL, and thrombocytopenia    Radiological Exams on Admission: Personally reviewed: Dg Chest 2 View 04/10/2015  Multifocal opacities, better described below.    Ct Angio Chest Pe W/cm &/or Wo Cm 04/10/2015 There is filling defect in the left lower lobe pulmonary artery at the hilum extending into numerous branches.  Posteriorly in the right lower lobe superior segment, there is a 5.2. x 3.8 x 4.5 cm well-defined rounded pleural-based abnormality that consist of consolidation mixed with areas of air attenuation. The walls of this lesion are mildly irregular and there is mild surrounding inflammatory change. There are multiple irregular right upper lobe pulmonary nodules  In the left upper lobe there is an irregular 7 mm pulmonary nodule There is consolidation in the anterior lateral aspect of the left lower lobe at the level of the diaphragm. The liver may also be enlarged. Musculoskeletal: No acute or focally suspicious findings.     EKG: Independently reviewed. Sinus tachycardia with right axis deviation.     Assessment/Plan  1. Pulmonary embolus:  This is new.  Discussed with the patient that her risk for bleeding given her IV drug use and known emboli in the lungs.   This has been discussed by phone with pulmonology who recommended intracranial imaging to rule out brain emboli before heparin, which the patient declined because of the severe discomfort she currently feels with lying supine and the fact that we have no known left-sided emboli at this time.  -Heparin gtt -Echocardiogram   2. Septic emboli with cavitating lung lesion and suspected endocarditis:  This is new.   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  This hospitalization was complicated by a PICC line with an upper extremity DVT.  -TTE -Repeat blood cultures for a set of 4 -Continue vancomycin and piperacillin-tazobactam -Consult to ID  3. Anemia:  This is worse from August when the patient's Hgb was 12.1 g/dL in [place], [place].  Suspect sequestration and/or shearing from endocarditis.  -LDH and haptoglobin -Reticulocyte count -Peripheral smear -Trend CBC on heparin    4. Hepatitis C:  The patient had an elevated hep C Quant, genotype 1a when she was admitted here for endocarditis. She denies ever getting treatment. -HIV antibody  5. IVDU:  The patient seems open to hearing about treatment options on discharge.  6. Hyponatremia: Suspect this is from infection. -Serum and urine osmolality  7. 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   [place] [facility] [place] she had specifically "lupus anticoagulant" and that she has been told to take a blood thinner and definitely. -Consult to hematology for recommendations regarding lupus anticoagulant testing while on heparin if possible      DVT PPx: Lovenox Diet: Regular Consultants: ID and hematology Code Status: Full  Medical decision making: What exists of the patient's previous chart was reviewed in depth and the case was discussed with Dr. Patria Maneampos and PCCM by phone. Patient seen 11:33 PM on 04/10/2015.  Disposition Plan:  Admit for heparin and IV antibiotics.  Echo pending.  Consult to ID and Heme and social  work.      Alberteen SamChristopher P Kenn Rekowski [facility] Pager [phone]

## 2015-04-10 NOTE — Progress Notes (Signed)
ANTIBIOTIC CONSULT NOTE - INITIAL  Pharmacy Consult for vancomycin, zosyn Indication: rule out sepsis  No Known Allergies  Patient Measurements: Height: 5\' 4"  (162.6 cm) Weight: 160 lb (72.576 kg) IBW/kg (Calculated) : 54.7  Vital Signs: Temp: 101.2 F (38.4 C) (10/29 1919) Temp Source: Rectal (10/29 1919) BP: 113/81 mmHg (10/29 1800) Pulse Rate: 114 (10/29 1800) Intake/Output from previous day:   Intake/Output from this shift:    Labs:  Recent Labs  04/10/15 1819  WBC 15.2*  HGB 10.6*  PLT 147*  CREATININE 0.85   Estimated Creatinine Clearance: 86.8 mL/min (by C-G formula based on Cr of 0.85). No results for input(s): VANCOTROUGH, VANCOPEAK, VANCORANDOM, GENTTROUGH, GENTPEAK, GENTRANDOM, TOBRATROUGH, TOBRAPEAK, TOBRARND, AMIKACINPEAK, AMIKACINTROU, AMIKACIN in the last 72 hours.   Microbiology: No results found for this or any previous visit (from the past 720 hour(s)).  Medical History: Past Medical History  Diagnosis Date  . Anemia   . IVDU (intravenous drug user)   . 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 WBC 15.2, AF  Vanc 10/29>> Zosyn 10/29>>  Blood x 2 Urine  Renal: SCr 0.85  Goal of Therapy:  Vancomycin trough level 15-20 mcg/ml  Plan:  Zosyn 3.375 gm IV q8h Vancomycin 1500 mg x 1 then 750 mg q8h Monitor cultures, renal fx, vt prn  Isaac BlissMichael Latissa Frick, PharmD, BCPS Clinical Pharmacist Pager [phone] 04/10/2015 7:26 PM

## 2015-04-10 NOTE — ED Notes (Signed)
Pt here for 2 days of constant right sided chest pain, SOB. Hurts when she breathes and coughs.

## 2015-04-10 NOTE — ED Notes (Signed)
Patient transported to CT 

## 2015-04-10 NOTE — ED Provider Notes (Signed)
CSN: [id number]     Arrival date & time 04/10/15  1754 History   First MD Initiated Contact with Patient 04/10/15 1836     Chief Complaint  Patient presents with  . Chest Pain  . Shortness of Breath     (Consider location/radiation/quality/duration/timing/severity/associated sxs/prior Treatment) HPI Comments: Melanie Crane is a 39 y.o. female with a PMHx of R sided bacterial endocarditis/tricuspid vegetations, continued IVDU (injects Opana), and anemia, who presents to the ED with complaints of gradual onset chest pain and shortness of breath 2 days. She reports the chest pain is 10/10 constant right sided nonradiating sharp pain worse with breathing or coughing and improved with injected Opana. Associated symptoms include fever with Tmax 99.4, diaphoresis/sweats, shortness of breath, and nonproductive cough. She continues to use IV drugs. She reports that this feels somewhat similar to last years episode with endocarditis. She is a current smoker.  She denies any leg swelling, recent travel/surgery/immobilization, estrogen use, history of DVT/PE, URI symptoms including rhinorrhea and sore throat and ear pain/drainage, abdominal pain, nausea, vomiting, diarrhea, constipation, dysuria, hematuria, numbness, tingling, weakness, claudication, orthopnea, or wheezing. She denies any known family history of cardiac disease.  Patient is a 39 y.o. female presenting with chest pain and shortness of breath. The history is provided by the patient and medical records. No language interpreter was used.  Chest Pain Pain location:  R lateral chest Pain quality: sharp   Pain radiates to:  Does not radiate Pain radiates to the back: no   Pain severity:  Severe Onset quality:  Gradual Duration:  2 days Timing:  Constant Progression:  Unchanged Chronicity:  Recurrent Context: at rest   Relieved by: injected opana. Worsened by:  Deep breathing and coughing Ineffective treatments:  None  tried Associated symptoms: cough, diaphoresis, fever (Tmax 99.4) and shortness of breath   Associated symptoms: no abdominal pain, no claudication, no lower extremity edema, no nausea, no numbness, no orthopnea, no syncope, not vomiting and no weakness   Risk factors: smoking   Risk factors: no birth control, no diabetes mellitus, no high cholesterol, no hypertension, no immobilization, no prior DVT/PE and no surgery   Risk factors comment:  +IVDU Shortness of Breath Associated symptoms: chest pain, cough, diaphoresis and fever (Tmax 99.4)   Associated symptoms: no abdominal pain, no claudication, no ear pain, no sore throat, no syncope, no vomiting and no wheezing     Past Medical History  Diagnosis Date  . Anemia   . IVDU (intravenous drug user)   . 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 Bunionectomy    . Tonsillectomy     Family History  Problem Relation Age of Onset  . Hypertension Father   . Kidney failure Father   . Hyperlipidemia Mother   . CAD Mother    Social History  Substance Use Topics  . Smoking status: Current Every Day Smoker    Types: Cigarettes  . Smokeless tobacco: Never Used     Comment: 1 1/2 ppd  . Alcohol Use: No   OB History    No data available     Review of Systems  Constitutional: Positive for fever (Tmax 99.4), chills and diaphoresis.  HENT: Negative for ear discharge, ear pain, rhinorrhea and sore throat.   Respiratory: Positive for cough and shortness of breath. Negative for wheezing.   Cardiovascular: Positive for chest pain. Negative for orthopnea,  claudication, leg swelling and syncope.  Gastrointestinal: Negative for nausea, vomiting, abdominal pain, diarrhea and constipation.  Genitourinary: Negative for dysuria and hematuria.  Musculoskeletal: Negative for myalgias and arthralgias.  Skin: Negative for color change.  Allergic/Immunologic:  Negative for immunocompromised state.  Neurological: Negative for weakness, light-headedness and numbness.  Psychiatric/Behavioral: Negative for confusion.   10 Systems reviewed and are negative for acute change except as noted in the HPI.    Allergies  Review of patient's allergies indicates no known allergies.  Home Medications   Prior to Admission medications   Not on File   BP 113/81 mmHg  Pulse 114  Temp(Src) 99.3 F (37.4 C) (Oral)  Resp 18  SpO2 94%  LMP 03/27/2015 (Within Days) Physical Exam  Constitutional: She is oriented to person, place, and time. She appears well-developed and well-nourished.  Non-toxic appearance. She appears ill.  Low-grade temp 99.3, recheck rectal temp 101.2, nontoxic but ill appearing with tachycardia and sweaty skin  HENT:  Head: Normocephalic and atraumatic.  Mouth/Throat: Oropharynx is clear and moist and mucous membranes are normal.  Eyes: Conjunctivae and EOM are normal. Right eye exhibits no discharge. Left eye exhibits no discharge.  Neck: Normal range of motion. Neck supple.  Cardiovascular: Regular rhythm, S1 normal, S2 normal and intact distal pulses.  Tachycardia present.  Exam reveals no gallop and no friction rub.   Murmur heard.  Systolic murmur is present  Tachycardic in the low 100s, nl s1/s2, ?systolic murmur, no rub/gallops, distal pulses intact, no pedal edema   Pulmonary/Chest: Effort normal and breath sounds normal. No respiratory distress. She has no decreased breath sounds. She has no wheezes. She has no rhonchi. She has no rales. She exhibits no tenderness, no crepitus, no deformity and no retraction.  CTAB in all lung fields, no w/r/r, no increased WOB, speaking in full sentences, coughs intermittently throughout exam, SpO2 94% on RA  No chest wall TTP, crepitus, retractions, or deformities  Abdominal: Soft. Normal appearance and bowel sounds are normal. She exhibits no distension. There is no tenderness. There is no  rigidity, no rebound, no guarding, no CVA tenderness, no tenderness at McBurney's point and negative Murphy's sign.  Musculoskeletal: Normal range of motion.  MAE x4 Strength and sensation grossly intact Distal pulses intact No pedal edema, neg homan's bilaterally   Neurological: She is alert and oriented to person, place, and time. She has normal strength. No sensory deficit.  Skin: Skin is warm and intact. No rash noted. She is diaphoretic.  Sweaty/diaphoretic  Psychiatric: She has a normal mood and affect.  Nursing note and vitals reviewed.   ED Course  Procedures (including critical care time)  CRITICAL CARE- sepsis Performed by: Ramond Marrow   Total critical care time: 30 minutes  Critical care time was exclusive of separately billable procedures and treating other patients.  Critical care was necessary to treat or prevent imminent or life-threatening deterioration.  Critical care was time spent personally by me on the following activities: development of treatment plan with patient and/or surrogate as well as nursing, discussions with consultants, evaluation of patient's response to treatment, examination of patient, obtaining history from patient or surrogate, ordering and performing treatments and interventions, ordering and review of laboratory studies, ordering and review of radiographic studies, pulse oximetry and re-evaluation of patient's condition.   Labs Review Labs Reviewed  BASIC METABOLIC PANEL - Abnormal; Notable for the following:    Sodium 131 (*)    Chloride 97 (*)    Glucose,  Bld 141 (*)    Calcium 8.4 (*)    All other components within normal limits  CBC - Abnormal; Notable for the following:    WBC 15.2 (*)    Hemoglobin 10.6 (*)    HCT 34.2 (*)    MCV 75.3 (*)    MCH 23.3 (*)    RDW 18.2 (*)    Platelets 147 (*)    All other components within normal limits  CULTURE, BLOOD (ROUTINE X 2)  CULTURE, BLOOD (ROUTINE X 2)  URINE  CULTURE  URINALYSIS, ROUTINE W REFLEX MICROSCOPIC (NOT AT [facility])  HEPATIC FUNCTION PANEL  DIFFERENTIAL  I-STAT TROPOININ, ED  I-STAT CG4 LACTIC ACID, ED  POC URINE PREG, ED    Imaging Review Dg Chest 2 View  04/10/2015  CLINICAL DATA:  Pain upper right breast towards medial border. 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 Chest pain and shortness breath for 2 days. EXAM: CHEST  2 VIEW COMPARISON:  Chest x-ray dated 12/28/2013. FINDINGS: Heart size is upper normal, not significantly changed compared to the previous exam. Overall cardiomediastinal silhouette is stable in size and configuration. Ill-defined opacities are seen within the right mid lung region and at the left lung base. A focal rounded nodular density overlying a mid thoracic vertebral body on the lateral view may be related to the right mid lung opacity. No pleural effusions seen. No pneumothorax. No acute osseous abnormality. IMPRESSION: 1. Ill-defined opacity within the right mid lung region with an additional smaller ill-defined opacity at the left lung base. This could represent asymmetric edema or pneumonia. 2. Focal nodular density overlying a mid thoracic vertebral body on the lateral view, measuring 1.3 cm greatest dimension, which may be related to the right mid lung opacity. A neoplastic process cannot be excluded. Recommend chest CT for further characterization. Electronically Signed   By: Bary Richard M.D.   On: 04/10/2015 18:34   I have personally reviewed and evaluated these images and lab results as part of my medical decision-making.   EKG Interpretation   Date/Time:  Saturday April 10 2015 17:57:03 EDT Ventricular Rate:  116 PR Interval:  122 QRS Duration: 78 QT Interval:  332 QTC Calculation: 461 R Axis:   93 Text Interpretation:  Sinus tachycardia Biatrial enlargement Rightward  axis Abnormal ECG No significant change since last tracing Confirmed by  Bebe Shaggy  MD, Dorinda Hill ([id number]) on 04/10/2015 6:40:33 PM       MDM   Final diagnoses:  Chest pain, unspecified chest pain type  SOB (shortness of breath)  IV drug abuse  Cigarette smoker  Anemia, unspecified anemia type  Thrombocytopenia, unspecified (HCC)  Leukocytosis  Sepsis, due to unspecified organism [facility])    39 y.o. female here with 2 days of R sided CP, SOB, worse with breathing and coughing. Tachycardic with low-grade temp of 99.3. Will get rectal temp. Trop neg, EKG with no significant change but shows BAE, sinus tachycardia, and RAD. BMP with Na 131, Cl 97, gluc 141. CBC with leukocytosis of 15.2, will obtain differential. Also shows chronic anemia and chronic thrombocytopenia. CXR showing opacity in R mid lung with another in the L lung base, which could represent edema vs PNA as well as a focal nodular density in mid right lung, recommends CT chest to further characterize. Pt diaphoretic/sweating on exam. No wheezing, no focal rhonchi/rales but with intermittent cough during exam. 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 She continues to use IV drugs. Give leukocytosis, low-grade temp, and tachycardia, will call code sepsis and start broad spec abx. Will get CT angio, as well as U/A, UCx, BCx, differential, LFTs, lactic, and Upreg. Will give some morphine for pain, given that she is a chronic drug abuser she will likely require slow weaning from narcotics. Will reassess shortly.   8:03 PM Lactic 1.29. All other labs/imaging still pending. Pt discussed with Dr. Patria Maneampos, who will take over care of pt at shift change. Please see his documentation for ongoing discussion of her ED course, results, and ultimate admission.   BP 113/81 mmHg  Pulse 114  Temp(Src) 101.2 F (38.4 C) (Rectal)  Resp 18  Ht 5\' 4"  (1.626 m)  Wt 160 lb (72.576 kg)  BMI 27.45 kg/m2  SpO2 94%  LMP 03/27/2015 (Within Days)  Medications  sodium chloride 0.9 %  bolus 1,000 mL (not administered)    Followed by  sodium chloride 0.9 % bolus 500 mL (not administered)  piperacillin-tazobactam (ZOSYN) IVPB 3.375 g (not administered)  morphine 2 MG/ML injection 2 mg (not administered)  acetaminophen (TYLENOL) tablet 650 mg (not administered)  piperacillin-tazobactam (ZOSYN) IVPB 3.375 g (not administered)  vancomycin (VANCOCIN) 1,500 mg in sodium chloride 0.9 % 500 mL IVPB (not administered)  vancomycin (VANCOCIN) IVPB 750 mg/150 ml premix (not administered)     Leda Bellefeuille Camprubi-Soms, PA-C 04/10/15 2006  Azalia BilisKevin Campos, MD 04/10/15 2130

## 2015-04-11 ENCOUNTER — Inpatient Hospital Stay (HOSPITAL_COMMUNITY): Payer: Self-pay

## 2015-04-11 DIAGNOSIS — I2699 Other pulmonary embolism without acute cor pulmonale: Secondary | ICD-10-CM

## 2015-04-11 DIAGNOSIS — I38 Endocarditis, valve unspecified: Secondary | ICD-10-CM | POA: Insufficient documentation

## 2015-04-11 DIAGNOSIS — I330 Acute and subacute infective endocarditis: Secondary | ICD-10-CM

## 2015-04-11 DIAGNOSIS — I2690 Septic pulmonary embolism without acute cor pulmonale: Secondary | ICD-10-CM

## 2015-04-11 DIAGNOSIS — J851 Abscess of lung with pneumonia: Secondary | ICD-10-CM

## 2015-04-11 LAB — BASIC METABOLIC PANEL
ANION GAP: 9 (ref 5–15)
BUN: 8 mg/dL (ref 6–20)
CALCIUM: 7.9 mg/dL — AB (ref 8.9–10.3)
CHLORIDE: 104 mmol/L (ref 101–111)
CO2: 21 mmol/L — AB (ref 22–32)
Creatinine, Ser: 0.77 mg/dL (ref 0.44–1.00)
GFR calc non Af Amer: >60 mL/min (ref >60)
Glucose, Bld: 111 mg/dL — ABNORMAL HIGH (ref 65–99)
POTASSIUM: 4.1 mmol/L (ref 3.5–5.1)
Sodium: 134 mmol/L — ABNORMAL LOW (ref 135–145)

## 2015-04-11 LAB — CBC
HCT: 32.2 % — ABNORMAL LOW (ref 36.0–46.0)
HEMOGLOBIN: 10 g/dL — AB (ref 12.0–15.0)
MCH: 23.7 pg — AB (ref 26.0–34.0)
MCHC: 31.1 g/dL (ref 30.0–36.0)
MCV: 76.3 fL — AB (ref 78.0–100.0)
Platelets: 128 10*3/uL — ABNORMAL LOW (ref 150–400)
RBC: 4.22 MIL/uL (ref 3.87–5.11)
RDW: 18.5 % — ABNORMAL HIGH (ref 11.5–15.5)
WBC: 13.9 10*3/uL — AB (ref 4.0–10.5)

## 2015-04-11 LAB — RETICULOCYTES
RBC.: 4.09 MIL/uL (ref 3.87–5.11)
RETIC COUNT ABSOLUTE: 49.1 10*3/uL (ref 19.0–186.0)
RETIC CT PCT: 1.2 % (ref 0.4–3.1)

## 2015-04-11 LAB — LACTATE DEHYDROGENASE: LDH: 217 U/L — ABNORMAL HIGH (ref 98–192)

## 2015-04-11 LAB — OSMOLALITY, URINE: OSMOLALITY UR: 238 mosm/kg — AB (ref 390–1090)

## 2015-04-11 LAB — HEPARIN LEVEL (UNFRACTIONATED): Heparin Unfractionated: <0.1 IU/mL — ABNORMAL LOW (ref 0.30–0.70)

## 2015-04-11 LAB — TECHNOLOGIST SMEAR REVIEW

## 2015-04-11 LAB — OSMOLALITY: OSMOLALITY: 288 mosm/kg (ref 275–300)

## 2015-04-11 LAB — HIV ANTIBODY (ROUTINE TESTING W REFLEX): HIV Screen 4th Generation wRfx: NONREACTIVE

## 2015-04-11 MED ORDER — HEPARIN (PORCINE) IN NACL 100-0.45 UNIT/ML-% IJ SOLN
3000.0000 [IU]/h | INTRAMUSCULAR | Status: DC
  Administered 2015-04-11: 1550 [IU]/h via INTRAVENOUS
  Administered 2015-04-12: 1750 [IU]/h via INTRAVENOUS
  Administered 2015-04-13: 2700 [IU]/h via INTRAVENOUS
  Administered 2015-04-13 – 2015-04-15 (×5): 2800 [IU]/h via INTRAVENOUS
  Administered 2015-04-16 (×2): 3000 [IU]/h via INTRAVENOUS
  Filled 2015-04-11 (×12): qty 250

## 2015-04-11 MED ORDER — HYDROMORPHONE HCL 1 MG/ML IJ SOLN
0.5000 mg | INTRAMUSCULAR | Status: DC | PRN
  Administered 2015-04-11 – 2015-04-15 (×28): 1 mg via INTRAVENOUS
  Administered 2015-04-15 (×4): 0.5 mg via INTRAVENOUS
  Administered 2015-04-16 (×2): 1 mg via INTRAVENOUS
  Filled 2015-04-11 (×35): qty 1

## 2015-04-11 MED ORDER — SODIUM CHLORIDE 0.9 % IJ SOLN
3.0000 mL | Freq: Two times a day (BID) | INTRAMUSCULAR | Status: DC
  Administered 2015-04-11 – 2015-04-16 (×7): 3 mL via INTRAVENOUS

## 2015-04-11 MED ORDER — INFLUENZA VAC SPLIT QUAD 0.5 ML IM SUSY
0.5000 mL | PREFILLED_SYRINGE | INTRAMUSCULAR | Status: AC
  Administered 2015-04-12: 0.5 mL via INTRAMUSCULAR
  Filled 2015-04-11: qty 0.5

## 2015-04-11 MED ORDER — HEPARIN BOLUS VIA INFUSION
4000.0000 [IU] | Freq: Once | INTRAVENOUS | Status: AC
  Administered 2015-04-11: 4000 [IU] via INTRAVENOUS
  Filled 2015-04-11: qty 4000

## 2015-04-11 MED ORDER — DEXTROSE 5 % IV SOLN
2.0000 g | INTRAVENOUS | Status: DC
  Administered 2015-04-11 – 2015-04-13 (×3): 2 g via INTRAVENOUS
  Filled 2015-04-11 (×3): qty 2

## 2015-04-11 MED ORDER — HEPARIN (PORCINE) IN NACL 100-0.45 UNIT/ML-% IJ SOLN
1350.0000 [IU]/h | INTRAMUSCULAR | Status: DC
  Administered 2015-04-11: 1350 [IU]/h via INTRAVENOUS
  Filled 2015-04-11: qty 250

## 2015-04-11 MED ORDER — SODIUM CHLORIDE 0.9 % IV SOLN
INTRAVENOUS | Status: DC
  Administered 2015-04-11 – 2015-04-18 (×7): via INTRAVENOUS

## 2015-04-11 MED ORDER — HYDROMORPHONE HCL 1 MG/ML IJ SOLN
0.5000 mg | INTRAMUSCULAR | Status: DC | PRN
  Administered 2015-04-11 (×2): 0.5 mg via INTRAVENOUS
  Filled 2015-04-11 (×2): qty 1

## 2015-04-11 MED ORDER — OXYCODONE HCL 5 MG PO TABS
10.0000 mg | ORAL_TABLET | ORAL | Status: DC | PRN
  Administered 2015-04-11 – 2015-04-18 (×38): 10 mg via ORAL
  Filled 2015-04-11 (×37): qty 2

## 2015-04-11 NOTE — Progress Notes (Signed)
ANTICOAGULATION CONSULT NOTE - Follow up Pharmacy Consult for heparin Indication: pulmonary embolus  No Known Allergies  Patient Measurements: Height: 5\' 4"  (162.6 cm) Weight: 161 lb 1.6 oz (73.074 kg) IBW/kg (Calculated) : 54.7 Heparin Dosing Weight: 70kg  Vital Signs: Temp: 98.9 F (37.2 C) (10/30 1300) Temp Source: Oral (10/30 1300) BP: 123/89 mmHg (10/30 1300) Pulse Rate: 95 (10/30 1300)  Labs:  Recent Labs  04/10/15 1819 04/11/15 0520 04/11/15 1736  HGB 10.6* 10.0*  --   HCT 34.2* 32.2*  --   PLT 147* 128*  --   HEPARINUNFRC  --  <0.10* <0.10*  CREATININE 0.85 0.77  --     Estimated Creatinine Clearance: 92.6 mL/min (by C-G formula based on Cr of 0.77).   Medical History: Past Medical History  Diagnosis Date  . Anemia   . IVDU (intravenous drug user)   . 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 Hepatitis C   . Miscarriage     Five times  Assessment: 2nd heparin level is <0.1, remains subtherapeutic despite heparin re-bolus and increased rate.  RN reports that heparin IV site looks okay but IV pump has alarmed a lot from patient bending arm. Patient has had numerous visitiors in room today.  RN will change Heparin IV site from right arm to left arm.  I discussed this with Dr. Randol KernElgergawy, that other option would be to change therapy to bivalirudin (angiomax) in case patient has anti-thrombin 3 deficiency.  Dr. Randol KernElgergawy wants to continue the IV heparin for now, changing to new IV site.   This is a 39 y.o. female with a PMHx of R sided bacterial endocarditis/tricuspid vegetations, continued IVDU (injects Opana), and anemia, who presented to the ED with complaints of gradual onset chest pain and shortness of breath 2 days. CTA showed left sided PE and multiple pulmonary nodules which could mean mets or septic emboli.   Goal of Therapy:  Heparin level 0.3-0.7 units/ml Monitor platelets by anticoagulation protocol: Yes    Plan:  Give 4000 unit bolus heparin  Increase heparin drip rate to 1550 units/hr 6 hour heparin level Daily HL, CBC   Melanie Crane, RPh Clinical Pharmacist Pager: [phone]  04/11/2015 6:53 PM

## 2015-04-11 NOTE — Consult Note (Addendum)
[facility] for Infectious Disease  Date of Admission:  04/10/2015  Date of Consult:  04/11/2015  Reason for Consult: Endocarditis Referring Physician: Elgergawi  Impression/Recommendation Endocarditis Septic Emboli Would- check BCx Check TTE, consider TEE Pain mgmt Change anbx to vanco/ceftriaxone check HIV The difficulty will be appropriate therapy (?PIC) in pt who is actively using IV drugs  IVDA I spoke to her about NA (narcotics anonymous).  She needs help with stopping  Pulmonary embolism Suspect this is from IE I am not sure of utility of anticoagulation  Hep C Check Hep C VL, genotype.  She can f/u for elastogram.   Thank you so much for this interesting consult,   Bobby Rumpf (pager) [phone] [email or web address]  Melanie Crane is an 39 y.o. female.  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  She returns to hospital now with night sweats and severe, pleuritic chest pain. She adm to using IVDA recently. In ED she had temp 101.2 , tachypnea, tachycardia, WBC of 15.2.  She had CT of chest which showed multiple nodules, lesions. She was started on vanco/zosyn.    Past Medical History  Diagnosis Date  . Anemia   . IVDU (intravenous drug user)   . 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 Hepatitis C   . Miscarriage     Five times    Past Surgical History  Procedure Laterality Date  . Bunionectomy    . Tonsillectomy    . Tricuspid valve replacement       No Known Allergies  Medications:  Scheduled: . [START ON 04/12/2015] Influenza vac split quadrivalent PF  0.5 mL Intramuscular Tomorrow-1000  . piperacillin-tazobactam (ZOSYN)  IV  3.375 g Intravenous Q8H  . sodium chloride  3 mL Intravenous Q12H  . vancomycin  750 mg Intravenous Q8H    Abtx:  Anti-infectives    Start     Dose/Rate Route Frequency Ordered Stop   04/11/15 0400   piperacillin-tazobactam (ZOSYN) IVPB 3.375 g     3.375 g 12.5 mL/hr over 240 Minutes Intravenous Every 8 hours 04/10/15 1925     04/11/15 0400  vancomycin (VANCOCIN) IVPB 750 mg/150 ml premix     750 mg 150 mL/hr over 60 Minutes Intravenous Every 8 hours 04/10/15 1925     04/10/15 1930  vancomycin (VANCOCIN) 1,500 mg in sodium chloride 0.9 % 500 mL IVPB     1,500 mg 250 mL/hr over 120 Minutes Intravenous  Once 04/10/15 1925 04/10/15 2235   04/10/15 1915  piperacillin-tazobactam (ZOSYN) IVPB 3.375 g     3.375 g 100 mL/hr over 30 Minutes Intravenous  Once 04/10/15 1910 04/10/15 2107   04/10/15 1915  vancomycin (VANCOCIN) IVPB 1000 mg/200 mL premix  Status:  Discontinued     1,000 mg 200 mL/hr over 60 Minutes Intravenous  Once 04/10/15 1910 04/10/15 1925      Total days of antibiotics: 1 vanco/zosyn          Social History:  reports that she has been smoking Cigarettes.  She has never used smokeless tobacco. She reports that she uses illicit drugs (IV). She reports that she does not drink alcohol.  Family History  Problem Relation Age of Onset  . Hypertension Father   . Kidney failure Father   . Hyperlipidemia Mother   . CAD Mother     General ROS: denies oral  sores, denies f/c, + night sweats, normal urine, normal BM, irregualr periods. no change in vision Please see HPI. 12 point ROS o/w (-)   Blood pressure 131/93, pulse 96, temperature 98.4 F (36.9 C), temperature source Oral, resp. rate 34, height _0  (1.626 m), weight 73.074 kg (161 lb 1.6 oz), last menstrual period 03/27/2015, SpO2 97 %. General appearance: alert, cooperative, mild distress and she is calm and in no distress when i enter the room. When she is engaged she is in severe pain.  Eyes: negative findings: conjunctivae and sclerae normal and pupils equal, round, reactive to light and accomodation Throat: normal findings: oropharynx pink & moist without lesions or evidence of thrush Neck: no adenopathy and  supple, symmetrical, trachea midline Lungs: diminished breath sounds bilaterally Heart: tachycardia Abdomen: normal findings: bowel sounds normal and soft, non-tender Extremities: edema none.  and no nail bed lesions. multiple small wounds on her UE.    Results for orders placed or performed during the hospital encounter of 04/10/15 (from the past 48 hour(s))  Basic metabolic panel     Status: Abnormal   Collection Time: 04/10/15  6:19 PM  Result Value Ref Range   Sodium 131 (L) 135 - 145 mmol/L   Potassium 4.0 3.5 - 5.1 mmol/L   Chloride 97 (L) 101 - 111 mmol/L   CO2 24 22 - 32 mmol/L   Glucose, Bld 141 (H) 65 - 99 mg/dL   BUN 9 6 - 20 mg/dL   Creatinine, Ser 0.85 0.44 - 1.00 mg/dL   Calcium 8.4 (L) 8.9 - 10.3 mg/dL   GFR calc non Af Amer >60 >60 mL/min   GFR calc Af Amer >60 >60 mL/min    Comment: (NOTE) The eGFR has been calculated using the CKD EPI equation. This calculation has not been validated in all clinical situations. eGFR's persistently <60 mL/min signify possible Chronic Kidney Disease.    Anion gap 10 5 - 15  CBC     Status: Abnormal   Collection Time: 04/10/15  6:19 PM  Result Value Ref Range   WBC 15.2 (H) 4.0 - 10.5 K/uL   RBC 4.54 3.87 - 5.11 MIL/uL   Hemoglobin 10.6 (L) 12.0 - 15.0 g/dL   HCT 34.2 (L) 36.0 - 46.0 %   MCV 75.3 (L) 78.0 - 100.0 fL   MCH 23.3 (L) 26.0 - 34.0 pg   MCHC 31.0 30.0 - 36.0 g/dL   RDW 18.2 (H) 11.5 - 15.5 %   Platelets 147 (L) 150 - 400 K/uL  I-stat troponin, ED     Status: None   Collection Time: 04/10/15  6:37 PM  Result Value Ref Range   Troponin i, poc 0.02 0.00 - 0.08 ng/mL   Comment 3            Comment: Due to the release kinetics of cTnI, a negative result within the first hours of the onset of symptoms does not rule out myocardial infarction with certainty. If myocardial infarction is still suspected, repeat the test at appropriate intervals.   Blood Culture (routine x 2)     Status: None (Preliminary result)    Collection Time: 04/10/15  7:30 PM  Result Value Ref Range   Specimen Description BLOOD RIGHT ARM    Special Requests BOTTLES DRAWN AEROBIC AND ANAEROBIC 5CC    Culture NO GROWTH < 12 HOURS    Report Status PENDING   Hepatic function panel     Status: Abnormal   Collection Time: 04/10/15  7:35 PM  Result Value Ref Range   Total Protein 7.1 6.5 - 8.1 g/dL   Albumin 2.7 (L) 3.5 - 5.0 g/dL   AST 33 15 - 41 U/L   ALT 39 14 - 54 U/L   Alkaline Phosphatase 99 38 - 126 U/L   Total Bilirubin 1.2 0.3 - 1.2 mg/dL   Bilirubin, Direct 0.3 0.1 - 0.5 mg/dL   Indirect Bilirubin 0.9 0.3 - 0.9 mg/dL  Differential     Status: Abnormal   Collection Time: 04/10/15  7:35 PM  Result Value Ref Range   Neutrophils Relative % 77 %   Neutro Abs 10.6 (H) 1.7 - 7.7 K/uL   Lymphocytes Relative 13 %   Lymphs Abs 1.9 0.7 - 4.0 K/uL   Monocytes Relative 10 %   Monocytes Absolute 1.4 (H) 0.1 - 1.0 K/uL   Eosinophils Relative 0 %   Eosinophils Absolute 0.0 0.0 - 0.7 K/uL   Basophils Relative 0 %   Basophils Absolute 0.0 0.0 - 0.1 K/uL  Blood Culture (routine x 2)     Status: None (Preliminary result)   Collection Time: 04/10/15  7:45 PM  Result Value Ref Range   Specimen Description BLOOD LEFT ARM    Special Requests BOTTLES DRAWN AEROBIC AND ANAEROBIC 5CC    Culture NO GROWTH < 12 HOURS    Report Status PENDING   Urinalysis, Routine w reflex microscopic (not at [facility])     Status: Abnormal   Collection Time: 04/10/15  7:50 PM  Result Value Ref Range   Color, Urine YELLOW YELLOW   APPearance CLEAR CLEAR   Specific Gravity, Urine 1.010 1.005 - 1.030   pH 7.5 5.0 - 8.0   Glucose, UA NEGATIVE NEGATIVE mg/dL   Hgb urine dipstick NEGATIVE NEGATIVE   Bilirubin Urine NEGATIVE NEGATIVE   Ketones, ur NEGATIVE NEGATIVE mg/dL   Protein, ur NEGATIVE NEGATIVE mg/dL   Urobilinogen, UA 4.0 (H) 0.0 - 1.0 mg/dL   Nitrite NEGATIVE NEGATIVE   Leukocytes, UA NEGATIVE NEGATIVE    Comment: MICROSCOPIC NOT DONE ON URINES  WITH NEGATIVE PROTEIN, BLOOD, LEUKOCYTES, NITRITE, OR GLUCOSE <1000 mg/dL.  POC urine preg, ED (not at [facility])     Status: None   Collection Time: 04/10/15  7:57 PM  Result Value Ref Range   Preg Test, Ur NEGATIVE NEGATIVE    Comment:        THE SENSITIVITY OF THIS METHODOLOGY IS >24 mIU/mL   I-Stat CG4 Lactic Acid, ED  (not at  [facility])     Status: None   Collection Time: 04/10/15  7:58 PM  Result Value Ref Range   Lactic Acid, Venous 1.29 0.5 - 2.0 mmol/L  I-Stat CG4 Lactic Acid, ED  (not at  [facility])     Status: None   Collection Time: 04/10/15 10:15 PM  Result Value Ref Range   Lactic Acid, Venous 0.68 0.5 - 2.0 mmol/L  Osmolality, urine     Status: Abnormal   Collection Time: 04/11/15  1:13 AM  Result Value Ref Range   Osmolality, Ur 238 (L) 390 - 1090 mOsm/kg    Comment: Performed at [facility]  Osmolality     Status: None   Collection Time: 04/11/15  1:42 AM  Result Value Ref Range   Osmolality 288 275 - 300 mOsm/kg    Comment: Performed at [facility]  Reticulocytes     Status: None   Collection Time: 04/11/15  1:42 AM  Result Value Ref Range  Retic Ct Pct 1.2 0.4 - 3.1 %   RBC. 4.09 3.87 - 5.11 MIL/uL   Retic Count, Manual 49.1 19.0 - 186.0 K/uL  Technologist smear review     Status: None   Collection Time: 04/11/15  1:42 AM  Result Value Ref Range   Tech Review ATYPICAL LYMPHOCYTES     Comment: ELLIPTOCYTES  Lactate dehydrogenase     Status: Abnormal   Collection Time: 04/11/15  1:42 AM  Result Value Ref Range   LDH 217 (H) 98 - 192 U/L  Heparin level (unfractionated)     Status: Abnormal   Collection Time: 04/11/15  5:20 AM  Result Value Ref Range   Heparin Unfractionated <0.10 (L) 0.30 - 0.70 IU/mL    Comment:        IF HEPARIN RESULTS ARE BELOW EXPECTED VALUES, AND PATIENT DOSAGE HAS BEEN CONFIRMED, SUGGEST FOLLOW UP TESTING OF ANTITHROMBIN III LEVELS.   CBC     Status: Abnormal   Collection Time: 04/11/15  5:20 AM  Result Value Ref Range    WBC 13.9 (H) 4.0 - 10.5 K/uL   RBC 4.22 3.87 - 5.11 MIL/uL   Hemoglobin 10.0 (L) 12.0 - 15.0 g/dL   HCT 32.2 (L) 36.0 - 46.0 %   MCV 76.3 (L) 78.0 - 100.0 fL   MCH 23.7 (L) 26.0 - 34.0 pg   MCHC 31.1 30.0 - 36.0 g/dL   RDW 18.5 (H) 11.5 - 15.5 %   Platelets 128 (L) 150 - 400 K/uL  Basic metabolic panel     Status: Abnormal   Collection Time: 04/11/15  5:20 AM  Result Value Ref Range   Sodium 134 (L) 135 - 145 mmol/L   Potassium 4.1 3.5 - 5.1 mmol/L   Chloride 104 101 - 111 mmol/L   CO2 21 (L) 22 - 32 mmol/L   Glucose, Bld 111 (H) 65 - 99 mg/dL   BUN 8 6 - 20 mg/dL   Creatinine, Ser 0.77 0.44 - 1.00 mg/dL   Calcium 7.9 (L) 8.9 - 10.3 mg/dL   GFR calc non Af Amer >60 >60 mL/min   GFR calc Af Amer >60 >60 mL/min    Comment: (NOTE) The eGFR has been calculated using the CKD EPI equation. This calculation has not been validated in all clinical situations. eGFR's persistently <60 mL/min signify possible Chronic Kidney Disease.    Anion gap 9 5 - 15      Component Value Date/Time   SDES BLOOD LEFT ARM 04/10/2015 1945   SPECREQUEST BOTTLES DRAWN AEROBIC AND ANAEROBIC 5CC 04/10/2015 1945   CULT NO GROWTH < 12 HOURS 04/10/2015 1945   REPTSTATUS PENDING 04/10/2015 1945   Dg Chest 2 View  04/10/2015  CLINICAL DATA:  Pain upper right breast towards medial border. 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 Chest pain and shortness breath for 2 days. EXAM: CHEST  2 VIEW COMPARISON:  Chest x-ray dated 12/28/2013. FINDINGS: Heart size is upper normal, not significantly changed compared to the previous exam. Overall cardiomediastinal silhouette is stable in size and configuration. Ill-defined opacities are seen within the right mid lung region and at the left lung base. A focal rounded nodular density overlying a mid thoracic vertebral body on the lateral view may be related to the right mid lung opacity. No pleural effusions seen. No pneumothorax. No acute osseous abnormality. IMPRESSION: 1.  Ill-defined opacity within the right mid lung region with an additional smaller ill-defined opacity at the left lung base. This could represent asymmetric  edema or pneumonia. 2. Focal nodular density overlying a mid thoracic vertebral body on the lateral view, measuring 1.3 cm greatest dimension, which may be related to the right mid lung opacity. A neoplastic process cannot be excluded. Recommend chest CT for further characterization. Electronically Signed   By: Franki Cabot M.D.   On: 04/10/2015 18:34   Ct Angio Chest Pe W/cm &/or Wo Cm  04/10/2015  CLINICAL DATA:  Right-sided chest pain for 2 days, shortness of breath, cough, chest pain, IV drug abuse, acute bacterial endocarditis EXAM: CT ANGIOGRAPHY CHEST WITH CONTRAST TECHNIQUE: Multidetector CT imaging of the chest was performed using the standard protocol during bolus administration of intravenous contrast. Multiplanar CT image reconstructions and MIPs were obtained to evaluate the vascular anatomy. CONTRAST:  45m OMNIPAQUE IOHEXOL 350 MG/ML SOLN COMPARISON:  04/10/2015 chest radiograph FINDINGS: Mediastinum/Nodes: There is filling defect in the left lower lobe pulmonary artery at the hilum extending into numerous branches. Branches supplying the lateral left lower lobe appear totally occluded as this vessel extends into the area of consolidation in the anterior lateral left lower lobe described above. Left upper lobe pulmonary arteries are clear. Right pulmonary arteries are clear. There are numerous mediastinal lymph nodes. Sub carinal adenopathy measures 12 mm and right hilar adenopathy measures 12 mm. Lungs/Pleura: Posteriorly in the right lower lobe superior segment, there is a 5.2. x 3.8 x 4.5 cm well-defined rounded pleural-based abnormality that consist of consolidation mixed with areas of air attenuation. The walls of this lesion are mildly irregular and there is mild surrounding inflammatory change. There are multiple irregular right upper  lobe pulmonary nodules, seen on series 5, image number 47, 48, and 59. The largest of these is seen on image 59 and measures 10 mm. There is consolidation in the anterior inferior medial right middle lobe which appears most consistent with atelectasis. In the left upper lobe there is an irregular 7 mm pulmonary nodule on image number 63. There is a 6 mm nodule in the left upper lobe on image number 45. There is consolidation in the anterior lateral aspect of the left lower lobe at the level of the diaphragm. There is a small right pleural effusion. Upper abdomen: The spleen is only partially imaged but appears possibly enlarged. The liver may also be enlarged. Musculoskeletal: No acute or focally suspicious findings. Review of the MIP images confirms the above findings. IMPRESSION: 1. Acute left-sided pulmonary embolism with pulmonary parenchymal infarction. Septic vs bland emboli considered. 2. Large complex masslike structure right lower lobe. Malignancy is not excluded. However, cavitating pneumonia or pulmonary abscess considered more likely, possibly from septic embolic seeding vs aspiration. 3. Multiple bilateral pulmonary nodules. Differential diagnostic possibilities include metastatic disease or septic emboli. 4. Several enlarged mediastinal lymph nodes could be reactive. Metastatic disease not excluded. Critical Value/emergent results were called by telephone at the time of interpretation on 04/10/2015 at 9:17 pm to Dr. CVenora Maples, who verbally acknowledged these results. Electronically Signed   By: RSkipper ClicheM.D.   On: 04/10/2015 21:18   Recent Results (from the past 240 hour(s))  Blood Culture (routine x 2)     Status: None (Preliminary result)   Collection Time: 04/10/15  7:30 PM  Result Value Ref Range Status   Specimen Description BLOOD RIGHT ARM  Final   Special Requests BOTTLES DRAWN AEROBIC AND ANAEROBIC 5CC  Final   Culture NO GROWTH < 12 HOURS  Final   Report Status PENDING   Incomplete  Blood Culture (  routine x 2)     Status: None (Preliminary result)   Collection Time: 04/10/15  7:45 PM  Result Value Ref Range Status   Specimen Description BLOOD LEFT ARM  Final   Special Requests BOTTLES DRAWN AEROBIC AND ANAEROBIC 5CC  Final   Culture NO GROWTH < 12 HOURS  Final   Report Status PENDING  Incomplete      04/11/2015, 10:05 AM     LOS: 1 day    Records and images were personally reviewed where available.

## 2015-04-11 NOTE — Consult Note (Signed)
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 She is admitted 10/29 with fever , chest wall pain, she has been injecting opoid's from crushed tablets.  CT of chest reveals  Left lower lobe filling defect and rt lower lobe cw infection. She is in no acute resp distress. She is on no O2 currently. Her major complaint is pain. RN reports she changes her pain expression any time a white coat comes in the room. She is on heparin drip and being treated with IV dilaudid that is not controlling pain. CT scan may reveal pulmonary infarct  And possible cavitary process. She is being followed by ID and they are managing her abx. Pulmonary asked to comment.  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  has past surgical history that includes Bunionectomy; Tonsillectomy; and Tricuspid valve replacement. Prior to Admission medications   Not on File   No Known Allergies  FAMILY HISTORY:  family history includes CAD in her mother; Hyperlipidemia in her mother; Hypertension in her father; Kidney failure in her father. SOCIAL HISTORY:  reports that she has been smoking Cigarettes.  She has never used smokeless tobacco. She reports that she uses illicit drugs (IV). She reports that she does not drink alcohol.  REVIEW OF SYSTEMS:   10 point review of system taken, please see HPI for positives and negatives.  SUBJECTIVE:   VITAL SIGNS: Temp:  [97.6 F (36.4  C)-101.2 F (38.4 C)] 98.4 F (36.9 C) (10/30 0431) Pulse Rate:  [84-122] 96 (10/30 0431) Resp:  [18-34] 34 (10/30 0431) BP: (102-131)/(64-95) 131/93 mmHg (10/30 0431) SpO2:  [94 %-100 %] 97 % (10/30 0431) Weight:  [160 lb (72.576 kg)-161 lb 1.6 oz (73.074 kg)] 161 lb 1.6 oz (73.074 kg) (10/30 0030)  PHYSICAL EXAMINATION: General:  WNWDWF complaining of chest wall pain Neuro:  Teary, depressed appearing but intact HEENT:  No jvd/LAN, moist oral mucosa Cardiovascular:  HSR RRR Lungs: RLL rhonchi Abdomen:  Soft +bs Musculoskeletal:  intact Skin: tracks bilateral forearms   Recent Labs Lab 04/10/15 1819 04/11/15 0520  NA 131* 134*  K 4.0 4.1  CL 97* 104  CO2 24 21*  BUN 9 8  CREATININE 0.85 0.77  GLUCOSE 141* 111*    Recent Labs Lab 04/10/15 1819 04/11/15 0520  HGB 10.6* 10.0*  HCT 34.2* 32.2*  WBC 15.2* 13.9*  PLT 147* 128*   Dg Chest 2 View  04/10/2015  CLINICAL DATA:  Pain upper right breast towards medial border. 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 Chest pain and shortness breath for 2 days. EXAM: CHEST  2 VIEW COMPARISON:  Chest x-ray dated 12/28/2013. FINDINGS: Heart size is upper normal, not significantly changed compared to the previous exam. Overall cardiomediastinal silhouette is stable in size and configuration. Ill-defined opacities are seen within the right mid lung region and at the left lung base. A focal rounded nodular density overlying a mid thoracic vertebral body on the lateral view may  be related to the right mid lung opacity. No pleural effusions seen. No pneumothorax. No acute osseous abnormality. IMPRESSION: 1. Ill-defined opacity within the right mid lung region with an additional smaller ill-defined opacity at the left lung base. This could represent asymmetric edema or pneumonia. 2. Focal nodular density overlying a mid thoracic vertebral body on the lateral view, measuring 1.3 cm greatest dimension, which may be related to the right mid lung  opacity. A neoplastic process cannot be excluded. Recommend chest CT for further characterization. Electronically Signed   By: Bary RichardStan  Maynard M.D.   On: 04/10/2015 18:34   Ct Angio Chest Pe W/cm &/or Wo Cm  04/10/2015  CLINICAL DATA:  Right-sided chest pain for 2 days, shortness of breath, cough, chest pain, IV drug abuse, acute bacterial endocarditis EXAM: CT ANGIOGRAPHY CHEST WITH CONTRAST TECHNIQUE: Multidetector CT imaging of the chest was performed using the standard protocol during bolus administration of intravenous contrast. Multiplanar CT image reconstructions and MIPs were obtained to evaluate the vascular anatomy. CONTRAST:  80mL OMNIPAQUE IOHEXOL 350 MG/ML SOLN COMPARISON:  04/10/2015 chest radiograph FINDINGS: Mediastinum/Nodes: There is filling defect in the left lower lobe pulmonary artery at the hilum extending into numerous branches. Branches supplying the lateral left lower lobe appear totally occluded as this vessel extends into the area of consolidation in the anterior lateral left lower lobe described above. Left upper lobe pulmonary arteries are clear. Right pulmonary arteries are clear. There are numerous mediastinal lymph nodes. Sub carinal adenopathy measures 12 mm and right hilar adenopathy measures 12 mm. Lungs/Pleura: Posteriorly in the right lower lobe superior segment, there is a 5.2. x 3.8 x 4.5 cm well-defined rounded pleural-based abnormality that consist of consolidation mixed with areas of air attenuation. The walls of this lesion are mildly irregular and there is mild surrounding inflammatory change. There are multiple irregular right upper lobe pulmonary nodules, seen on series 5, image number 47, 48, and 59. The largest of these is seen on image 59 and measures 10 mm. There is consolidation in the anterior inferior medial right middle lobe which appears most consistent with atelectasis. In the left upper lobe there is an irregular 7 mm pulmonary nodule on image number 63.  There is a 6 mm nodule in the left upper lobe on image number 45. There is consolidation in the anterior lateral aspect of the left lower lobe at the level of the diaphragm. There is a small right pleural effusion. Upper abdomen: The spleen is only partially imaged but appears possibly enlarged. The liver may also be enlarged. Musculoskeletal: No acute or focally suspicious findings. Review of the MIP images confirms the above findings. IMPRESSION: 1. Acute left-sided pulmonary embolism with pulmonary parenchymal infarction. Septic vs bland emboli considered. 2. Large complex masslike structure right lower lobe. Malignancy is not excluded. However, cavitating pneumonia or pulmonary abscess considered more likely, possibly from septic embolic seeding vs aspiration. 3. Multiple bilateral pulmonary nodules. Differential diagnostic possibilities include metastatic disease or septic emboli. 4. Several enlarged mediastinal lymph nodes could be reactive. Metastatic disease not excluded. Critical Value/emergent results were called by telephone at the time of interpretation on 04/10/2015 at 9:17 pm to Dr. Patria Maneampos , who verbally acknowledged these results. Electronically Signed   By: Esperanza Heiraymond  Rubner M.D.   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 She is admitted 10/29 with fever , chest wall pain, she has been injecting opoid's from crushed tablets.  CT of chest reveals  Left lower lobe filling defect and rt lower lobe cw infection. She is in no acute resp  distress. She is on no O2 currently. Her major complaint is pain. RN reports she changes her pain expression any time a white coat comes in the room. She is on heparin drip and being treated with IV dilaudid that is not controlling pain. CT scan may reveal pulmonary infarct  And possible cavitary process. She is being followed by ID and they are managing her abx. Pulmonary asked to comment.  PLAN:  Pain relief as able  O2 as needed  Stop injecting drugs  Radiographic follow up  Doubt FOB would be of benefit at this time.  Consider LEDS to confirm no DVT   Abx per ID  Consider SSRI for depression in IV DA   Brett Canales Minor ACNP [facility] P[facility] Pager [phone] till 3 pm If no answer page [phone] 04/11/2015, 12:39 PM  Attending Note:  I have examined patient, reviewed labs, studies and notes. I have discussed the case with S Minor, and I agree with the data and plans as amended above. 39 yo woman, IVDA, admitted with RLL cavitary lesion consistent with PNA, has a less well-circumscribed LLL opacity as well distal to L-sided PE. The PE is large enough to fill LLL pulm artery and segmental branches. This is too large to be from a vegetation, is due to clot. She will need anticoagulation. Clinical picture concerning for bacterial endocarditis (which she has had before) although this hasn't been confirmed yet, plus superimposed PE. No way to establish whether the pulmonary infarcts are infected vs bland infarcts. We will have to assume that these are abscesses and treat as such with both abx and anticoagulation. I suppose if she had a normal TEE you could make an argument that both lung lesions were due to clot alone. Even so, in an infarcted area of the lung there is high likelihood for infection and abscess just from pulmonary flora. She will need prolonged abx regardless with serial CT chest over several months to prove resolution.    Levy Pupa, MD, PhD 04/11/2015, 1:42 PM [facility] [phone] or if no answer [phone]

## 2015-04-11 NOTE — Progress Notes (Signed)
Patient Demographics  Melanie Crane, is a 39 y.o. female, DOB - 04/07/1976, WUJ:[id number]  Admit date - 04/10/2015   Admitting Physician Alberteen Samhristopher P Danford, MD  Outpatient Primary MD for the patient is No PCP Per Patient  LOS - 1   Chief Complaint  Patient presents with  . Chest Pain  . Shortness of Breath       Admission HPI/Brief narrative: 39 year old female with history of IV drug abuse, with diagnosis of tricuspid valve endocarditis and septic joint at [facility] in July 2015, culture growing group B strep, treated with ORITAVANCIN, [place], had tricuspid valve surgery October of last year, records showing she was admitted in April of this year secondary to Pseudomonas endocarditis, treated with cefepime and tobramycin, transitioned to oral levofloxacin on discharge, hospital course was complicated by right upper arm DVT secondary to PICC line, presents 10/29 with complaints of fever, dyspnea and chest pain, workup significant for PE(septic versus bland) and septic pulmonary emboli with cavitating lung lesion, and suspicious for endocarditis.  Subjective:   Melanie Crane today has, No headache, No chest pain, No abdominal pain - No Nausea, No new weakness tingling or numbness, No Cough - SOB.   Assessment & Plan    Principal Problem:   Pulmonary embolism (HCC) Active Problems:   Anemia   Thrombocytopenia, unspecified (HCC)   Cigarette smoker   Hepatitis C   Septic embolism (HCC)   Lung abscess (HCC)   PE (pulmonary embolism)  Pulmonary embolus with pulmonary parenchymal infarction - CTA chest with evidence of Acute left-sided pulmonary embolism with pulmonary parenchymal infarction. Septic vs bland emboli considered. -  continue with heparin drip - Requested pulmonary consult - Follow 2 D echo.  Septic emboli with cavitating lung lesion and suspected endocarditis -  CTA chest with evidence of multiple pulmonary nodules and masslike lesion, this is most likely related to septic emboli and lung abscess. - History of endocarditis GBS and July 2015, and Pseudomonas on April 2016. - ID consult appreciated, continue with IV vancomycin and Zosyn, follow on blood cultures and 2-D echo.  Anemia - Most likely anemia of chronic disease, monitor CBC.  IV drug abuse - Patient was counseled   Hepatitis C - Workup as per ID  Unclear history of lupus anticoagulant - Follow with Hematology As an Outpatient    Code Status: Full  Family Communication: None at bedside  Disposition Plan: Pending further work up   Procedures  None   Consults   ID PCCM   Medications  Scheduled Meds: . cefTRIAXone (ROCEPHIN)  IV  2 g Intravenous Q24H  . [START ON 04/12/2015] Influenza vac split quadrivalent PF  0.5 mL Intramuscular Tomorrow-1000  . sodium chloride  3 mL Intravenous Q12H  . vancomycin  750 mg Intravenous Q8H   Continuous Infusions: . sodium chloride 75 mL/hr at 04/11/15 0045  . heparin 1,350 Units/hr (04/11/15 1045)   PRN Meds:.HYDROmorphone (DILAUDID) injection, oxyCODONE  DVT Prophylaxis   Heparin GTT  Lab Results  Component Value Date   PLT 128* 04/11/2015    Antibiotics  Anti-infectives    Start     Dose/Rate Route Frequency Ordered Stop   04/11/15 1030  cefTRIAXone (ROCEPHIN) 2 g in dextrose 5 %  50 mL IVPB     2 g 100 mL/hr over 30 Minutes Intravenous Every 24 hours 04/11/15 1023     04/11/15 0400  piperacillin-tazobactam (ZOSYN) IVPB 3.375 g  Status:  Discontinued     3.375 g 12.5 mL/hr over 240 Minutes Intravenous Every 8 hours 04/10/15 1925 04/11/15 1023   04/11/15 0400  vancomycin (VANCOCIN) IVPB 750 mg/150 ml premix     750 mg 150 mL/hr over 60 Minutes Intravenous Every 8 hours 04/10/15 1925     04/10/15 1930  vancomycin (VANCOCIN) 1,500 mg in sodium chloride 0.9 % 500 mL IVPB     1,500 mg 250 mL/hr over 120 Minutes  Intravenous  Once 04/10/15 1925 04/10/15 2235   04/10/15 1915  piperacillin-tazobactam (ZOSYN) IVPB 3.375 g     3.375 g 100 mL/hr over 30 Minutes Intravenous  Once 04/10/15 1910 04/10/15 2107   04/10/15 1915  vancomycin (VANCOCIN) IVPB 1000 mg/200 mL premix  Status:  Discontinued     1,000 mg 200 mL/hr over 60 Minutes Intravenous  Once 04/10/15 1910 04/10/15 1925          Objective:   Filed Vitals:   04/11/15 0010 04/11/15 0030 04/11/15 0418 04/11/15 0431  BP:  117/88 130/94 131/93  Pulse:  86 97 96  Temp: 98.5 F (36.9 C) 98.2 F (36.8 C) 97.6 F (36.4 C) 98.4 F (36.9 C)  TempSrc: Oral Oral Oral Oral  Resp:  21 30 34  Height:   (1.626 m)    Weight:  73.074 kg (161 lb 1.6 oz)    SpO2:  99% 98% 97%    Wt Readings from Last 3 Encounters:  04/11/15 73.074 kg (161 lb 1.6 oz)  12/28/13 70.2 kg (154 lb 12.2 oz)     Intake/Output Summary (Last 24 hours) at 04/11/15 1139 Last data filed at 04/10/15 2235  Gross per 24 hour  Intake   3050 ml  Output      0 ml  Net   3050 ml     Physical Exam  Awake Alert, Oriented X 3,  [place].AT,PERRAL Supple Neck,No JVD, Symmetrical Chest wall movement, diminished air movement bilaterally, tachypneic, in mild respiratory distress No Gallops,Rubs or new Murmurs, No Parasternal Heave +ve B.Sounds, Abd Soft, No tenderness, No organomegaly appriciated, No rebound - guarding or rigidity. No Cyanosis, Clubbing or edema, No new Rash or bruise     Data Review   Micro Results Recent Results (from the past 240 hour(s))  Blood Culture (routine x 2)     Status: None (Preliminary result)   Collection Time: 04/10/15  7:30 PM  Result Value Ref Range Status   Specimen Description BLOOD RIGHT ARM  Final   Special Requests BOTTLES DRAWN AEROBIC AND ANAEROBIC 5CC  Final   Culture NO GROWTH < 12 HOURS  Final   Report Status PENDING  Incomplete  Blood Culture (routine x 2)     Status: None (Preliminary result)   Collection Time: 04/10/15   7:45 PM  Result Value Ref Range Status   Specimen Description BLOOD LEFT ARM  Final   Special Requests BOTTLES DRAWN AEROBIC AND ANAEROBIC 5CC  Final   Culture NO GROWTH < 12 HOURS  Final   Report Status PENDING  Incomplete    Radiology Reports Dg Chest 2 View  04/10/2015  CLINICAL DATA:  Pain upper right breast towards medial border. 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 Chest pain and shortness breath for 2 days. EXAM: CHEST  2 VIEW COMPARISON:  Chest x-ray dated 12/28/2013. FINDINGS: Heart size is upper normal, not significantly changed compared to the previous exam. Overall cardiomediastinal silhouette is stable in size and configuration. Ill-defined opacities are seen within the right mid lung region and at the left lung base. A focal rounded nodular density overlying a mid thoracic vertebral body on the lateral view may be related to the right mid lung opacity. No pleural effusions seen. No pneumothorax. No acute osseous abnormality. IMPRESSION: 1. Ill-defined opacity within the right mid lung region with an additional smaller ill-defined opacity at the left lung base. This could represent asymmetric edema or pneumonia. 2. Focal nodular density overlying a mid thoracic vertebral body on the lateral view, measuring 1.3 cm greatest dimension, which may be related to the right mid lung opacity. A neoplastic process cannot be excluded. Recommend chest CT for further characterization. Electronically Signed   By: Bary Richard M.D.   On: 04/10/2015 18:34   Ct Angio Chest Pe W/cm &/or Wo Cm  04/10/2015  CLINICAL DATA:  Right-sided chest pain for 2 days, shortness of breath, cough, chest pain, IV drug abuse, acute bacterial endocarditis EXAM: CT ANGIOGRAPHY CHEST WITH CONTRAST TECHNIQUE: Multidetector CT imaging of the chest was performed using the standard protocol during bolus administration of intravenous contrast. Multiplanar CT image reconstructions and MIPs were obtained to evaluate the vascular  anatomy. CONTRAST:  80mL OMNIPAQUE IOHEXOL 350 MG/ML SOLN COMPARISON:  04/10/2015 chest radiograph FINDINGS: Mediastinum/Nodes: There is filling defect in the left lower lobe pulmonary artery at the hilum extending into numerous branches. Branches supplying the lateral left lower lobe appear totally occluded as this vessel extends into the area of consolidation in the anterior lateral left lower lobe described above. Left upper lobe pulmonary arteries are clear. Right pulmonary arteries are clear. There are numerous mediastinal lymph nodes. Sub carinal adenopathy measures 12 mm and right hilar adenopathy measures 12 mm. Lungs/Pleura: Posteriorly in the right lower lobe superior segment, there is a 5.2. x 3.8 x 4.5 cm well-defined rounded pleural-based abnormality that consist of consolidation mixed with areas of air attenuation. The walls of this lesion are mildly irregular and there is mild surrounding inflammatory change. There are multiple irregular right upper lobe pulmonary nodules, seen on series 5, image number 47, 48, and 59. The largest of these is seen on image 59 and measures 10 mm. There is consolidation in the anterior inferior medial right middle lobe which appears most consistent with atelectasis. In the left upper lobe there is an irregular 7 mm pulmonary nodule on image number 63. There is a 6 mm nodule in the left upper lobe on image number 45. There is consolidation in the anterior lateral aspect of the left lower lobe at the level of the diaphragm. There is a small right pleural effusion. Upper abdomen: The spleen is only partially imaged but appears possibly enlarged. The liver may also be enlarged. Musculoskeletal: No acute or focally suspicious findings. Review of the MIP images confirms the above findings. IMPRESSION: 1. Acute left-sided pulmonary embolism with pulmonary parenchymal infarction. Septic vs bland emboli considered. 2. Large complex masslike structure right lower lobe. Malignancy  is not excluded. However, cavitating pneumonia or pulmonary abscess considered more likely, possibly from septic embolic seeding vs aspiration. 3. Multiple bilateral pulmonary nodules. Differential diagnostic possibilities include metastatic disease or septic emboli. 4. Several enlarged mediastinal lymph nodes could be reactive. Metastatic disease not excluded. Critical Value/emergent results were called by telephone at the time of interpretation on 04/10/2015 at 9:17  pm to Dr. Patria Mane , who verbally acknowledged these results. Electronically Signed   By: Esperanza Heir M.D.   On: 04/10/2015 21:18     CBC  Recent Labs Lab 04/10/15 1819 04/10/15 1935 04/11/15 0520  WBC 15.2*  --  13.9*  HGB 10.6*  --  10.0*  HCT 34.2*  --  32.2*  PLT 147*  --  128*  MCV 75.3*  --  76.3*  MCH 23.3*  --  23.7*  MCHC 31.0  --  31.1  RDW 18.2*  --  18.5*  LYMPHSABS  --  1.9  --   MONOABS  --  1.4*  --   EOSABS  --  0.0  --   BASOSABS  --  0.0  --     Chemistries   Recent Labs Lab 04/10/15 1819 04/10/15 1935 04/11/15 0520  NA 131*  --  134*  K 4.0  --  4.1  CL 97*  --  104  CO2 24  --  21*  GLUCOSE 141*  --  111*  BUN 9  --  8  CREATININE 0.85  --  0.77  CALCIUM 8.4*  --  7.9*  AST  --  33  --   ALT  --  39  --   ALKPHOS  --  99  --   BILITOT  --  1.2  --    ------------------------------------------------------------------------------------------------------------------ estimated creatinine clearance is 92.6 mL/min (by C-G formula based on Cr of 0.77). ------------------------------------------------------------------------------------------------------------------ No results for input(s): HGBA1C in the last 72 hours. ------------------------------------------------------------------------------------------------------------------ No results for input(s): CHOL, HDL, LDLCALC, TRIG, CHOLHDL, LDLDIRECT in the last 72  hours. ------------------------------------------------------------------------------------------------------------------ No results for input(s): TSH, T4TOTAL, T3FREE, THYROIDAB in the last 72 hours.  Invalid input(s): FREET3 ------------------------------------------------------------------------------------------------------------------  Recent Labs  04/11/15 0142  RETICCTPCT 1.2    Coagulation profile No results for input(s): INR, PROTIME in the last 168 hours.  No results for input(s): DDIMER in the last 72 hours.  Cardiac Enzymes No results for input(s): CKMB, TROPONINI, MYOGLOBIN in the last 168 hours.  Invalid input(s): CK ------------------------------------------------------------------------------------------------------------------ Invalid input(s): POCBNP     Time Spent in minutes   40 minutes   Caterra Ostroff M.D on 04/11/2015 at 11:39 AM  Between 7am to 7pm - Pager - [phone]  After 7pm go to [email or web address] - password [facility]  [facility]   Office  [phone]

## 2015-04-11 NOTE — Progress Notes (Signed)
*  PRELIMINARY RESULTS* Echocardiogram 2D Echocardiogram has been performed.  Melanie Crane, Melanie Crane 04/11/2015, 1:56 PM

## 2015-04-11 NOTE — Progress Notes (Signed)
ANTICOAGULATION CONSULT NOTE - Follow up Pharmacy Consult for heparin Indication: pulmonary embolus  No Known Allergies  Patient Measurements: Height: 5\' 4"  (162.6 cm) Weight: 161 lb 1.6 oz (73.074 kg) IBW/kg (Calculated) : 54.7 Heparin Dosing Weight: 70kg  Vital Signs: Temp: 98.4 F (36.9 C) (10/30 0431) Temp Source: Oral (10/30 0431) BP: 131/93 mmHg (10/30 0431) Pulse Rate: 96 (10/30 0431)  Labs:  Recent Labs  04/10/15 1819 04/11/15 0520  HGB 10.6* 10.0*  HCT 34.2* 32.2*  PLT 147* 128*  HEPARINUNFRC  --  <0.10*  CREATININE 0.85 0.77    Estimated Creatinine Clearance: 92.6 mL/min (by C-G formula based on Cr of 0.77).   Medical History: Past Medical History  Diagnosis Date  . Anemia   . IVDU (intravenous drug user)   . 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 Hepatitis C   . Miscarriage     Five times  Assessment: 39 y.o. female with a PMHx of R sided bacterial endocarditis/tricuspid vegetations, continued IVDU (injects Opana), and anemia, who presents to the ED with complaints of gradual onset chest pain and shortness of breath 2 days. CTA showed left sided PE and multiple pulmonary nodules which could mean mets or septic emboli.  Heparin infusion started last night. Initial 6 hr heparin level is <0.1 on 1100 units/hr. RN reports no interruptions or complications with heparin line, infusing at 1100 unit/hr in peripheral IV site, no bleeding noted.  pltc low 128K, hgb low/stable at 10.0.   Goal of Therapy:  Heparin level 0.3-0.7 units/ml Monitor platelets by anticoagulation protocol: Yes   Plan:  Give 4000 unit bolus heparin  Increase heparin drip rate to 1350 units/hr 6 hour heparin level Daily HL, CBC   Noah Delaineuth Klynn Linnemann, RPh Clinical Pharmacist Pager: [phone]  04/11/2015 10:26 AM

## 2015-04-11 NOTE — Progress Notes (Signed)
Utilization review completed.  

## 2015-04-12 ENCOUNTER — Inpatient Hospital Stay (HOSPITAL_COMMUNITY): Payer: Self-pay

## 2015-04-12 DIAGNOSIS — R000 Tachycardia, unspecified: Secondary | ICD-10-CM

## 2015-04-12 DIAGNOSIS — R52 Pain, unspecified: Secondary | ICD-10-CM

## 2015-04-12 DIAGNOSIS — I38 Endocarditis, valve unspecified: Secondary | ICD-10-CM

## 2015-04-12 DIAGNOSIS — I361 Nonrheumatic tricuspid (valve) insufficiency: Secondary | ICD-10-CM

## 2015-04-12 DIAGNOSIS — T826XXA Infection and inflammatory reaction due to cardiac valve prosthesis, initial encounter: Secondary | ICD-10-CM | POA: Diagnosis present

## 2015-04-12 DIAGNOSIS — I39 Endocarditis and heart valve disorders in diseases classified elsewhere: Secondary | ICD-10-CM

## 2015-04-12 DIAGNOSIS — R0682 Tachypnea, not elsewhere classified: Secondary | ICD-10-CM

## 2015-04-12 DIAGNOSIS — A419 Sepsis, unspecified organism: Secondary | ICD-10-CM | POA: Diagnosis present

## 2015-04-12 DIAGNOSIS — M549 Dorsalgia, unspecified: Secondary | ICD-10-CM

## 2015-04-12 DIAGNOSIS — I2601 Septic pulmonary embolism with acute cor pulmonale: Secondary | ICD-10-CM

## 2015-04-12 DIAGNOSIS — I2690 Septic pulmonary embolism without acute cor pulmonale: Secondary | ICD-10-CM | POA: Diagnosis present

## 2015-04-12 DIAGNOSIS — Z952 Presence of prosthetic heart valve: Secondary | ICD-10-CM

## 2015-04-12 DIAGNOSIS — J852 Abscess of lung without pneumonia: Secondary | ICD-10-CM

## 2015-04-12 DIAGNOSIS — R079 Chest pain, unspecified: Secondary | ICD-10-CM

## 2015-04-12 DIAGNOSIS — B9689 Other specified bacterial agents as the cause of diseases classified elsewhere: Secondary | ICD-10-CM

## 2015-04-12 DIAGNOSIS — I339 Acute and subacute endocarditis, unspecified: Secondary | ICD-10-CM

## 2015-04-12 DIAGNOSIS — F1910 Other psychoactive substance abuse, uncomplicated: Secondary | ICD-10-CM

## 2015-04-12 DIAGNOSIS — B1920 Unspecified viral hepatitis C without hepatic coma: Secondary | ICD-10-CM

## 2015-04-12 LAB — HAPTOGLOBIN: HAPTOGLOBIN: 227 mg/dL — AB (ref 34–200)

## 2015-04-12 LAB — CBC
HEMATOCRIT: 30.4 % — AB (ref 36.0–46.0)
Hemoglobin: 9.5 g/dL — ABNORMAL LOW (ref 12.0–15.0)
MCH: 23.6 pg — ABNORMAL LOW (ref 26.0–34.0)
MCHC: 31.3 g/dL (ref 30.0–36.0)
MCV: 75.6 fL — ABNORMAL LOW (ref 78.0–100.0)
PLATELETS: 159 10*3/uL (ref 150–400)
RBC: 4.02 MIL/uL (ref 3.87–5.11)
RDW: 18.2 % — AB (ref 11.5–15.5)
WBC: 12.9 10*3/uL — AB (ref 4.0–10.5)

## 2015-04-12 LAB — HEPARIN LEVEL (UNFRACTIONATED)
HEPARIN UNFRACTIONATED: 0.1 [IU]/mL — AB (ref 0.30–0.70)
HEPARIN UNFRACTIONATED: 0.12 [IU]/mL — AB (ref 0.30–0.70)
Heparin Unfractionated: <0.1 IU/mL — ABNORMAL LOW (ref 0.30–0.70)

## 2015-04-12 LAB — URINE CULTURE: CULTURE: NO GROWTH

## 2015-04-12 LAB — BASIC METABOLIC PANEL
ANION GAP: 9 (ref 5–15)
BUN: 9 mg/dL (ref 6–20)
CALCIUM: 8.1 mg/dL — AB (ref 8.9–10.3)
CO2: 21 mmol/L — AB (ref 22–32)
CREATININE: 0.71 mg/dL (ref 0.44–1.00)
Chloride: 107 mmol/L (ref 101–111)
Glucose, Bld: 114 mg/dL — ABNORMAL HIGH (ref 65–99)
Potassium: 3.7 mmol/L (ref 3.5–5.1)
SODIUM: 137 mmol/L (ref 135–145)

## 2015-04-12 LAB — VANCOMYCIN, TROUGH: Vancomycin Tr: 8 ug/mL — ABNORMAL LOW (ref 10.0–20.0)

## 2015-04-12 MED ORDER — VANCOMYCIN HCL 10 G IV SOLR
1250.0000 mg | Freq: Three times a day (TID) | INTRAVENOUS | Status: DC
  Administered 2015-04-13 – 2015-04-16 (×10): 1250 mg via INTRAVENOUS
  Filled 2015-04-12 (×13): qty 1250

## 2015-04-12 MED ORDER — HEPARIN BOLUS VIA INFUSION
4000.0000 [IU] | Freq: Once | INTRAVENOUS | Status: AC
  Administered 2015-04-12: 4000 [IU] via INTRAVENOUS
  Filled 2015-04-12: qty 4000

## 2015-04-12 MED ORDER — HEPARIN BOLUS VIA INFUSION
5000.0000 [IU] | Freq: Once | INTRAVENOUS | Status: AC
  Administered 2015-04-12: 5000 [IU] via INTRAVENOUS
  Filled 2015-04-12: qty 5000

## 2015-04-12 NOTE — Consult Note (Signed)
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 She is admitted 10/29 with fever , chest wall pain, she has been injecting opoid's from crushed tablets.  CT of chest reveals  Left lower lobe filling defect and rt lower lobe cw infection. She is in no acute resp distress. She is on no O2 currently. Her major complaint is pain. RN reports she changes her pain expression any time a white coat comes in the room. She is on heparin drip and being treated with IV dilaudid that is not controlling pain. CT scan may reveal pulmonary infarct  And possible cavitary process. She is being followed by ID and they are managing her abx. Pulmonary asked to comment.    SUBJECTIVE:   04/12/2015 : denies complaints. Agrees that repeat endocarditis is a possibility. Does not say if she is abetter since admit. 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   VITAL SIGNS: Temp:  [97.9 F (36.6 C)-100.6 F (38.1 C)] 97.9 F (36.6 C) (10/31 0819) Pulse Rate:  [81-102] 81 (10/31 0819) Resp:  [18-32] 18 (10/31 0420) BP: (109-123)/(80-89) 109/80 mmHg (10/31 0819) SpO2:  [94 %-98 %] 98 % (10/31 0819)  PHYSICAL EXAMINATION: General:  WNWDWF complaining of chest wall pain Neuro:  Teary, depressed appearing but intact HEENT:  No jvd/LAN, moist oral mucosa Cardiovascular:  HSR RRR Lungs: RLL rhonchi Abdomen:  Soft +bs Musculoskeletal:  intact Skin: tracks bilateral forearms    PULMONARY No results for input(s): PHART, PCO2ART, PO2ART, HCO3, TCO2, O2SAT in the last 168 hours.  Invalid  input(s): PCO2, PO2  CBC  Recent Labs Lab 04/10/15 1819 04/11/15 0520 04/12/15 0140  HGB 10.6* 10.0* 9.5*  HCT 34.2* 32.2* 30.4*  WBC 15.2* 13.9* 12.9*  PLT 147* 128* 159    COAGULATION No results for input(s): INR in the last 168 hours.  CARDIAC  No results for input(s): TROPONINI in the last 168 hours. No results for input(s): PROBNP in the last 168 hours.   CHEMISTRY  Recent Labs Lab 04/10/15 1819 04/11/15 0520 04/12/15 0140  NA 131* 134* 137  K 4.0 4.1 3.7  CL 97* 104 107  CO2 24 21* 21*  GLUCOSE 141* 111* 114*  BUN 9 8 9   CREATININE 0.85 0.77 0.71  CALCIUM 8.4* 7.9* 8.1*   Estimated Creatinine Clearance: 92.6 mL/min (by C-G formula based on Cr of 0.71).   LIVER  Recent Labs Lab 04/10/15 1935  AST 33  ALT 39  ALKPHOS 99  BILITOT 1.2  PROT 7.1  ALBUMIN 2.7*     INFECTIOUS  Recent Labs Lab 04/10/15 1958 04/10/15 2215  LATICACIDVEN 1.29 0.68     ENDOCRINE CBG (last 3)  No results for input(s): GLUCAP in the last 72 hours.       IMAGING x48h  - image(s) personally visualized  -   highlighted in bold Dg Chest 2 View  04/10/2015  CLINICAL DATA:  Pain upper right breast towards medial border. 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 Chest pain and shortness breath for 2 days. EXAM: CHEST  2 VIEW COMPARISON:  Chest x-ray dated 12/28/2013. FINDINGS: Heart size is upper normal, not significantly changed compared to the previous exam. Overall cardiomediastinal silhouette is stable in size and configuration. Ill-defined opacities are seen within the right mid lung region and at the left lung base. A focal rounded nodular density overlying a mid thoracic vertebral body on the lateral view may be related to the right mid lung opacity. No pleural effusions seen. No pneumothorax. No acute osseous abnormality. IMPRESSION: 1. Ill-defined opacity within the right mid lung region with an additional smaller ill-defined opacity at the left lung base. This could  represent asymmetric edema or pneumonia. 2. Focal nodular density overlying a mid thoracic vertebral body on the lateral view, measuring 1.3 cm greatest dimension, which may be related to the right mid lung opacity. A neoplastic process cannot be excluded. Recommend chest CT for further characterization. Electronically Signed   By: Bary Richard M.D.   On: 04/10/2015 18:34   Ct Angio Chest Pe W/cm &/or Wo Cm  04/10/2015  CLINICAL DATA:  Right-sided chest pain for 2 days, shortness of breath, cough, chest pain, IV drug abuse, acute bacterial endocarditis EXAM: CT ANGIOGRAPHY CHEST WITH CONTRAST TECHNIQUE: Multidetector CT imaging of the chest was performed using the standard protocol during bolus administration of intravenous contrast. Multiplanar CT image reconstructions and MIPs were obtained to evaluate the vascular anatomy. CONTRAST:  80mL OMNIPAQUE IOHEXOL 350 MG/ML SOLN COMPARISON:  04/10/2015 chest radiograph FINDINGS: Mediastinum/Nodes: There is filling defect in the left lower lobe pulmonary artery at the hilum extending into numerous branches. Branches supplying the lateral left lower lobe appear totally occluded as this vessel extends into the area of consolidation in the anterior lateral left lower lobe described above. Left upper lobe pulmonary arteries are clear. Right pulmonary arteries are clear. There are numerous mediastinal lymph nodes. Sub carinal adenopathy measures 12 mm and right hilar adenopathy measures 12 mm. Lungs/Pleura: Posteriorly in the right lower lobe superior segment, there is a 5.2. x 3.8 x 4.5 cm well-defined rounded pleural-based abnormality that consist of consolidation mixed with areas of air attenuation. The walls of this lesion are mildly irregular and there is mild surrounding inflammatory change. There are multiple irregular right upper lobe pulmonary nodules, seen on series 5, image number 47, 48, and 59. The largest of these is seen on image 59 and measures 10 mm.  There is consolidation in the anterior inferior medial right middle lobe which appears most consistent with atelectasis. In the left upper lobe there is an irregular 7 mm pulmonary nodule on image number 63. There is a 6 mm nodule in the left upper lobe on image number 45. There is consolidation in the anterior lateral aspect of the left lower lobe at the level of the diaphragm. There is a small right pleural effusion. Upper abdomen: The spleen is only partially imaged but appears possibly enlarged. The liver may also be enlarged. Musculoskeletal: No acute or focally suspicious findings. Review of the MIP images confirms the above findings. IMPRESSION: 1. Acute left-sided pulmonary embolism with pulmonary parenchymal infarction. Septic vs bland emboli considered. 2. Large complex masslike structure right lower lobe. Malignancy is not excluded. However, cavitating pneumonia or pulmonary abscess considered more likely, possibly from septic embolic seeding vs aspiration. 3. Multiple bilateral pulmonary nodules. Differential diagnostic possibilities include metastatic disease or septic emboli. 4. Several enlarged mediastinal lymph nodes could be reactive. Metastatic disease not excluded. Critical Value/emergent results were called by telephone at the time of interpretation on 04/10/2015 at 9:17  pm to Dr. Patria Mane , who verbally acknowledged these results. Electronically Signed   By: Esperanza Heir M.D.   On: 04/10/2015 21:18    A)  IVDA patient with known prosthetic tricuspid valve Now with   - Septic embolii of endocarditis with RLL mass with cavity lung and other pulmonary nodules  - Possible Pulmonary Embolii in Pulm Artery withouer description of RV strain on echo; impossible to differentiate true PE v septic embolii  PLAN: No role for bronch Anticoagulation IV heparin and then coumadin for PE - get Duplex LE - if positive will help affirm true PE. But if negative, cannot rule out  true PE Antibioptics  per ID Rec CVTS consult - d/ w [facility] Dr Irwin Brakeman  PCCM will sign off  Dr. Kalman Shan, M.D., [facility].C.P Pulmonary and Critical Care Medicine Staff Physician [facility] Pager: [phone], If no answer or between  15:00h - 7:00h: call [phone]  04/12/2015 9:36 AM

## 2015-04-12 NOTE — Progress Notes (Signed)
ANTICOAGULATION CONSULT NOTE  Pharmacy Consult for heparin Indication: pulmonary embolus  No Known Allergies  Patient Measurements: Height: 5\' 4"  (162.6 cm) Weight: 161 lb 1.6 oz (73.074 kg) IBW/kg (Calculated) : 54.7 Heparin Dosing Weight: 70kg  Vital Signs: Temp: 100 F (37.8 C) (10/31 1332) Temp Source: Oral (10/31 1332) BP: 124/82 mmHg (10/31 1332) Pulse Rate: 96 (10/31 1332)  Labs:  Recent Labs  04/10/15 1819 04/11/15 0520  04/12/15 0140 04/12/15 1009 04/12/15 1813  HGB 10.6* 10.0*  --  9.5*  --   --   HCT 34.2* 32.2*  --  30.4*  --   --   PLT 147* 128*  --  159  --   --   HEPARINUNFRC  --  <0.10*  < > <0.10* 0.10* 0.12*  CREATININE 0.85 0.77  --  0.71  --   --   < > = values in this interval not displayed.  Estimated Creatinine Clearance: 92.6 mL/min (by C-G formula based on Cr of 0.71).   Medical History: Past Medical History  Diagnosis Date  . Anemia   . IVDU (intravenous drug user)   . 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 Hepatitis C   . Miscarriage     Five times   Assessment: This is a 39 y.o. female with a PMHx of R sided bacterial endocarditis/tricuspid vegetations, continued IVDU (injects Opana), and anemia, who presented to the ED with complaints of gradual onset chest pain and shortness of breath 2 days. CTA showed possible left sided PE or septic emboli. ID leans in favor of septic emboli.   Heparin level remains barely detectable at 0.12. Nurse reports no issues with infusion or bleeding. Will increase heparin more aggressively with goal of finally reaching therapeutic level.   Goal of Therapy:  Heparin level 0.3-0.7 units/ml Monitor platelets by anticoagulation protocol: Yes   Plan:  Bolus heparin 5000 units and increase heparin drip rate to 2450 units/hr 6 hour heparin level Daily HL, CBC  Arlean Hoppingorey M. Newman PiesBall, PharmD Clinical Pharmacist Pager [phone]  04/12/2015

## 2015-04-12 NOTE — Progress Notes (Signed)
*  PRELIMINARY RESULTS* Vascular Ultrasound Lower extremity venous duplex has been completed.  Preliminary findings: No evidence of DVT or baker's cyst.   Farrel DemarkJill Eunice, RDMS, RVT  04/12/2015, 5:13 PM

## 2015-04-12 NOTE — Progress Notes (Signed)
ANTICOAGULATION CONSULT NOTE  Pharmacy Consult for heparin Indication: pulmonary embolus  No Known Allergies  Patient Measurements: Height: 5\' 4"  (162.6 cm) Weight: 161 lb 1.6 oz (73.074 kg) IBW/kg (Calculated) : 54.7 Heparin Dosing Weight: 70kg  Vital Signs: Temp: 100.6 F (38.1 C) (10/30 2123) Temp Source: Oral (10/30 2123) BP: 122/82 mmHg (10/30 2123) Pulse Rate: 102 (10/30 2123)  Labs:  Recent Labs  04/10/15 1819 04/11/15 0520 04/11/15 1736 04/12/15 0140  HGB 10.6* 10.0*  --  9.5*  HCT 34.2* 32.2*  --  30.4*  PLT 147* 128*  --  159  HEPARINUNFRC  --  <0.10* <0.10* <0.10*  CREATININE 0.85 0.77  --  0.71    Estimated Creatinine Clearance: 92.6 mL/min (by C-G formula based on Cr of 0.71).   Medical History: Past Medical History  Diagnosis Date  . Anemia   . IVDU (intravenous drug user)   . 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 Hepatitis C   . Miscarriage     Five times  Assessment:  This is a 39 y.o. female with a PMHx of R sided bacterial endocarditis/tricuspid vegetations, continued IVDU (injects Opana), and anemia, who presented to the ED with complaints of gradual onset chest pain and shortness of breath 2 days. CTA showed left sided PE and multiple pulmonary nodules which could mean mets or septic emboli.   2nd shift: Heparin level is <0.1, remains subtherapeutic despite heparin re-bolus and increased rate.  RN reports that heparin IV site looks okay but IV pump has alarmed a lot from patient bending arm. Patient has had numerous visitiors in room today.  RN will change Heparin IV site from right arm to left arm.  I discussed this with Dr. Randol KernElgergawy, that other option would be to change therapy to bivalirudin (angiomax) in case patient has anti-thrombin 3 deficiency.  Dr. Randol KernElgergawy wants to continue the IV heparin for now, changing to new IV site.   3rd shift: HL still undetectable, which is unusual at this rate  in this patient's size, no issues with infusion per RN. Would consider changing to Lovenox if the next level is still undetectable.  Goal of Therapy:  Heparin level 0.3-0.7 units/ml Monitor platelets by anticoagulation protocol: Yes   Plan:  Heparin bolus 4000 units Increase heparin drip rate to 1750 units/hr 6 hour heparin level Daily HL, CBC   Damilola Flamm, Darl Householderlison M  04/12/2015

## 2015-04-12 NOTE — Progress Notes (Signed)
Patient Demographics  Melanie Crane, is a 39 y.o. female, DOB - [birth year], AVW:[id number]  Admit date - 04/10/2015   Admitting Physician Alberteen Sam, MD  Outpatient Primary MD for the patient is No PCP Per Patient  LOS - 2   Chief Complaint  Patient presents with  . Chest Pain  . 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   Assessment & Plan    Principal Problem:   Pulmonary embolism (HCC) Active Problems:   IV drug abuse   Anemia   Thrombocytopenia, unspecified (HCC)   Cigarette smoker   Hepatitis C   Septic embolism (HCC)   Lung abscess (HCC)   PE (pulmonary embolism)   Embolism, pulmonary with infarction (HCC)   Endocarditis   Abscess of right lung with pneumonia (HCC)  Pulmonary embolus with pulmonary parenchymal infarction - CTA chest with evidence of Acute left-sided pulmonary embolism with pulmonary parenchymal infarction. Septic vs bland emboli  considered. - Pulmonary consult appreciated, will continue patient on anticoagulation. - Follow on bilateral lower extremity venous Doppler   Septic emboli with cavitating lung lesion /endocarditis - CTA chest with evidence of multiple pulmonary nodules and masslike lesion, this is most likely related to septic emboli and lung abscess. - History of endocarditis GBS on  July 2015, and Pseudomonas on April 2016. - ID consult appreciated, initially on IV vancomycin and Zosyn, transitioned to IV vancomycin and Rocephin on 10/30.  - Blood cultures no growth to date - 2-D echo with mobile vegetation on tricuspid valve(patient status post tricuspid replacement), CT surgery consulted  Anemia - Most likely anemia of chronic disease, monitor CBC.  IV drug abuse - Patient was counseled - HIV nonreactive  Hepatitis C - Workup as per ID, Check Hep C VL, genotype.  Unclear history of lupus anticoagulant - Follow with Hematology As an Outpatient    Code Status: Full  Family Communication: None at bedside  Disposition Plan: Pending further work up   Procedures  None   Consults   ID PCCM CT surgery  Medications  Scheduled Meds: . cefTRIAXone (ROCEPHIN)  IV  2 g Intravenous Q24H  . Influenza vac split quadrivalent PF  0.5 mL Intramuscular Tomorrow-1000  . sodium chloride  3 mL Intravenous Q12H  . vancomycin  750 mg Intravenous Q8H   Continuous Infusions: . sodium chloride 75 mL/hr at 04/12/15 0815  .  heparin 1,750 Units/hr (04/12/15 0454)   PRN Meds:.HYDROmorphone (DILAUDID) injection, oxyCODONE  DVT Prophylaxis   Heparin GTT  Lab Results  Component Value Date   PLT 159 04/12/2015    Antibiotics  Anti-infectives    Start     Dose/Rate Route Frequency Ordered Stop   04/11/15 1030  cefTRIAXone (ROCEPHIN) 2 g in dextrose 5 % 50 mL IVPB     2 g 100 mL/hr over 30 Minutes Intravenous Every 24 hours 04/11/15 1023     04/11/15 0400  piperacillin-tazobactam (ZOSYN) IVPB  3.375 g  Status:  Discontinued     3.375 g 12.5 mL/hr over 240 Minutes Intravenous Every 8 hours 04/10/15 1925 04/11/15 1023   04/11/15 0400  vancomycin (VANCOCIN) IVPB 750 mg/150 ml premix     750 mg 150 mL/hr over 60 Minutes Intravenous Every 8 hours 04/10/15 1925     04/10/15 1930  vancomycin (VANCOCIN) 1,500 mg in sodium chloride 0.9 % 500 mL IVPB     1,500 mg 250 mL/hr over 120 Minutes Intravenous  Once 04/10/15 1925 04/10/15 2235   04/10/15 1915  piperacillin-tazobactam (ZOSYN) IVPB 3.375 g     3.375 g 100 mL/hr over 30 Minutes Intravenous  Once 04/10/15 1910 04/10/15 2107   04/10/15 1915  vancomycin (VANCOCIN) IVPB 1000 mg/200 mL premix  Status:  Discontinued     1,000 mg 200 mL/hr over 60 Minutes Intravenous  Once 04/10/15 1910 04/10/15 1925          Objective:   Filed Vitals:   04/11/15 1300 04/11/15 2123 04/12/15 0420 04/12/15 0819  BP: 123/89 122/82 119/86 109/80  Pulse: 95 102 90 81  Temp: 98.9 F (37.2 C) 100.6 F (38.1 C) 99.1 F (37.3 C) 97.9 F (36.6 C)  TempSrc: Oral Oral Oral Oral  Resp: 32 22 18   Height:      Weight:      SpO2: 97% 94% 96% 98%    Wt Readings from Last 3 Encounters:  04/11/15 73.074 kg (161 lb 1.6 oz)  12/28/13 70.2 kg (154 lb 12.2 oz)     Intake/Output Summary (Last 24 hours) at 04/12/15 1027 Last data filed at 04/11/15 1253  Gross per 24 hour  Intake    240 ml  Output      0 ml  Net    240 ml     Physical Exam  Awake Alert, Oriented X 3,  [place].AT,PERRAL Supple Neck,No JVD, Symmetrical Chest wall movement, diminished air movement bilaterally,  No Gallops,Rubs or new Murmurs, No Parasternal Heave +ve B.Sounds, Abd Soft, No tenderness, No organomegaly appriciated, No rebound - guarding or rigidity. No Cyanosis, Clubbing or edema, No new Rash or bruise     Data Review   Micro Results Recent Results (from the past 240 hour(s))  Blood Culture (routine x 2)     Status: None (Preliminary result)   Collection Time:  04/10/15  7:30 PM  Result Value Ref Range Status   Specimen Description BLOOD RIGHT ARM  Final   Special Requests BOTTLES DRAWN AEROBIC AND ANAEROBIC 5CC  Final   Culture NO GROWTH 2 DAYS  Final   Report Status PENDING  Incomplete  Blood Culture (routine x 2)     Status: None (Preliminary result)   Collection Time: 04/10/15  7:45 PM  Result Value Ref Range Status   Specimen Description BLOOD LEFT ARM  Final   Special Requests BOTTLES DRAWN AEROBIC AND ANAEROBIC 5CC  Final   Culture NO GROWTH 2 DAYS  Final   Report Status PENDING  Incomplete  Culture, blood (routine x 2)     Status: None (Preliminary result)   Collection Time: 04/11/15  1:35 AM  Result Value Ref Range Status   Specimen Description BLOOD ARM LEFT  Final   Special Requests BOTTLES DRAWN AEROBIC ONLY 1CC  Final   Culture NO GROWTH 1 DAY  Final   Report Status PENDING  Incomplete  Culture, blood (routine x 2)     Status: None (Preliminary result)   Collection Time: 04/11/15  1:42 AM  Result Value Ref Range Status   Specimen Description BLOOD HAND LEFT  Final   Special Requests BOTTLES DRAWN AEROBIC ONLY 1CC  Final   Culture NO GROWTH 1 DAY  Final   Report Status PENDING  Incomplete    Radiology Reports Dg Chest 2 View  04/10/2015  CLINICAL DATA:  Pain upper right breast towards medial border. 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 Chest pain and shortness breath for 2 days. EXAM: CHEST  2 VIEW COMPARISON:  Chest x-ray dated 12/28/2013. FINDINGS: Heart size is upper normal, not significantly changed compared to the previous exam. Overall cardiomediastinal silhouette is stable in size and configuration. Ill-defined opacities are seen within the right mid lung region and at the left lung base. A focal rounded nodular density overlying a mid thoracic vertebral body on the lateral view may be related to the right mid lung opacity. No pleural effusions seen. No pneumothorax. No acute osseous abnormality. IMPRESSION: 1. Ill-defined  opacity within the right mid lung region with an additional smaller ill-defined opacity at the left lung base. This could represent asymmetric edema or pneumonia. 2. Focal nodular density overlying a mid thoracic vertebral body on the lateral view, measuring 1.3 cm greatest dimension, which may be related to the right mid lung opacity. A neoplastic process cannot be excluded. Recommend chest CT for further characterization. Electronically Signed   By: Bary Richard M.D.   On: 04/10/2015 18:34   Ct Angio Chest Pe W/cm &/or Wo Cm  04/10/2015  CLINICAL DATA:  Right-sided chest pain for 2 days, shortness of breath, cough, chest pain, IV drug abuse, acute bacterial endocarditis EXAM: CT ANGIOGRAPHY CHEST WITH CONTRAST TECHNIQUE: Multidetector CT imaging of the chest was performed using the standard protocol during bolus administration of intravenous contrast. Multiplanar CT image reconstructions and MIPs were obtained to evaluate the vascular anatomy. CONTRAST:  80mL OMNIPAQUE IOHEXOL 350 MG/ML SOLN COMPARISON:  04/10/2015 chest radiograph FINDINGS: Mediastinum/Nodes: There is filling defect in the left lower lobe pulmonary artery at the hilum extending into numerous branches. Branches supplying the lateral left lower lobe appear totally occluded as this vessel extends into the area of consolidation in the anterior lateral left lower lobe described above. Left upper lobe pulmonary arteries are clear. Right pulmonary arteries are clear. There are numerous mediastinal lymph nodes. Sub carinal adenopathy measures 12 mm and right hilar adenopathy measures 12 mm. Lungs/Pleura: Posteriorly in the right lower lobe superior segment, there is a 5.2. x 3.8 x 4.5 cm well-defined rounded pleural-based abnormality that consist of consolidation mixed with areas of air attenuation. The walls of this lesion are mildly irregular and there is mild surrounding inflammatory change. There are multiple irregular right upper lobe pulmonary  nodules, seen on series 5, image number 47, 48, and 59. The largest of these is seen on image 59 and measures 10 mm. There is consolidation in the anterior inferior medial right middle lobe which appears most consistent with  atelectasis. In the left upper lobe there is an irregular 7 mm pulmonary nodule on image number 63. There is a 6 mm nodule in the left upper lobe on image number 45. There is consolidation in the anterior lateral aspect of the left lower lobe at the level of the diaphragm. There is a small right pleural effusion. Upper abdomen: The spleen is only partially imaged but appears possibly enlarged. The liver may also be enlarged. Musculoskeletal: No acute or focally suspicious findings. Review of the MIP images confirms the above findings. IMPRESSION: 1. Acute left-sided pulmonary embolism with pulmonary parenchymal infarction. Septic vs bland emboli considered. 2. Large complex masslike structure right lower lobe. Malignancy is not excluded. However, cavitating pneumonia or pulmonary abscess considered more likely, possibly from septic embolic seeding vs aspiration. 3. Multiple bilateral pulmonary nodules. Differential diagnostic possibilities include metastatic disease or septic emboli. 4. Several enlarged mediastinal lymph nodes could be reactive. Metastatic disease not excluded. Critical Value/emergent results were called by telephone at the time of interpretation on 04/10/2015 at 9:17 pm to Dr. Patria Mane , who verbally acknowledged these results. Electronically Signed   By: Esperanza Heir M.D.   On: 04/10/2015 21:18     CBC  Recent Labs Lab 04/10/15 1819 04/10/15 1935 04/11/15 0520 04/12/15 0140  WBC 15.2*  --  13.9* 12.9*  HGB 10.6*  --  10.0* 9.5*  HCT 34.2*  --  32.2* 30.4*  PLT 147*  --  128* 159  MCV 75.3*  --  76.3* 75.6*  MCH 23.3*  --  23.7* 23.6*  MCHC 31.0  --  31.1 31.3  RDW 18.2*  --  18.5* 18.2*  LYMPHSABS  --  1.9  --   --   MONOABS  --  1.4*  --   --   EOSABS   --  0.0  --   --   BASOSABS  --  0.0  --   --     Chemistries   Recent Labs Lab 04/10/15 1819 04/10/15 1935 04/11/15 0520 04/12/15 0140  NA 131*  --  134* 137  K 4.0  --  4.1 3.7  CL 97*  --  104 107  CO2 24  --  21* 21*  GLUCOSE 141*  --  111* 114*  BUN 9  --  8 9  CREATININE 0.85  --  0.77 0.71  CALCIUM 8.4*  --  7.9* 8.1*  AST  --  33  --   --   ALT  --  39  --   --   ALKPHOS  --  99  --   --   BILITOT  --  1.2  --   --    ------------------------------------------------------------------------------------------------------------------ estimated creatinine clearance is 92.6 mL/min (by C-G formula based on Cr of 0.71). ------------------------------------------------------------------------------------------------------------------ No results for input(s): HGBA1C in the last 72 hours. ------------------------------------------------------------------------------------------------------------------ No results for input(s): CHOL, HDL, LDLCALC, TRIG, CHOLHDL, LDLDIRECT in the last 72 hours. ------------------------------------------------------------------------------------------------------------------ No results for input(s): TSH, T4TOTAL, T3FREE, THYROIDAB in the last 72 hours.  Invalid input(s): FREET3 ------------------------------------------------------------------------------------------------------------------  Recent Labs  04/11/15 0142  RETICCTPCT 1.2    Coagulation profile No results for input(s): INR, PROTIME in the last 168 hours.  No results for input(s): DDIMER in the last 72 hours.  Cardiac Enzymes No results for input(s): CKMB, TROPONINI, MYOGLOBIN in the last 168 hours.  Invalid input(s): CK ------------------------------------------------------------------------------------------------------------------ Invalid input(s): POCBNP     Time Spent in minutes   40 minutes   Zetta Stoneman  M.D on 04/12/2015 at 10:27 AM  Between 7am to 7pm  - Pager - [phone]  After 7pm go to [email or web address] - password [facility]  [facility]   Office  [phone]

## 2015-04-12 NOTE — Progress Notes (Signed)
ANTIBIOTIC CONSULT NOTE - FOLLOW UP  Pharmacy Consult for Vancomycin  Indication: endocarditis of her prosthetic heart valve   No Known Allergies  Patient Measurements: Height: 5\' 4"  (162.6 cm) Weight: 161 lb 1.6 oz (73.074 kg) IBW/kg (Calculated) : 54.7   Vital Signs: Temp: 100 F (37.8 C) (10/31 1332) Temp Source: Oral (10/31 1332) BP: 124/82 mmHg (10/31 1332) Pulse Rate: 96 (10/31 1332)   Intake/Output from previous day: 10/30 0701 - 10/31 0700 In: 240 [P.O.:240] Out: -  Intake/Output from this shift:    Labs:  Recent Labs  04/10/15 1819 04/11/15 0520 04/12/15 0140  WBC 15.2* 13.9* 12.9*  HGB 10.6* 10.0* 9.5*  PLT 147* 128* 159  CREATININE 0.85 0.77 0.71  Estimated CrCl: > 100 ml/min  ID: Abx: Vanc 10/29>>  Zosyn 10/29>>10/30  CTX 10/30 >>  Microbiology: 10/29 Blood x 2 ngtd  10/29 Urine: ngF 10/30 Blood x2: ngtd   Assessment: 39yoF with 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  10/31: VT 8 on 750 mg Q8H and SCr 0.71  Goal of Therapy:  Vancomycin trough level 15-20 mcg/ml  Plan:  1. Increase vancomycin dose to 1250 mg q8h and move next maintenance dose up a few hours; trying to avoid q6h as will be challenging if continued in the outpt setting 2. [facility] 3. Following daily   Pollyann SamplesAndy Randolf Sansoucie, PharmD, BCPS 04/12/2015, 9:39 PM Pager: [phone]

## 2015-04-12 NOTE — Progress Notes (Signed)
[facility]   Subjective: Reports chest and back pain. Chest pain is sharp with associated dyspnea. No radiation. Had not received pain meds for morning.   Antibiotics:  Anti-infectives    Start     Dose/Rate Route Frequency Ordered Stop   04/11/15 1030  cefTRIAXone (ROCEPHIN) 2 g in dextrose 5 % 50 mL IVPB     2 g 100 mL/hr over 30 Minutes Intravenous Every 24 hours 04/11/15 1023     04/11/15 0400  piperacillin-tazobactam (ZOSYN) IVPB 3.375 g  Status:  Discontinued     3.375 g 12.5 mL/hr over 240 Minutes Intravenous Every 8 hours 04/10/15 1925 04/11/15 1023   04/11/15 0400  vancomycin (VANCOCIN) IVPB 750 mg/150 ml premix     750 mg 150 mL/hr over 60 Minutes Intravenous Every 8 hours 04/10/15 1925     04/10/15 1930  vancomycin (VANCOCIN) 1,500 mg in sodium chloride 0.9 % 500 mL IVPB     1,500 mg 250 mL/hr over 120 Minutes Intravenous  Once 04/10/15 1925 04/10/15 2235   04/10/15 1915  piperacillin-tazobactam (ZOSYN) IVPB 3.375 g     3.375 g 100 mL/hr over 30 Minutes Intravenous  Once 04/10/15 1910 04/10/15 2107   04/10/15 1915  vancomycin (VANCOCIN) IVPB 1000 mg/200 mL premix  Status:  Discontinued     1,000 mg 200 mL/hr over 60 Minutes Intravenous  Once 04/10/15 1910 04/10/15 1925      Medications: Scheduled Meds: . cefTRIAXone (ROCEPHIN)  IV  2 g Intravenous Q24H  . sodium chloride  3 mL Intravenous Q12H  . vancomycin  750 mg Intravenous Q8H   Continuous Infusions: . sodium chloride 75 mL/hr at 04/12/15 0815  . heparin 2,050 Units/hr (04/12/15 1147)   PRN Meds:.HYDROmorphone (DILAUDID) injection, oxyCODONE   Objective: Weight change:   Intake/Output Summary (Last 24 hours) at 04/12/15 1502 Last data filed at 04/12/15 1035  Gross per 24 hour  Intake    120 ml  Output      0 ml  Net    120 ml   Blood pressure 124/82, pulse 96, temperature 100 F (37.8 C), temperature source Oral, resp. rate 20, height 5\' 4"  (1.626 m),  weight 161 lb 1.6 oz (73.074 kg), last menstrual period 03/27/2015, SpO2 100 %. Temp:  [97.9 F (36.6 C)-100.6 F (38.1 C)] 100 F (37.8 C) (10/31 1332) Pulse Rate:  [81-102] 96 (10/31 1332) Resp:  [18-22] 20 (10/31 1332) BP: (109-124)/(80-86) 124/82 mmHg (10/31 1332) SpO2:  [94 %-100 %] 100 % (10/31 1332)  Physical Exam: General: Alert and awake, oriented x3, mild distress. HEENT: anicteric sclera, pupils reactive to light and accommodation, EOMI CVS: regular rate, normal r,  no murmur rubs or gallops Chest: clear to auscultation bilaterally, no wheezing, rales or rhonchi Abdomen: soft nontender, nondistended, normal bowel sounds, Extremities: no clubbing or edema noted bilaterally Skin: no rashes, multiple small wounds on UE Lymph: no new lymphadenopathy Neuro: nonfocal  CBC: CBC Latest Ref Rng 04/12/2015 04/11/2015 04/10/2015  WBC 4.0 - 10.5 K/uL 12.9(H) 13.9(H) 15.2(H)  Hemoglobin 12.0 - 15.0 g/dL 1.6(X9.5(L) 10.0(L) 10.6(L)  Hematocrit 36.0 - 46.0 % 30.4(L) 32.2(L) 34.2(L)  Platelets 150 - 400 K/uL 159 128(L) 147(L)    BMET  Recent Labs  04/11/15 0520 04/12/15 0140  NA 134* 137  K 4.1 3.7  CL 104 107  CO2 21* 21*  GLUCOSE 111* 114*  BUN 8 9  CREATININE 0.77 0.71  CALCIUM 7.9* 8.1*  Liver Panel   Recent Labs  04/10/15 1935  PROT 7.1  ALBUMIN 2.7*  AST 33  ALT 39  ALKPHOS 99  BILITOT 1.2  BILIDIR 0.3  IBILI 0.9    Sedimentation Rate No results for input(s): ESRSEDRATE in the last 72 hours.   C-Reactive Protein No results for input(s): CRP in the last 72 hours.  Micro Results: Recent Results (from the past 720 hour(s))  Blood Culture (routine x 2)     Status: None (Preliminary result)   Collection Time: 04/10/15  7:30 PM  Result Value Ref Range Status   Specimen Description BLOOD RIGHT ARM  Final   Special Requests BOTTLES DRAWN AEROBIC AND ANAEROBIC 5CC  Final   Culture NO GROWTH 2 DAYS  Final   Report Status PENDING  Incomplete  Blood  Culture (routine x 2)     Status: None (Preliminary result)   Collection Time: 04/10/15  7:45 PM  Result Value Ref Range Status   Specimen Description BLOOD LEFT ARM  Final   Special Requests BOTTLES DRAWN AEROBIC AND ANAEROBIC 5CC  Final   Culture NO GROWTH 2 DAYS  Final   Report Status PENDING  Incomplete  Urine culture     Status: None   Collection Time: 04/10/15  7:50 PM  Result Value Ref Range Status   Specimen Description URINE, RANDOM  Final   Special Requests NONE  Final   Culture NO GROWTH 1 DAY  Final   Report Status 04/12/2015 FINAL  Final  Culture, blood (routine x 2)     Status: None (Preliminary result)   Collection Time: 04/11/15  1:35 AM  Result Value Ref Range Status   Specimen Description BLOOD ARM LEFT  Final   Special Requests BOTTLES DRAWN AEROBIC ONLY 1CC  Final   Culture NO GROWTH 1 DAY  Final   Report Status PENDING  Incomplete  Culture, blood (routine x 2)     Status: None (Preliminary result)   Collection Time: 04/11/15  1:42 AM  Result Value Ref Range Status   Specimen Description BLOOD HAND LEFT  Final   Special Requests BOTTLES DRAWN AEROBIC ONLY 1CC  Final   Culture NO GROWTH 1 DAY  Final   Report Status PENDING  Incomplete    Studies/Results: Dg Chest 2 View  04/10/2015  CLINICAL DATA:  Pain upper right breast towards medial border. 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 Chest pain and shortness breath for 2 days. EXAM: CHEST  2 VIEW COMPARISON:  Chest x-ray dated 12/28/2013. FINDINGS: Heart size is upper normal, not significantly changed compared to the previous exam. Overall cardiomediastinal silhouette is stable in size and configuration. Ill-defined opacities are seen within the right mid lung region and at the left lung base. A focal rounded nodular density overlying a mid thoracic vertebral body on the lateral view may be related to the right mid lung opacity. No pleural effusions seen. No pneumothorax. No acute osseous abnormality. IMPRESSION: 1.  Ill-defined opacity within the right mid lung region with an additional smaller ill-defined opacity at the left lung base. This could represent asymmetric edema or pneumonia. 2. Focal nodular density overlying a mid thoracic vertebral body on the lateral view, measuring 1.3 cm greatest dimension, which may be related to the right mid lung opacity. A neoplastic process cannot be excluded. Recommend chest CT for further characterization. Electronically Signed   By: Bary Richard M.D.   On: 04/10/2015 18:34   Ct Angio Chest Pe W/cm &/or Wo Cm  04/10/2015  CLINICAL DATA:  Right-sided chest pain for 2 days, shortness of breath, cough, chest pain, IV drug abuse, acute bacterial endocarditis EXAM: CT ANGIOGRAPHY CHEST WITH CONTRAST TECHNIQUE: Multidetector CT imaging of the chest was performed using the standard protocol during bolus administration of intravenous contrast. Multiplanar CT image reconstructions and MIPs were obtained to evaluate the vascular anatomy. CONTRAST:  80mL OMNIPAQUE IOHEXOL 350 MG/ML SOLN COMPARISON:  04/10/2015 chest radiograph FINDINGS: Mediastinum/Nodes: There is filling defect in the left lower lobe pulmonary artery at the hilum extending into numerous branches. Branches supplying the lateral left lower lobe appear totally occluded as this vessel extends into the area of consolidation in the anterior lateral left lower lobe described above. Left upper lobe pulmonary arteries are clear. Right pulmonary arteries are clear. There are numerous mediastinal lymph nodes. Sub carinal adenopathy measures 12 mm and right hilar adenopathy measures 12 mm. Lungs/Pleura: Posteriorly in the right lower lobe superior segment, there is a 5.2. x 3.8 x 4.5 cm well-defined rounded pleural-based abnormality that consist of consolidation mixed with areas of air attenuation. The walls of this lesion are mildly irregular and there is mild surrounding inflammatory change. There are multiple irregular right upper  lobe pulmonary nodules, seen on series 5, image number 47, 48, and 59. The largest of these is seen on image 59 and measures 10 mm. There is consolidation in the anterior inferior medial right middle lobe which appears most consistent with atelectasis. In the left upper lobe there is an irregular 7 mm pulmonary nodule on image number 63. There is a 6 mm nodule in the left upper lobe on image number 45. There is consolidation in the anterior lateral aspect of the left lower lobe at the level of the diaphragm. There is a small right pleural effusion. Upper abdomen: The spleen is only partially imaged but appears possibly enlarged. The liver may also be enlarged. Musculoskeletal: No acute or focally suspicious findings. Review of the MIP images confirms the above findings. IMPRESSION: 1. Acute left-sided pulmonary embolism with pulmonary parenchymal infarction. Septic vs bland emboli considered. 2. Large complex masslike structure right lower lobe. Malignancy is not excluded. However, cavitating pneumonia or pulmonary abscess considered more likely, possibly from septic embolic seeding vs aspiration. 3. Multiple bilateral pulmonary nodules. Differential diagnostic possibilities include metastatic disease or septic emboli. 4. Several enlarged mediastinal lymph nodes could be reactive. Metastatic disease not excluded. Critical Value/emergent results were called by telephone at the time of interpretation on 04/10/2015 at 9:17 pm to Dr. Patria Mane , who verbally acknowledged these results. Electronically Signed   By: Esperanza Heir M.D.   On: 04/10/2015 21:18    Assessment/Plan:  INTERVAL HISTORY:  10/29 CT of chest which showed multiple nodules, lesions. She was started on vanco/zosyn. 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 +recent IVDA. Concern for septic emboli on CT chest.  Septic emboli, endocarditis: -BCx 10/29 and 10/30  NGTD -TTE, consider TEE -Continue vanc and ceftriaxone -HIV nonreactive -CVTS consulted and following  Hepatitis C:  -Viral load and genotype pending -Can follow up for elastogram   LOS: 2 days   Griffin Basil, MD  Internal Medicine PGY-2 04/12/2015, 3:02 PM

## 2015-04-12 NOTE — Consult Note (Signed)
Reason for Consult:Prosthetic valve endocarditis Referring Physician: Dr. Laurey Arrow Melanie Crane is an 39 y.o. female.  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 She was treated in [place] in April of this year for pseudomonas prosthetic valve endocarditis with a prolonged course of antibiotics.  She seen in the ED at [facility] recently with c/o diffuse pain. She was felt to be in withdrawal. She left AMA. She then presented here with 3 days of worsening CP and shortness of breath. Workup included a chest CT which showed a PE and a right lower lobe abscess. An echocardiogram showed a vegetation on the tricuspid valve. There was no TR.   She has been using IV heroin recently. She continues to have right sided pleuritic chest pain. She has not noted swelling in her legs.  Past Medical History  Diagnosis Date  . Anemia   . IVDU (intravenous drug user)   . 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 Hepatitis C   . Miscarriage     Five times    Past Surgical History  Procedure Laterality Date  . Bunionectomy    . Tonsillectomy    . Tricuspid valve replacement      Family History  Problem Relation Age of Onset  . Hypertension Father   . Kidney failure Father   . Hyperlipidemia Mother   . CAD Mother     Social History:  reports that she has been smoking Cigarettes.  She has never used smokeless tobacco. She reports that she uses illicit drugs (IV). She reports that she does not drink alcohol.  Allergies: No Known Allergies  Medications:  Scheduled: . cefTRIAXone (ROCEPHIN)  IV  2 g Intravenous Q24H  . sodium chloride  3 mL Intravenous Q12H  . vancomycin  750 mg Intravenous Q8H    Results for orders placed or performed during the hospital encounter of 04/10/15 (from the past 48 hour(s))  Basic metabolic panel     Status: Abnormal   Collection  Time: 04/10/15  6:19 PM  Result Value Ref Range   Sodium 131 (L) 135 - 145 mmol/L   Potassium 4.0 3.5 - 5.1 mmol/L   Chloride 97 (L) 101 - 111 mmol/L   CO2 24 22 - 32 mmol/L   Glucose, Bld 141 (H) 65 - 99 mg/dL   BUN 9 6 - 20 mg/dL   Creatinine, Ser 0.85 0.44 - 1.00 mg/dL   Calcium 8.4 (L) 8.9 - 10.3 mg/dL   GFR calc non Af Amer >60 >60 mL/min   GFR calc Af Amer >60 >60 mL/min    Comment: (NOTE) The eGFR has been calculated using the CKD EPI equation. This calculation has not been validated in all clinical situations. eGFR's persistently <60 mL/min signify possible Chronic Kidney Disease.    Anion gap 10 5 - 15  CBC     Status: Abnormal   Collection Time: 04/10/15  6:19 PM  Result Value Ref Range   WBC 15.2 (H) 4.0 - 10.5 K/uL   RBC 4.54 3.87 - 5.11 MIL/uL   Hemoglobin 10.6 (L) 12.0 - 15.0 g/dL   HCT 34.2 (L) 36.0 - 46.0 %   MCV 75.3 (L) 78.0 - 100.0 fL   MCH 23.3 (L) 26.0 - 34.0 pg   MCHC 31.0 30.0 - 36.0 g/dL   RDW 18.2 (H) 11.5 - 15.5 %  Platelets 147 (L) 150 - 400 K/uL  I-stat troponin, ED     Status: None   Collection Time: 04/10/15  6:37 PM  Result Value Ref Range   Troponin i, poc 0.02 0.00 - 0.08 ng/mL   Comment 3            Comment: Due to the release kinetics of cTnI, a negative result within the first hours of the onset of symptoms does not rule out myocardial infarction with certainty. If myocardial infarction is still suspected, repeat the test at appropriate intervals.   Blood Culture (routine x 2)     Status: None (Preliminary result)   Collection Time: 04/10/15  7:30 PM  Result Value Ref Range   Specimen Description BLOOD RIGHT ARM    Special Requests BOTTLES DRAWN AEROBIC AND ANAEROBIC 5CC    Culture NO GROWTH 2 DAYS    Report Status PENDING   Hepatic function panel     Status: Abnormal   Collection Time: 04/10/15  7:35 PM  Result Value Ref Range   Total Protein 7.1 6.5 - 8.1 g/dL   Albumin 2.7 (L) 3.5 - 5.0 g/dL   AST 33 15 - 41 U/L   ALT 39  14 - 54 U/L   Alkaline Phosphatase 99 38 - 126 U/L   Total Bilirubin 1.2 0.3 - 1.2 mg/dL   Bilirubin, Direct 0.3 0.1 - 0.5 mg/dL   Indirect Bilirubin 0.9 0.3 - 0.9 mg/dL  Differential     Status: Abnormal   Collection Time: 04/10/15  7:35 PM  Result Value Ref Range   Neutrophils Relative % 77 %   Neutro Abs 10.6 (H) 1.7 - 7.7 K/uL   Lymphocytes Relative 13 %   Lymphs Abs 1.9 0.7 - 4.0 K/uL   Monocytes Relative 10 %   Monocytes Absolute 1.4 (H) 0.1 - 1.0 K/uL   Eosinophils Relative 0 %   Eosinophils Absolute 0.0 0.0 - 0.7 K/uL   Basophils Relative 0 %   Basophils Absolute 0.0 0.0 - 0.1 K/uL  Blood Culture (routine x 2)     Status: None (Preliminary result)   Collection Time: 04/10/15  7:45 PM  Result Value Ref Range   Specimen Description BLOOD LEFT ARM    Special Requests BOTTLES DRAWN AEROBIC AND ANAEROBIC 5CC    Culture NO GROWTH 2 DAYS    Report Status PENDING   Urinalysis, Routine w reflex microscopic (not at [facility])     Status: Abnormal   Collection Time: 04/10/15  7:50 PM  Result Value Ref Range   Color, Urine YELLOW YELLOW   APPearance CLEAR CLEAR   Specific Gravity, Urine 1.010 1.005 - 1.030   pH 7.5 5.0 - 8.0   Glucose, UA NEGATIVE NEGATIVE mg/dL   Hgb urine dipstick NEGATIVE NEGATIVE   Bilirubin Urine NEGATIVE NEGATIVE   Ketones, ur NEGATIVE NEGATIVE mg/dL   Protein, ur NEGATIVE NEGATIVE mg/dL   Urobilinogen, UA 4.0 (H) 0.0 - 1.0 mg/dL   Nitrite NEGATIVE NEGATIVE   Leukocytes, UA NEGATIVE NEGATIVE    Comment: MICROSCOPIC NOT DONE ON URINES WITH NEGATIVE PROTEIN, BLOOD, LEUKOCYTES, NITRITE, OR GLUCOSE <1000 mg/dL.  Urine culture     Status: None   Collection Time: 04/10/15  7:50 PM  Result Value Ref Range   Specimen Description URINE, RANDOM    Special Requests NONE    Culture NO GROWTH 1 DAY    Report Status 04/12/2015 FINAL   POC urine preg, ED (not at [facility])  Status: None   Collection Time: 04/10/15  7:57 PM  Result Value Ref Range   Preg Test, Ur  NEGATIVE NEGATIVE    Comment:        THE SENSITIVITY OF THIS METHODOLOGY IS >24 mIU/mL   I-Stat CG4 Lactic Acid, ED  (not at  [facility])     Status: None   Collection Time: 04/10/15  7:58 PM  Result Value Ref Range   Lactic Acid, Venous 1.29 0.5 - 2.0 mmol/L  I-Stat CG4 Lactic Acid, ED  (not at  [facility])     Status: None   Collection Time: 04/10/15 10:15 PM  Result Value Ref Range   Lactic Acid, Venous 0.68 0.5 - 2.0 mmol/L  Osmolality, urine     Status: Abnormal   Collection Time: 04/11/15  1:13 AM  Result Value Ref Range   Osmolality, Ur 238 (L) 390 - 1090 mOsm/kg    Comment: Performed at [facility], blood (routine x 2)     Status: None (Preliminary result)   Collection Time: 04/11/15  1:35 AM  Result Value Ref Range   Specimen Description BLOOD ARM LEFT    Special Requests BOTTLES DRAWN AEROBIC ONLY 1CC    Culture NO GROWTH 1 DAY    Report Status PENDING   Culture, blood (routine x 2)     Status: None (Preliminary result)   Collection Time: 04/11/15  1:42 AM  Result Value Ref Range   Specimen Description BLOOD HAND LEFT    Special Requests BOTTLES DRAWN AEROBIC ONLY [place]    Culture NO GROWTH 1 DAY    Report Status PENDING   HIV antibody     Status: None   Collection Time: 04/11/15  1:42 AM  Result Value Ref Range   HIV Screen 4th Generation wRfx Non Reactive Non Reactive    Comment: (NOTE) Performed At: [facility] [place], [place] [id number] Lindon Romp MD FM:[id number]   Osmolality     Status: None   Collection Time: 04/11/15  1:42 AM  Result Value Ref Range   Osmolality 288 275 - 300 mOsm/kg    Comment: Performed at [facility]  Reticulocytes     Status: None   Collection Time: 04/11/15  1:42 AM  Result Value Ref Range   Retic Ct Pct 1.2 0.4 - 3.1 %   RBC. 4.09 3.87 - 5.11 MIL/uL   Retic Count, Manual 49.1 19.0 - 186.0 K/uL  Technologist smear review     Status: None   Collection Time: 04/11/15  1:42 AM   Result Value Ref Range   Tech Review ATYPICAL LYMPHOCYTES     Comment: ELLIPTOCYTES  Lactate dehydrogenase     Status: Abnormal   Collection Time: 04/11/15  1:42 AM  Result Value Ref Range   LDH 217 (H) 98 - 192 U/L  Heparin level (unfractionated)     Status: Abnormal   Collection Time: 04/11/15  5:20 AM  Result Value Ref Range   Heparin Unfractionated <0.10 (L) 0.30 - 0.70 IU/mL    Comment:        IF HEPARIN RESULTS ARE BELOW EXPECTED VALUES, AND PATIENT DOSAGE HAS BEEN CONFIRMED, SUGGEST FOLLOW UP TESTING OF ANTITHROMBIN III LEVELS.   CBC     Status: Abnormal   Collection Time: 04/11/15  5:20 AM  Result Value Ref Range   WBC 13.9 (H) 4.0 - 10.5 K/uL   RBC 4.22 3.87 - 5.11 MIL/uL   Hemoglobin 10.0 (L) 12.0 -  15.0 g/dL   HCT 32.2 (L) 36.0 - 46.0 %   MCV 76.3 (L) 78.0 - 100.0 fL   MCH 23.7 (L) 26.0 - 34.0 pg   MCHC 31.1 30.0 - 36.0 g/dL   RDW 18.5 (H) 11.5 - 15.5 %   Platelets 128 (L) 150 - 400 K/uL  Basic metabolic panel     Status: Abnormal   Collection Time: 04/11/15  5:20 AM  Result Value Ref Range   Sodium 134 (L) 135 - 145 mmol/L   Potassium 4.1 3.5 - 5.1 mmol/L   Chloride 104 101 - 111 mmol/L   CO2 21 (L) 22 - 32 mmol/L   Glucose, Bld 111 (H) 65 - 99 mg/dL   BUN 8 6 - 20 mg/dL   Creatinine, Ser 0.77 0.44 - 1.00 mg/dL   Calcium 7.9 (L) 8.9 - 10.3 mg/dL   GFR calc non Af Amer >60 >60 mL/min   GFR calc Af Amer >60 >60 mL/min    Comment: (NOTE) The eGFR has been calculated using the CKD EPI equation. This calculation has not been validated in all clinical situations. eGFR's persistently <60 mL/min signify possible Chronic Kidney Disease.    Anion gap 9 5 - 15  Heparin level (unfractionated)     Status: Abnormal   Collection Time: 04/11/15  5:36 PM  Result Value Ref Range   Heparin Unfractionated <0.10 (L) 0.30 - 0.70 IU/mL    Comment:        IF HEPARIN RESULTS ARE BELOW EXPECTED VALUES, AND PATIENT DOSAGE HAS BEEN CONFIRMED, SUGGEST FOLLOW UP TESTING OF  ANTITHROMBIN III LEVELS.   CBC     Status: Abnormal   Collection Time: 04/12/15  1:40 AM  Result Value Ref Range   WBC 12.9 (H) 4.0 - 10.5 K/uL   RBC 4.02 3.87 - 5.11 MIL/uL   Hemoglobin 9.5 (L) 12.0 - 15.0 g/dL   HCT 30.4 (L) 36.0 - 46.0 %   MCV 75.6 (L) 78.0 - 100.0 fL   MCH 23.6 (L) 26.0 - 34.0 pg   MCHC 31.3 30.0 - 36.0 g/dL   RDW 18.2 (H) 11.5 - 15.5 %   Platelets 159 150 - 400 K/uL  Basic metabolic panel     Status: Abnormal   Collection Time: 04/12/15  1:40 AM  Result Value Ref Range   Sodium 137 135 - 145 mmol/L   Potassium 3.7 3.5 - 5.1 mmol/L   Chloride 107 101 - 111 mmol/L   CO2 21 (L) 22 - 32 mmol/L   Glucose, Bld 114 (H) 65 - 99 mg/dL   BUN 9 6 - 20 mg/dL   Creatinine, Ser 0.71 0.44 - 1.00 mg/dL   Calcium 8.1 (L) 8.9 - 10.3 mg/dL   GFR calc non Af Amer >60 >60 mL/min   GFR calc Af Amer >60 >60 mL/min    Comment: (NOTE) The eGFR has been calculated using the CKD EPI equation. This calculation has not been validated in all clinical situations. eGFR's persistently <60 mL/min signify possible Chronic Kidney Disease.    Anion gap 9 5 - 15  Heparin level (unfractionated)     Status: Abnormal   Collection Time: 04/12/15  1:40 AM  Result Value Ref Range   Heparin Unfractionated <0.10 (L) 0.30 - 0.70 IU/mL    Comment:        IF HEPARIN RESULTS ARE BELOW EXPECTED VALUES, AND PATIENT DOSAGE HAS BEEN CONFIRMED, SUGGEST FOLLOW UP TESTING OF ANTITHROMBIN III LEVELS.   Heparin level (unfractionated)  Status: Abnormal   Collection Time: 04/12/15 10:09 AM  Result Value Ref Range   Heparin Unfractionated 0.10 (L) 0.30 - 0.70 IU/mL    Comment:        IF HEPARIN RESULTS ARE BELOW EXPECTED VALUES, AND PATIENT DOSAGE HAS BEEN CONFIRMED, SUGGEST FOLLOW UP TESTING OF ANTITHROMBIN III LEVELS.     Dg Chest 2 View  04/10/2015  CLINICAL DATA:  Pain upper right breast towards medial border. 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 Chest pain and shortness breath for 2 days.  EXAM: CHEST  2 VIEW COMPARISON:  Chest x-ray dated 12/28/2013. FINDINGS: Heart size is upper normal, not significantly changed compared to the previous exam. Overall cardiomediastinal silhouette is stable in size and configuration. Ill-defined opacities are seen within the right mid lung region and at the left lung base. A focal rounded nodular density overlying a mid thoracic vertebral body on the lateral view may be related to the right mid lung opacity. No pleural effusions seen. No pneumothorax. No acute osseous abnormality. IMPRESSION: 1. Ill-defined opacity within the right mid lung region with an additional smaller ill-defined opacity at the left lung base. This could represent asymmetric edema or pneumonia. 2. Focal nodular density overlying a mid thoracic vertebral body on the lateral view, measuring 1.3 cm greatest dimension, which may be related to the right mid lung opacity. A neoplastic process cannot be excluded. Recommend chest CT for further characterization. Electronically Signed   By: Franki Cabot M.D.   On: 04/10/2015 18:34   Ct Angio Chest Pe W/cm &/or Wo Cm  04/10/2015  CLINICAL DATA:  Right-sided chest pain for 2 days, shortness of breath, cough, chest pain, IV drug abuse, acute bacterial endocarditis EXAM: CT ANGIOGRAPHY CHEST WITH CONTRAST TECHNIQUE: Multidetector CT imaging of the chest was performed using the standard protocol during bolus administration of intravenous contrast. Multiplanar CT image reconstructions and MIPs were obtained to evaluate the vascular anatomy. CONTRAST:  96m OMNIPAQUE IOHEXOL 350 MG/ML SOLN COMPARISON:  04/10/2015 chest radiograph FINDINGS: Mediastinum/Nodes: There is filling defect in the left lower lobe pulmonary artery at the hilum extending into numerous branches. Branches supplying the lateral left lower lobe appear totally occluded as this vessel extends into the area of consolidation in the anterior lateral left lower lobe described above. Left upper  lobe pulmonary arteries are clear. Right pulmonary arteries are clear. There are numerous mediastinal lymph nodes. Sub carinal adenopathy measures 12 mm and right hilar adenopathy measures 12 mm. Lungs/Pleura: Posteriorly in the right lower lobe superior segment, there is a 5.2. x 3.8 x 4.5 cm well-defined rounded pleural-based abnormality that consist of consolidation mixed with areas of air attenuation. The walls of this lesion are mildly irregular and there is mild surrounding inflammatory change. There are multiple irregular right upper lobe pulmonary nodules, seen on series 5, image number 47, 48, and 59. The largest of these is seen on image 59 and measures 10 mm. There is consolidation in the anterior inferior medial right middle lobe which appears most consistent with atelectasis. In the left upper lobe there is an irregular 7 mm pulmonary nodule on image number 63. There is a 6 mm nodule in the left upper lobe on image number 45. There is consolidation in the anterior lateral aspect of the left lower lobe at the level of the diaphragm. There is a small right pleural effusion. Upper abdomen: The spleen is only partially imaged but appears possibly enlarged. The liver may also be enlarged. Musculoskeletal: No acute or  focally suspicious findings. Review of the MIP images confirms the above findings. IMPRESSION: 1. Acute left-sided pulmonary embolism with pulmonary parenchymal infarction. Septic vs bland emboli considered. 2. Large complex masslike structure right lower lobe. Malignancy is not excluded. However, cavitating pneumonia or pulmonary abscess considered more likely, possibly from septic embolic seeding vs aspiration. 3. Multiple bilateral pulmonary nodules. Differential diagnostic possibilities include metastatic disease or septic emboli. 4. Several enlarged mediastinal lymph nodes could be reactive. Metastatic disease not excluded. Critical Value/emergent results were called by telephone at the  time of interpretation on 04/10/2015 at 9:17 pm to Dr. Venora Maples , who verbally acknowledged these results. Electronically Signed   By: Skipper Cliche M.D.   On: 04/10/2015 21:18    Review of Systems  Constitutional: Positive for fever, chills and malaise/fatigue.  Respiratory: Positive for shortness of breath.   Cardiovascular: Positive for chest pain.   Blood pressure 124/82, pulse 96, temperature 100 F (37.8 C), temperature source Oral, resp. rate 20, height _0  (1.626 m), weight 161 lb 1.6 oz (73.074 kg), last menstrual period 03/27/2015, SpO2 100 %. Physical Exam  Vitals reviewed. Constitutional: She is oriented to person, place, and time. She appears distressed (mildly).  Anxious, uncomfortable  HENT:  Head: Normocephalic and atraumatic.  Eyes: Conjunctivae and EOM are normal. No scleral icterus.  Neck: Neck supple. No JVD present. No tracheal deviation present.  Cardiovascular: Normal rate, regular rhythm, normal heart sounds and intact distal pulses.   No murmur heard. Respiratory: She has no wheezes. She exhibits tenderness.  Rhonchi on right  GI: Soft. She exhibits no distension. There is no tenderness.  Musculoskeletal: She exhibits no edema.  Lymphadenopathy:    She has no cervical adenopathy.  Neurological: She is alert and oriented to person, place, and time. No cranial nerve deficit.  Skin: Skin is warm and dry.   ECHOCARDIOGRAM Study Conclusions  - Left ventricle: The cavity size was normal. There was moderate concentric hypertrophy. Systolic function was normal. The estimated ejection fraction was in the range of 55% to 60%. Wall motion was normal; there were no regional wall motion abnormalities. There was an increased relative contribution of atrial contraction to ventricular filling. Doppler parameters are consistent with abnormal left ventricular relaxation (grade 1 diastolic dysfunction). - Right atrium: The atrium was moderately to  severely dilated. - Atrial septum: There was a probable, small patent foramen ovale. - Tricuspid valve: S/P Trisupid valve replacement is present. There appears to be a shaggy mobile density on the ventricular side of the valve consistent with vegetation. This is best seen in the apical 4 chamber view.  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 She has septic PE and a right lower lobe abscess with pleuritic chest pain.  This presents a difficult clinical dilemma as she has not changed her habits since her valve replacement. It will be difficult, if not impossible, to clear the valve with IV antibiotics. She is at extremely high risk to get recurrent endocarditis if a redo TVR is done.   For now she needs IV antibiotics and pain control for her pleuritic chest pain. I suspect we will eventually have to do a redo TVR, but will wait to see what the organism is and follow her clinical course.  She needs a Cardiology consult.  Recommend TEE.    Remo Lipps  Chaya Jan 04/12/2015, 2:28 PM      [place]

## 2015-04-12 NOTE — Progress Notes (Signed)
ANTICOAGULATION CONSULT NOTE  Pharmacy Consult for heparin Indication: pulmonary embolus  No Known Allergies  Patient Measurements: Height: 5\' 4"  (162.6 cm) Weight: 161 lb 1.6 oz (73.074 kg) IBW/kg (Calculated) : 54.7 Heparin Dosing Weight: 70kg  Vital Signs: Temp: 97.9 F (36.6 C) (10/31 0819) Temp Source: Oral (10/31 0819) BP: 109/80 mmHg (10/31 0819) Pulse Rate: 81 (10/31 0819)  Labs:  Recent Labs  04/10/15 1819  04/11/15 0520 04/11/15 1736 04/12/15 0140 04/12/15 1009  HGB 10.6*  --  10.0*  --  9.5*  --   HCT 34.2*  --  32.2*  --  30.4*  --   PLT 147*  --  128*  --  159  --   HEPARINUNFRC  --   < > <0.10* <0.10* <0.10* 0.10*  CREATININE 0.85  --  0.77  --  0.71  --   < > = values in this interval not displayed.  Estimated Creatinine Clearance: 92.6 mL/min (by C-G formula based on Cr of 0.71).   Medical History: Past Medical History  Diagnosis Date  . Anemia   . IVDU (intravenous drug user)   . 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 Hepatitis C   . Miscarriage     Five times   Assessment: This is a 39 y.o. female with a PMHx of R sided bacterial endocarditis/tricuspid vegetations, continued IVDU (injects Opana), and anemia, who presented to the ED with complaints of gradual onset chest pain and shortness of breath 2 days. CTA showed possible left sided PE or septic emboli. ID leans in favor of septic emboli.   Heparin level finally detectable but low at 0.1 - RN notes that line was being diluted with saline and she changed this. H/H low-stable. Plts 159. No bleeding noted. Discussed possible change to Lovenox but Dr. Randol KernElgergawy wants to keep for now until CVTS sees patient.  Goal of Therapy:  Heparin level 0.3-0.7 units/ml Monitor platelets by anticoagulation protocol: Yes   Plan:  Rebolus heparin 4000 units x1 (aggressive with possible PE and barely detectable level) Increase heparin drip rate to 2050  units/hr 6 hour heparin level Daily HL, CBC  Link SnufferJessica Jacqueline Delapena, PharmD, BCPS Clinical Pharmacist [phone] 04/12/2015

## 2015-04-13 DIAGNOSIS — I749 Embolism and thrombosis of unspecified artery: Secondary | ICD-10-CM

## 2015-04-13 DIAGNOSIS — I76 Septic arterial embolism: Secondary | ICD-10-CM

## 2015-04-13 DIAGNOSIS — Z8619 Personal history of other infectious and parasitic diseases: Secondary | ICD-10-CM

## 2015-04-13 LAB — BASIC METABOLIC PANEL
ANION GAP: 10 (ref 5–15)
BUN: 10 mg/dL (ref 6–20)
CHLORIDE: 108 mmol/L (ref 101–111)
CO2: 19 mmol/L — AB (ref 22–32)
CREATININE: 0.67 mg/dL (ref 0.44–1.00)
Calcium: 8.1 mg/dL — ABNORMAL LOW (ref 8.9–10.3)
GFR calc non Af Amer: >60 mL/min (ref >60)
Glucose, Bld: 108 mg/dL — ABNORMAL HIGH (ref 65–99)
POTASSIUM: 3.7 mmol/L (ref 3.5–5.1)
SODIUM: 137 mmol/L (ref 135–145)

## 2015-04-13 LAB — CBC
HEMATOCRIT: 29.2 % — AB (ref 36.0–46.0)
HEMOGLOBIN: 8.7 g/dL — AB (ref 12.0–15.0)
MCH: 22.7 pg — ABNORMAL LOW (ref 26.0–34.0)
MCHC: 29.8 g/dL — AB (ref 30.0–36.0)
MCV: 76.2 fL — ABNORMAL LOW (ref 78.0–100.0)
Platelets: 179 10*3/uL (ref 150–400)
RBC: 3.83 MIL/uL — AB (ref 3.87–5.11)
RDW: 18 % — ABNORMAL HIGH (ref 11.5–15.5)
WBC: 9.1 10*3/uL (ref 4.0–10.5)

## 2015-04-13 LAB — HEPARIN LEVEL (UNFRACTIONATED)
HEPARIN UNFRACTIONATED: 0.24 [IU]/mL — AB (ref 0.30–0.70)
HEPARIN UNFRACTIONATED: 0.46 [IU]/mL (ref 0.30–0.70)
HEPARIN UNFRACTIONATED: 0.35 [IU]/mL (ref 0.30–0.70)

## 2015-04-13 LAB — HEPATITIS C VRS RNA DETECT BY PCR-QUAL: Hepatitis C Vrs RNA by PCR-Qual: POSITIVE — AB

## 2015-04-13 MED ORDER — HEPARIN BOLUS VIA INFUSION
3000.0000 [IU] | Freq: Once | INTRAVENOUS | Status: AC
  Administered 2015-04-13: 3000 [IU] via INTRAVENOUS
  Filled 2015-04-13: qty 3000

## 2015-04-13 MED ORDER — SODIUM CHLORIDE 0.9 % IV SOLN: INTRAVENOUS | Status: DC

## 2015-04-13 MED ORDER — PIPERACILLIN-TAZOBACTAM 3.375 G IVPB
3.3750 g | Freq: Three times a day (TID) | INTRAVENOUS | Status: DC
  Administered 2015-04-13 – 2015-04-19 (×18): 3.375 g via INTRAVENOUS
  Filled 2015-04-13 (×20): qty 50

## 2015-04-13 NOTE — Progress Notes (Signed)
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  She presented in August 2016 as well with chest pain and TTE without obvious vegetation.  Now with vegetation on TV and septic emboli.  Possible left sided PE.  Also with a history of GBS IE.    CT scan of lungs independently reviewed and with filling defect difficult to r/o clot PE.  Previous record from [facility] reviewed as above, Pseudomonas and sensitivities noted.    Physical Exam: Constitutional:  Filed Vitals:   04/13/15 0414  BP: 118/96  Pulse: 91  Temp: 98.7 F (37.1 C)  Resp: 34  ; patient appears in NAD Eyes: anicteric HENT: no thrush Respiratory: Normal respiratory effort; CTA B Cardiovascular: Tachy RR  Constitutional: positive for fatigue or negative for fevers and chills Cardiovascular: positive for chest pain Gastrointestinal: negative for diarrhea  Lab Results  Component Value Date   WBC 9.1 04/13/2015   HGB 8.7* 04/13/2015   HCT 29.2* 04/13/2015   MCV 76.2* 04/13/2015   PLT 179 04/13/2015    Lab Results  Component Value Date   CREATININE 0.67 04/13/2015   BUN 10 04/13/2015   NA 137 04/13/2015   K 3.7 04/13/2015   CL 108 04/13/2015   CO2 19* 04/13/2015    Lab Results  Component Value Date   ALT 39 04/10/2015   AST 33 04/10/2015   ALKPHOS 99 04/10/2015     Microbiology: Recent Results (from the past 240 hour(s))  Blood Culture (routine x 2)     Status: None (Preliminary result)   Collection Time: 04/10/15  7:30 PM  Result Value Ref Range Status   Specimen Description BLOOD RIGHT ARM  Final   Special Requests BOTTLES DRAWN AEROBIC AND ANAEROBIC  5CC  Final   Culture NO GROWTH 2 DAYS  Final   Report Status PENDING  Incomplete  Blood Culture (routine x 2)     Status: None (Preliminary result)   Collection Time: 04/10/15  7:45 PM  Result Value Ref Range Status   Specimen Description BLOOD LEFT ARM  Final   Special Requests BOTTLES DRAWN AEROBIC AND ANAEROBIC 5CC  Final   Culture NO GROWTH 2 DAYS  Final   Report Status PENDING  Incomplete  Urine culture     Status: None   Collection Time: 04/10/15  7:50 PM  Result Value Ref Range Status   Specimen Description URINE, RANDOM  Final   Special Requests NONE  Final   Culture NO GROWTH 1 DAY  Final   Report Status 04/12/2015 FINAL  Final  Culture, blood (routine x 2)     Status: None (Preliminary result)   Collection Time: 04/11/15  1:35 AM  Result Value Ref Range Status   Specimen Description BLOOD ARM LEFT  Final   Special Requests BOTTLES DRAWN AEROBIC ONLY 1CC  Final   Culture NO GROWTH 1 DAY  Final   Report Status PENDING  Incomplete  Culture, blood (routine x 2)     Status: None (Preliminary result)   Collection Time: 04/11/15  1:42 AM  Result Value Ref Range Status   Specimen Description BLOOD HAND LEFT  Final   Special Requests BOTTLES DRAWN AEROBIC ONLY 1CC  Final   Culture NO GROWTH 1 DAY  Final   Report Status PENDING  Incomplete    Impression:  1. Prosthetic valve endocarditis 2. Drug abuse 3. Possible pulmonary embolus 4. Septic emboli 5. Recent history of Pseudomonal bacteremia  Plan: 1.  I will broaden to include Pseudomonal coverage 2. Anticoagulation 3. Follow blood cultures and narrow as appropriate 4.  Will need long term IV antibiotics

## 2015-04-13 NOTE — Progress Notes (Signed)
    [facility] has been requested to perform a transesophageal echocardiogram on Melanie Crane for prosthetic valve endocarditis.  After careful review of history and examination, the risks and benefits of transesophageal echocardiogram have been explained including risks of esophageal damage, perforation (1:10,000 risk), bleeding, pharyngeal hematoma as well as other potential complications associated with conscious sedation including aspiration, arrhythmia, respiratory failure and death. Alternatives to treatment were discussed, questions were answered. Patient is willing to proceed.  TEE - Dr. Jens Somrenshaw @ 03/14/15 10AM. NPO after midnight. Meds with sips.   Senaya Dicenso, PA-C 04/13/2015 2:02 PM

## 2015-04-13 NOTE — Progress Notes (Signed)
ANTICOAGULATION CONSULT NOTE  Pharmacy Consult for heparin Indication: pulmonary embolus  No Known Allergies  Patient Measurements: Height: 5\' 4"  (162.6 cm) Weight: 161 lb 1.6 oz (73.074 kg) IBW/kg (Calculated) : 54.7 Heparin Dosing Weight: 70kg  Vital Signs: Temp: 99.5 F (37.5 C) (10/31 2236) Temp Source: Oral (10/31 2236) BP: 113/78 mmHg (10/31 2236) Pulse Rate: 97 (10/31 2236)  Labs:  Recent Labs  04/11/15 0520  04/12/15 0140 04/12/15 1009 04/12/15 1813 04/13/15 0130  HGB 10.0*  --  9.5*  --   --  8.7*  HCT 32.2*  --  30.4*  --   --  29.2*  PLT 128*  --  159  --   --  179  HEPARINUNFRC <0.10*  < > <0.10* 0.10* 0.12* 0.24*  CREATININE 0.77  --  0.71  --   --  0.67  < > = values in this interval not displayed.  Estimated Creatinine Clearance: 92.6 mL/min (by C-G formula based on Cr of 0.67).   Medical History: Past Medical History  Diagnosis Date  . Anemia   . IVDU (intravenous drug user)   . 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 Hepatitis C   . Miscarriage     Five times   Assessment: This is a 39 y.o. female with a PMHx of R sided bacterial endocarditis/tricuspid vegetations, continued IVDU (injects Opana), and anemia, who presented to the ED with complaints of gradual onset chest pain and shortness of breath 2 days. CTA showed possible left sided PE or septic emboli.   Heparin level is now increasing, though still SUBtherapeutic, pt is receiving substantial amount of heparin. Also having slow decline in hgb, monitor.   Goal of Therapy:  Heparin level 0.3-0.7 units/ml Monitor platelets by anticoagulation protocol: Yes   Plan:  -Heparin bolus 3000 units x1, increase rate to 2700 units/hr -Daily HL, CBC -Check 6 hr level    Baldemar FridayMasters, Daisee Centner M  04/13/2015 3:15 AM

## 2015-04-13 NOTE — Clinical Social Work Note (Signed)
Clinical Social Work Assessment  Patient Details  Name: Melanie Crane MRN: [id number] Date of Birth: 09-16-1975  Date of referral:  04/13/15               Reason for consult:  Substance Use/ETOH Abuse, Emotional/Coping/Adjustment to Illness (Patient uses Opana (Oxymorphone))                Permission sought to share information with:    Permission granted to share information::  No  Name::        Agency::     Relationship::     Contact Information:     Housing/Transportation Living arrangements for the past 2 months:  Single Family Home Source of Information:  Patient Patient Interpreter Needed:  None Criminal Activity/Legal Involvement Pertinent to Current Situation/Hospitalization:  No - Comment as needed Significant Relationships:  Dependent Children, Parents Lives with:  Parents, Minor Children Do you feel safe going back to the place where you live?  Yes Need for family participation in patient care:  No (Coment)  Care giving concerns:  Patient is concerned about her ability to remain sober and abstain from using when she returns home. She shares that she thinks it would be best if she could get into a residential treatment program post DC.    Social Worker assessment / plan:  CSW met with patient at bedside to complete assessment. 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 CSW was asked to speak with patient regarding her current IV drug use by unit CSW. The shares that she has been using Opana (oxymorphone) through ingestion and IVDU. The patient states that she has only been using by injection for the past month and was using the drug orally before this. She states that she currently lives with her mother and has three children (ages 16, 93, and 57). CSW explored the patient's triggers for substance use and shes states hre main trigger seems to be her feelings of  depression. She states that she begins to feel depressed when she has arguments with her children and this makes her want to use. She reports that she has not tried any coping mechanisms to deal with these triggers except using the Opana.   When asked about her use, the patient shares that she has been using approximately 3m of Opana three times a day and uses multiple times a week. She reports feeling guilty and remorseful after each use. At this time the patient uses to "feel normal" and not experience the symptoms of withdrawal. The patient has never been in treatment for her IVDU but does express interest in getting into a program. She feels that she would benefit most from a residential treatment program because she acknowledges that her current living environment plays a role in her continued substance use. CSW has provided the patient with a list of residential treatment and outpatient treatment programs that she can contact for treatment. CSW explained the difficulties and barriers of getting patient's directly into treatment from the hospital as beds are often times not available at time of DC and stressed that we cannot guarantee the patient admission to a residential treatment program directly from the hospital. Lastly, CSW explored harm reduction methods with patient including the use of local needle exchange programs and narcan rescue kit sites (lists of these sites provided.) Patient was also provided with NA meeting schedules for the [place]. CSW  will continue to meet with patient and provide support as needed during hospitalization.   Employment status:  Unemployed (Patient has not worked since her heart surgery a year ago.) Insurance information:  Self Pay (Medicaid Pending) PT Recommendations:  Not assessed at this time Information / Referral to community resources:  Residential Substance Abuse Treatment Options, Support Groups, Outpatient Substance Abuse Treatment Options, SBIRT, Other  (Comment Required) (Outpatient and residential programs including CDIOPs list. List of needle exchange programs given to patient. List of Narcan kit sites provided to patient. List of NA meeting schedules provided.)  Patient/Family's Response to care:  Patient is happy with the care she has received while here in the hospital.   Patient/Family's Understanding of and Emotional Response to Diagnosis, Current Treatment, and Prognosis:  The patient understands how her injection drug use has continued to negatively impact her health. She understands that she needs to discontinue her IVDU and appears motivated to seek treatment post DC. CSW has discussed case with covering CSW who states that patient may be requiring SNF at discharge for IV antibiotic therapy.   Emotional Assessment Appearance:  Appears stated age Attitude/Demeanor/Rapport:  Lethargic Affect (typically observed):  Accepting, Flat, Quiet Orientation:  Oriented to Self, Oriented to Place, Oriented to  Time, Oriented to Situation Alcohol / Substance use:  Illicit Drugs (Patient states she only uses Opana) Psych involvement (Current and /or in the community):  No (Comment)  Discharge Needs  Concerns to be addressed:  Substance Abuse Concerns Readmission within the last 30 days:  No Current discharge risk:  Substance Abuse, Chronically ill Barriers to Discharge:  Continued Medical Work up   [employer] MSW, [place], [place], [phone]

## 2015-04-13 NOTE — Progress Notes (Signed)
Patient Demographics  Melanie Crane, is a 39 y.o. female, DOB - December 04, 1975, JXB:[id number]  Admit date - 04/10/2015   Admitting Physician Alberteen Sam, MD  Outpatient Primary MD for the patient is No PCP Per Patient  LOS - 3   Chief Complaint  Patient presents with  . Chest Pain  . 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  Subjective:   Areta Haber today has, No headache, No chest pain, No abdominal pain - No Nausea, No new weakness tingling or numbness, No Cough - SOB.   Assessment & Plan    Principal Problem:   Pulmonary embolism (HCC) Active Problems:   IV drug abuse   Anemia   Thrombocytopenia, unspecified (HCC)   Cigarette smoker   Hepatitis C   Septic embolism (HCC)   Lung abscess (HCC)   PE (pulmonary embolism)   Embolism, pulmonary with infarction (HCC)   Endocarditis   Abscess of right lung with pneumonia (HCC)   Acute septic pulmonary embolism without acute cor pulmonale (HCC)   Sepsis (HCC)   Prosthetic valve  endocarditis (HCC)  Pulmonary embolus with pulmonary parenchymal infarction - CTA chest with evidence of Acute left-sided pulmonary embolism with pulmonary parenchymal infarction. Septic vs bland emboli considered. - Pulmonary consult appreciated, they do recommend to continue with anticoagulation, especially they are not able to rule out PE, especially the PE is large enough to fill LLL pulmonary artery and segmental branches which is too large to be from vegetation. -  bilateral lower extremity venous DopplerNegative for DVT.   Septic emboli with cavitating lung lesion /endocarditis - CTA chest with evidence of multiple pulmonary nodules and masslike lesion, this is most likely related to septic emboli and lung abscess. - History of endocarditis GBS on  July 2015, and Pseudomonas on April 2016. - ID consult appreciated, initially on IV vancomycin and Zosyn (initially on Rocephin which was transitioned back to Zosyn giving her history of Pseudomonas bacteremia ).  - Blood cultures no growth to date - 2-D echo with mobile vegetation on tricuspid valve(patient status post tricuspid replacement), CT surgery consulted, patient may eventually need a dual TVR . - Cardiology consulted , and is for TEE .  Anemia - Most likely anemia of chronic disease, hemoglobin trending down but this is most likely dilutional as she is on IV fluids, no signs of overt bleed..  IV drug abuse - Patient was counseled - HIV nonreactive  Hepatitis C -  Workup as per ID, Check Hep C VL, genotype.  Unclear history of lupus anticoagulant - Follow with Hematology As an Outpatient    Code Status: Full  Family Communication: None at bedside  Disposition Plan: Pending further work up   Procedures  None   Consults   ID PCCM CT surgery  cardiology  Medications  Scheduled Meds: . piperacillin-tazobactam (ZOSYN)  IV  3.375 g Intravenous 3 times per day  . sodium chloride  3 mL Intravenous Q12H  .  vancomycin  1,250 mg Intravenous Q8H   Continuous Infusions: . sodium chloride 75 mL/hr at 04/12/15 0815  . sodium chloride    . heparin 2,700 Units/hr (04/13/15 1425)   PRN Meds:.HYDROmorphone (DILAUDID) injection, oxyCODONE  DVT Prophylaxis   Heparin GTT  Lab Results  Component Value Date   PLT 179 04/13/2015    Antibiotics  Anti-infectives    Start     Dose/Rate Route Frequency Ordered Stop   04/13/15 1400  piperacillin-tazobactam (ZOSYN) IVPB 3.375 g     3.375 g 12.5 mL/hr over 240 Minutes Intravenous 3 times per day 04/13/15 1144     04/13/15 0200  vancomycin (VANCOCIN) 1,250 mg in sodium chloride 0.9 % 250 mL IVPB     1,250 mg 166.7 mL/hr over 90 Minutes Intravenous Every 8 hours 04/12/15 2131     04/11/15 1030  cefTRIAXone (ROCEPHIN) 2 g in dextrose 5 % 50 mL IVPB  Status:  Discontinued     2 g 100 mL/hr over 30 Minutes Intravenous Every 24 hours 04/11/15 1023 04/13/15 1112   04/11/15 0400  piperacillin-tazobactam (ZOSYN) IVPB 3.375 g  Status:  Discontinued     3.375 g 12.5 mL/hr over 240 Minutes Intravenous Every 8 hours 04/10/15 1925 04/11/15 1023   04/11/15 0400  vancomycin (VANCOCIN) IVPB 750 mg/150 ml premix  Status:  Discontinued     750 mg 150 mL/hr over 60 Minutes Intravenous Every 8 hours 04/10/15 1925 04/12/15 2131   04/10/15 1930  vancomycin (VANCOCIN) 1,500 mg in sodium chloride 0.9 % 500 mL IVPB     1,500 mg 250 mL/hr over 120 Minutes Intravenous  Once 04/10/15 1925 04/10/15 2235   04/10/15 1915  piperacillin-tazobactam (ZOSYN) IVPB 3.375 g     3.375 g 100 mL/hr over 30 Minutes Intravenous  Once 04/10/15 1910 04/10/15 2107   04/10/15 1915  vancomycin (VANCOCIN) IVPB 1000 mg/200 mL premix  Status:  Discontinued     1,000 mg 200 mL/hr over 60 Minutes Intravenous  Once 04/10/15 1910 04/10/15 1925          Objective:   Filed Vitals:   04/12/15 1332 04/12/15 2236 04/13/15 0414 04/13/15 1300  BP: 124/82 113/78 118/96 113/84  Pulse: 96 97 91 93    Temp: 100 F (37.8 C) 99.5 F (37.5 C) 98.7 F (37.1 C)   TempSrc: Oral Oral Oral   Resp: 20 22 34 20  Height:      Weight:      SpO2: 100% 97% 97% 97%    Wt Readings from Last 3 Encounters:  04/11/15 73.074 kg (161 lb 1.6 oz)  12/28/13 70.2 kg (154 lb 12.2 oz)     Intake/Output Summary (Last 24 hours) at 04/13/15 1614 Last data filed at 04/13/15 1230  Gross per 24 hour  Intake 4618.75 ml  Output    800 ml  Net 3818.75 ml     Physical Exam  Awake Alert, Oriented X 3,  [place].AT,PERRAL Supple Neck,No JVD, Symmetrical Chest wall movement,  diminished air movement bilaterally,  No Gallops,Rubs or new Murmurs, No Parasternal Heave +ve B.Sounds, Abd Soft, No tenderness, No organomegaly appriciated, No rebound - guarding or rigidity. No Cyanosis, Clubbing or edema, No new Rash or bruise     Data Review   Micro Results Recent Results (from the past 240 hour(s))  Blood Culture (routine x 2)     Status: None (Preliminary result)   Collection Time: 04/10/15  7:30 PM  Result Value Ref Range Status   Specimen Description BLOOD RIGHT ARM  Final   Special Requests BOTTLES DRAWN AEROBIC AND ANAEROBIC 5CC  Final   Culture NO GROWTH 3 DAYS  Final   Report Status PENDING  Incomplete  Blood Culture (routine x 2)     Status: None (Preliminary result)   Collection Time: 04/10/15  7:45 PM  Result Value Ref Range Status   Specimen Description BLOOD LEFT ARM  Final   Special Requests BOTTLES DRAWN AEROBIC AND ANAEROBIC 5CC  Final   Culture NO GROWTH 3 DAYS  Final   Report Status PENDING  Incomplete  Urine culture     Status: None   Collection Time: 04/10/15  7:50 PM  Result Value Ref Range Status   Specimen Description URINE, RANDOM  Final   Special Requests NONE  Final   Culture NO GROWTH 1 DAY  Final   Report Status 04/12/2015 FINAL  Final  Culture, blood (routine x 2)     Status: None (Preliminary result)   Collection Time: 04/11/15  1:35 AM  Result Value Ref Range Status    Specimen Description BLOOD ARM LEFT  Final   Special Requests BOTTLES DRAWN AEROBIC ONLY 1CC  Final   Culture NO GROWTH 2 DAYS  Final   Report Status PENDING  Incomplete  Culture, blood (routine x 2)     Status: None (Preliminary result)   Collection Time: 04/11/15  1:42 AM  Result Value Ref Range Status   Specimen Description BLOOD HAND LEFT  Final   Special Requests BOTTLES DRAWN AEROBIC ONLY 1CC  Final   Culture NO GROWTH 2 DAYS  Final   Report Status PENDING  Incomplete    Radiology Reports Dg Chest 2 View  04/10/2015  CLINICAL DATA:  Pain upper right breast towards medial border. 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 Chest pain and shortness breath for 2 days. EXAM: CHEST  2 VIEW COMPARISON:  Chest x-ray dated 12/28/2013. FINDINGS: Heart size is upper normal, not significantly changed compared to the previous exam. Overall cardiomediastinal silhouette is stable in size and configuration. Ill-defined opacities are seen within the right mid lung region and at the left lung base. A focal rounded nodular density overlying a mid thoracic vertebral body on the lateral view may be related to the right mid lung opacity. No pleural effusions seen. No pneumothorax. No acute osseous abnormality. IMPRESSION: 1. Ill-defined opacity within the right mid lung region with an additional smaller ill-defined opacity at the left lung base. This could represent asymmetric edema or pneumonia. 2. Focal nodular density overlying a mid thoracic vertebral body on the lateral view, measuring 1.3 cm greatest dimension, which may be related to the right mid lung opacity. A neoplastic process cannot be excluded. Recommend chest CT for further characterization. Electronically Signed   By: Bary Richard M.D.   On: 04/10/2015 18:34   Ct Angio Chest Pe W/cm &/or Wo Cm  04/10/2015  CLINICAL DATA:  Right-sided chest pain for 2 days, shortness of breath, cough, chest pain,  IV drug abuse, acute bacterial endocarditis EXAM: CT  ANGIOGRAPHY CHEST WITH CONTRAST TECHNIQUE: Multidetector CT imaging of the chest was performed using the standard protocol during bolus administration of intravenous contrast. Multiplanar CT image reconstructions and MIPs were obtained to evaluate the vascular anatomy. CONTRAST:  80mL OMNIPAQUE IOHEXOL 350 MG/ML SOLN COMPARISON:  04/10/2015 chest radiograph FINDINGS: Mediastinum/Nodes: There is filling defect in the left lower lobe pulmonary artery at the hilum extending into numerous branches. Branches supplying the lateral left lower lobe appear totally occluded as this vessel extends into the area of consolidation in the anterior lateral left lower lobe described above. Left upper lobe pulmonary arteries are clear. Right pulmonary arteries are clear. There are numerous mediastinal lymph nodes. Sub carinal adenopathy measures 12 mm and right hilar adenopathy measures 12 mm. Lungs/Pleura: Posteriorly in the right lower lobe superior segment, there is a 5.2. x 3.8 x 4.5 cm well-defined rounded pleural-based abnormality that consist of consolidation mixed with areas of air attenuation. The walls of this lesion are mildly irregular and there is mild surrounding inflammatory change. There are multiple irregular right upper lobe pulmonary nodules, seen on series 5, image number 47, 48, and 59. The largest of these is seen on image 59 and measures 10 mm. There is consolidation in the anterior inferior medial right middle lobe which appears most consistent with atelectasis. In the left upper lobe there is an irregular 7 mm pulmonary nodule on image number 63. There is a 6 mm nodule in the left upper lobe on image number 45. There is consolidation in the anterior lateral aspect of the left lower lobe at the level of the diaphragm. There is a small right pleural effusion. Upper abdomen: The spleen is only partially imaged but appears possibly enlarged. The liver may also be enlarged. Musculoskeletal: No acute or focally  suspicious findings. Review of the MIP images confirms the above findings. IMPRESSION: 1. Acute left-sided pulmonary embolism with pulmonary parenchymal infarction. Septic vs bland emboli considered. 2. Large complex masslike structure right lower lobe. Malignancy is not excluded. However, cavitating pneumonia or pulmonary abscess considered more likely, possibly from septic embolic seeding vs aspiration. 3. Multiple bilateral pulmonary nodules. Differential diagnostic possibilities include metastatic disease or septic emboli. 4. Several enlarged mediastinal lymph nodes could be reactive. Metastatic disease not excluded. Critical Value/emergent results were called by telephone at the time of interpretation on 04/10/2015 at 9:17 pm to Dr. Patria Mane , who verbally acknowledged these results. Electronically Signed   By: Esperanza Heir M.D.   On: 04/10/2015 21:18     CBC  Recent Labs Lab 04/10/15 1819 04/10/15 1935 04/11/15 0520 04/12/15 0140 04/13/15 0130  WBC 15.2*  --  13.9* 12.9* 9.1  HGB 10.6*  --  10.0* 9.5* 8.7*  HCT 34.2*  --  32.2* 30.4* 29.2*  PLT 147*  --  128* 159 179  MCV 75.3*  --  76.3* 75.6* 76.2*  MCH 23.3*  --  23.7* 23.6* 22.7*  MCHC 31.0  --  31.1 31.3 29.8*  RDW 18.2*  --  18.5* 18.2* 18.0*  LYMPHSABS  --  1.9  --   --   --   MONOABS  --  1.4*  --   --   --   EOSABS  --  0.0  --   --   --   BASOSABS  --  0.0  --   --   --     Chemistries   Recent Labs Lab 04/10/15 1819 04/10/15 1935  04/11/15 0520 04/12/15 0140 04/13/15 0130  NA 131*  --  134* 137 137  K 4.0  --  4.1 3.7 3.7  CL 97*  --  104 107 108  CO2 24  --  21* 21* 19*  GLUCOSE 141*  --  111* 114* 108*  BUN 9  --  8 9 10   CREATININE 0.85  --  0.77 0.71 0.67  CALCIUM 8.4*  --  7.9* 8.1* 8.1*  AST  --  33  --   --   --   ALT  --  39  --   --   --   ALKPHOS  --  99  --   --   --   BILITOT  --  1.2  --   --   --     ------------------------------------------------------------------------------------------------------------------ estimated creatinine clearance is 92.6 mL/min (by C-G formula based on Cr of 0.67). ------------------------------------------------------------------------------------------------------------------ No results for input(s): HGBA1C in the last 72 hours. ------------------------------------------------------------------------------------------------------------------ No results for input(s): CHOL, HDL, LDLCALC, TRIG, CHOLHDL, LDLDIRECT in the last 72 hours. ------------------------------------------------------------------------------------------------------------------ No results for input(s): TSH, T4TOTAL, T3FREE, THYROIDAB in the last 72 hours.  Invalid input(s): FREET3 ------------------------------------------------------------------------------------------------------------------  Recent Labs  04/11/15 0142  RETICCTPCT 1.2    Coagulation profile No results for input(s): INR, PROTIME in the last 168 hours.  No results for input(s): DDIMER in the last 72 hours.  Cardiac Enzymes No results for input(s): CKMB, TROPONINI, MYOGLOBIN in the last 168 hours.  Invalid input(s): CK ------------------------------------------------------------------------------------------------------------------ Invalid input(s): POCBNP     Time Spent in minutes   30 minutes   Trevaun Rendleman M.D on 04/13/2015 at 4:14 PM  Between 7am to 7pm - Pager - [phone]  After 7pm go to [email or web address] - password [email or web address]  [facility]   Office  [phone]

## 2015-04-13 NOTE — Progress Notes (Addendum)
ANTICOAGULATION & ANTIBIOTIC CONSULT NOTE  Pharmacy Consult for heparin; Add Zosyn Indication: pulmonary embolus; Broaden antibiotics for Pseudomonal Coverage in setting of Endocarditis  No Known Allergies Patient Measurements: Height: 5\' 4"  (162.6 cm) Weight: 161 lb 1.6 oz (73.074 kg) IBW/kg (Calculated) : 54.7 Heparin Dosing Weight: 70kg Vital Signs: Temp: 98.7 F (37.1 C) (11/01 0414) Temp Source: Oral (11/01 0414) BP: 118/96 mmHg (11/01 0414) Pulse Rate: 91 (11/01 0414) Labs:  Recent Labs  04/11/15 0520  04/12/15 0140  04/12/15 1813 04/13/15 0130 04/13/15 0904  HGB 10.0*  --  9.5*  --   --  8.7*  --   HCT 32.2*  --  30.4*  --   --  29.2*  --   PLT 128*  --  159  --   --  179  --   HEPARINUNFRC <0.10*  < > <0.10*  < > 0.12* 0.24* 0.46  CREATININE 0.77  --  0.71  --   --  0.67  --   < > = values in this interval not displayed.  Estimated Creatinine Clearance: 92.6 mL/min (by C-G formula based on Cr of 0.67).  Assessment: This is a 39 y.o. female with a PMHx of R sided bacterial endocarditis/tricuspid vegetations, continued IVDU (injects Opana), and anemia, who presented to the ED with complaints of gradual onset chest pain and shortness of breath 2 days. CTA showed possible left sided PE or septic emboli.   Heparin level is now therapeutic on 2700 units/hr, pt is receiving substantial amount of heparin at 38 units/kg/hr. Hgb is down this AM 8.7 (slow trend down- MD thinks this is hemodilution). Plts are rising at 179. LE Dopplers are negative for DVT. Discussed high rate with Dr. Randol KernElgergawy- wants to continue for now and follow-up plans for possible valve replacement.    ID: Dr. Luciana Axeomer requested pharmacy to dose Zosyn as wishes to broaden coverage for pseudomonas with hsitory of this in the past. Remains on vancomycin for endocarditis. Septic emboli to lungs. WBC wnl. Tmax 100.   Abx: Vanc 10/29>>  Zosyn 10/29>>10/30; 11/1 >> CTX 10/30 >>11/1 (last dose at  10AM)  Microbiology: 10/29 Blood x 2 ngtd  10/29 Urine: ngF 10/30 Blood x2: ngtd   Goal of Therapy:  Heparin level 0.3-0.7 units/ml Monitor platelets by anticoagulation protocol: Yes   Plan:  Continue heparin at 2700 units/hr.  Recheck confirmatory level in 6 hours.  Daily heparin level and CBC while on therapy.  Restart Zosyn 3.375 g IV every 8 hours - 4 hr infusion.  Continue vancomycin 1250 mg IV every 8 hours.  Monitor renal function, clinical status, and culture results.  Link SnufferJessica Loel Betancur, PharmD, BCPS Clinical Pharmacist [phone]  04/13/2015 10:37 AM

## 2015-04-13 NOTE — Progress Notes (Signed)
ANTICOAGULATION CONSULT NOTE  Pharmacy Consult for heparin Indication: pulmonary embolus   No Known Allergies Patient Measurements: Height: 5\' 4"  (162.6 cm) Weight: 161 lb 1.6 oz (73.074 kg) IBW/kg (Calculated) : 54.7 Heparin Dosing Weight: 70kg Vital Signs: BP: 113/84 mmHg (11/01 1300) Pulse Rate: 93 (11/01 1300) Labs:  Recent Labs  04/11/15 0520  04/12/15 0140  04/13/15 0130 04/13/15 0904 04/13/15 1712  HGB 10.0*  --  9.5*  --  8.7*  --   --   HCT 32.2*  --  30.4*  --  29.2*  --   --   PLT 128*  --  159  --  179  --   --   HEPARINUNFRC <0.10*  < > <0.10*  < > 0.24* 0.46 0.35  CREATININE 0.77  --  0.71  --  0.67  --   --   < > = values in this interval not displayed.  Estimated Creatinine Clearance: 92.6 mL/min (by C-G formula based on Cr of 0.67).  Assessment: This is a 39 y.o. female with a PMHx of R sided bacterial endocarditis/tricuspid vegetations, continued IVDU (injects Opana), and anemia, who presented to the ED with complaints of gradual onset chest pain and shortness of breath 2 days. CTA showed possible left sided PE or septic emboli.   Heparin level remains therapeutic on 2700 units/hr but trending down. Nurse reports no issues with infusion or bleeding.   Goal of Therapy:  Heparin level 0.3-0.7 units/ml Monitor platelets by anticoagulation protocol: Yes   Plan:  Increase heparin to 2800 units/hr Daily HL/CBC Monitor s/sx of bleeding  Arlean Hoppingorey M. Newman PiesBall, PharmD Clinical Pharmacist Pager [phone] 04/13/2015 6:05 PM

## 2015-04-14 ENCOUNTER — Encounter (HOSPITAL_COMMUNITY)
Admission: EM | Disposition: A | Discharge status: Skilled Nursing Facility | Payer: Self-pay | Source: Home / Self Care | Attending: Internal Medicine

## 2015-04-14 ENCOUNTER — Inpatient Hospital Stay (HOSPITAL_COMMUNITY): Payer: Self-pay

## 2015-04-14 ENCOUNTER — Encounter (HOSPITAL_COMMUNITY): Payer: Self-pay | Admitting: *Deleted

## 2015-04-14 DIAGNOSIS — I38 Endocarditis, valve unspecified: Secondary | ICD-10-CM

## 2015-04-14 DIAGNOSIS — T826XXD Infection and inflammatory reaction due to cardiac valve prosthesis, subsequent encounter: Secondary | ICD-10-CM

## 2015-04-14 DIAGNOSIS — Z720 Tobacco use: Secondary | ICD-10-CM

## 2015-04-14 DIAGNOSIS — T826XXA Infection and inflammatory reaction due to cardiac valve prosthesis, initial encounter: Principal | ICD-10-CM

## 2015-04-14 DIAGNOSIS — I360 Nonrheumatic tricuspid (valve) stenosis: Secondary | ICD-10-CM

## 2015-04-14 HISTORY — PX: TEE WITHOUT CARDIOVERSION: SHX5443

## 2015-04-14 LAB — CBC
HEMATOCRIT: 27.1 % — AB (ref 36.0–46.0)
Hemoglobin: 8.3 g/dL — ABNORMAL LOW (ref 12.0–15.0)
MCH: 22.9 pg — AB (ref 26.0–34.0)
MCHC: 30.6 g/dL (ref 30.0–36.0)
MCV: 74.9 fL — AB (ref 78.0–100.0)
PLATELETS: 213 10*3/uL (ref 150–400)
RBC: 3.62 MIL/uL — AB (ref 3.87–5.11)
RDW: 17.9 % — ABNORMAL HIGH (ref 11.5–15.5)
WBC: 6.5 10*3/uL (ref 4.0–10.5)

## 2015-04-14 LAB — HEPATITIS C GENOTYPE: HCV GENOTYPE: 3

## 2015-04-14 LAB — HEPARIN LEVEL (UNFRACTIONATED): HEPARIN UNFRACTIONATED: 0.39 [IU]/mL (ref 0.30–0.70)

## 2015-04-14 SURGERY — ECHOCARDIOGRAM, TRANSESOPHAGEAL
Anesthesia: Moderate Sedation

## 2015-04-14 MED ORDER — FENTANYL CITRATE (PF) 100 MCG/2ML IJ SOLN
INTRAMUSCULAR | Status: AC
  Filled 2015-04-14: qty 2

## 2015-04-14 MED ORDER — DIPHENHYDRAMINE HCL 50 MG/ML IJ SOLN
INTRAMUSCULAR | Status: AC
Start: 2015-04-14 — End: 2015-04-14
  Filled 2015-04-14: qty 1

## 2015-04-14 MED ORDER — BUTAMBEN-TETRACAINE-BENZOCAINE 2-2-14 % EX AERO
INHALATION_SPRAY | CUTANEOUS | Status: DC | PRN
  Administered 2015-04-14: 2 via TOPICAL

## 2015-04-14 MED ORDER — MIDAZOLAM HCL 5 MG/ML IJ SOLN
INTRAMUSCULAR | Status: AC
  Filled 2015-04-14: qty 2

## 2015-04-14 MED ORDER — FENTANYL CITRATE (PF) 100 MCG/2ML IJ SOLN
INTRAMUSCULAR | Status: DC | PRN
  Administered 2015-04-14 (×2): 25 ug via INTRAVENOUS

## 2015-04-14 MED ORDER — MIDAZOLAM HCL 10 MG/2ML IJ SOLN
INTRAMUSCULAR | Status: DC | PRN
  Administered 2015-04-14 (×2): 2 mg via INTRAVENOUS

## 2015-04-14 NOTE — CV Procedure (Signed)
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 Olga MillersBrian Graceyn Fodor

## 2015-04-14 NOTE — Interval H&P Note (Signed)
History and Physical Interval Note:  04/14/2015 10:13 AM  Melanie Crane  has presented today for surgery, with the diagnosis of STROKE  The various methods of treatment have been discussed with the patient and family. After consideration of risks, benefits and other options for treatment, the patient has consented to  Procedure(s): TRANSESOPHAGEAL ECHOCARDIOGRAM (TEE) (N/A) as a surgical intervention .  The patient's history has been reviewed, patient examined, no change in status, stable for surgery.  I have reviewed the patient's chart and labs.  Questions were answered to the patient's satisfaction.     Olga MillersBrian Dellis Voght

## 2015-04-14 NOTE — Progress Notes (Signed)
Day of Surgery Procedure(s) (LRB): TRANSESOPHAGEAL ECHOCARDIOGRAM (TEE) (N/A) Subjective: Still with pleuritic chest pain Just back form TEE  Objective: Vital signs in last 24 hours: Temp:  [98.4 F (36.9 C)-98.8 F (37.1 C)] 98.8 F (37.1 C) (11/02 1043) Pulse Rate:  [84-95] 84 (11/02 1100) Cardiac Rhythm:  [-] Normal sinus rhythm (11/02 0700) Resp:  [16-31] 20 (11/02 1100) BP: (110-141)/(75-105) 112/75 mmHg (11/02 1100) SpO2:  [92 %-97 %] 95 % (11/02 1100) Weight:  [161 lb (73.029 kg)] 161 lb (73.029 kg) (11/02 0924)  Hemodynamic parameters for last 24 hours:    Intake/Output from previous day: 11/01 0701 - 11/02 0700 In: 360 [P.O.:360] Out: 2000 [Urine:2000] Intake/Output this shift: Total I/O In: 0  Out: 500 [Urine:500]  General appearance: cooperative and mild distress Neurologic: intact Heart: regular rate and rhythm Extremities: no edema  Lab Results:  Recent Labs  04/13/15 0130 04/14/15 0426  WBC 9.1 6.5  HGB 8.7* 8.3*  HCT 29.2* 27.1*  PLT 179 213   BMET:  Recent Labs  04/12/15 0140 04/13/15 0130  NA 137 137  K 3.7 3.7  CL 107 108  CO2 21* 19*  GLUCOSE 114* 108*  BUN 9 10  CREATININE 0.71 0.67  CALCIUM 8.1* 8.1*    PT/INR: No results for input(s): LABPROT, INR in the last 72 hours. ABG No results found for: PHART, HCO3, TCO2, ACIDBASEDEF, O2SAT CBG (last 3)  No results for input(s): GLUCAP in the last 72 hours.  Assessment/Plan: S/P Procedure(s) (LRB): TRANSESOPHAGEAL ECHOCARDIOGRAM (TEE) (N/A) -  Had TEE earlier today- vegetation seen on TV but no TR. Other valves OK  Awaiting final result  She is afebrile and WBC is normal. Blood cultures negative so far.  Would continue with IV antibiotics. Hopefully a culture will be positive and help guide antimicrobial therapy.  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  Give that the valve is competent, I think the best course is likely to be to have her complete an inpatient drug rehab while completing antibiotics. We could then consider redo surgery.   LOS: 4 days    Loreli SlotSteven C Kenai Fluegel 04/14/2015

## 2015-04-14 NOTE — Progress Notes (Signed)
  Echocardiogram Echocardiogram Transesophageal has been performed.  Melanie Crane, Melanie Crane M 04/14/2015, 11:06 AM

## 2015-04-14 NOTE — Consult Note (Signed)
CARDIOLOGY CONSULT NOTE   Patient ID: Melanie Crane MRN: [id number] DOB/AGE: 39-Dec-1977 39 y.o.  Admit date: 04/10/2015  Primary Physician   No PCP Per Patient Primary Cardiologist   Dr Elease Hashimoto - saw in 2015 in-hospital CardioThoracic Surgery: Dr Herbert Moors in [place], [place]. [phone] Reason for Consultation   Abnormal TEE  CZY:SAYTK Melanie Crane is a 39 y.o. 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 She was seen by Dr Elease Hashimoto during that admission as well as Dr Cornelius Moras with [facility]. The plan at that time was for IV ABX, lifestyle modifications and following the patient clinically. GROUP B STREP(S.AGALACTIAE) was isolated from blood cultures. She was not felt to be a candidate for PICC line and OP ABX, and received oritavancin (3 week half-life) prior to d/c.   She was admitted 04/10/2015 with fever and chest pain. Currently living in [place] and has been injecting oxymorphone. At some point, while in [place], she had a TVR. Her CTA chest was significantly abnormal and her TEE showed a vegetation on the artificial valve. She is on IV ABX, ID and IM managing. [facility] has seen and no urgent need for surgery. Cardiology asked to see.   Pt initially did well after d/c last July. She went to [place] and completed 3 weeks of antibiotics there. She was clean. She had valve surgery in October 2015 at [facility] in [place] by Dr Narda Amber. She remained clean and was doing well until 3 weeks ago. She had an argument with her children and started using again. She had no symptoms of illness until 2 days prior to admission, when she had fevers and chills.  Past Medical History  Diagnosis Date  . Anemia   . IVDU (intravenous drug user)   . 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 Hepatitis C   . Miscarriage     Five times     Past Surgical History   Procedure Laterality Date  . Bunionectomy    . Tonsillectomy    . 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 piperacillin-tazobactam (ZOSYN)  IV  3.375 g Intravenous 3 times per day  . sodium chloride  3 mL Intravenous Q12H  . vancomycin  1,250 mg Intravenous Q8H   . sodium chloride 75 mL/hr at 04/12/15 0815  . heparin 2,800 Units/hr (04/14/15 1255)   HYDROmorphone (DILAUDID) injection, oxyCODONE  Prior to Admission medications   None    Social History   Social History  . Marital Status: Divorced    Spouse Name: N/A  . Number of Children: N/A  . Years of Education: N/A   Occupational History  . Unemployed    Social History Main Topics  . Smoking status: Current Every Day Smoker    Types: Cigarettes  . Smokeless tobacco: Never Used     Comment: 1 1/2 ppd  . Alcohol Use: No  . Drug Use: Yes    Special: IV     Comment: injectable opana in L forarm for 2-3 month  . Sexual Activity: Not on file   Other Topics Concern  . Not on file   Social History Narrative   Was living in [place], [place]. In [place] visiting family.  Family Status  Relation Status Death Age  . Mother Alive    Family History  Problem Relation Age of Onset  . Hypertension Father   . Kidney failure Father   . Hyperlipidemia Mother   . CAD Mother   . Deafness Mother      ROS:  Full 14 point review of systems complete and found to be negative unless listed above.  Physical Exam: Blood pressure 107/84, pulse 79, temperature 99.4 F (37.4 C), temperature source Oral, resp. rate 18, height  (1.626 m), weight 161 lb (73.029 kg), last menstrual period 03/27/2015, SpO2 97 %.  General: Well developed, well nourished, female in moderate discomfort Head: Eyes PERRLA, No xanthomas.   Normocephalic and atraumatic, oropharynx without edema or exudate. Dentition: good Lungs: rales  bilaterally Heart: HRRR S1 S2, no rub, + S4 gallop, + murmur. pulses are 2+ all 4 extrem.   Neck: No carotid bruits. No lymphadenopathy.  JVD minimal elevation. Abdomen: Bowel sounds present, abdomen soft and non-tender without masses or hernias noted. Msk:  No spine or cva tenderness. No weakness, no joint deformities or effusions. Extremities: No clubbing or cyanosis. No edema.  Neuro: Alert and oriented X 3. No focal deficits noted. Psych:  Good affect, responds appropriately Skin: No rashes or lesions noted.  Labs: Lab Results  Component Value Date   WBC 6.5 04/14/2015   HGB 8.3* 04/14/2015   HCT 27.1* 04/14/2015   MCV 74.9* 04/14/2015   PLT 213 04/14/2015   No results for input(s): INR in the last 72 hours.  Recent Labs Lab 04/10/15 1935  04/13/15 0130  NA  --   < > 137  K  --   < > 3.7  CL  --   < > 108  CO2  --   < > 19*  BUN  --   < > 10  CREATININE  --   < > 0.67  CALCIUM  --   < > 8.1*  PROT 7.1  --   --   BILITOT 1.2  --   --   ALKPHOS 99  --   --   ALT 39  --   --   AST 33  --   --   GLUCOSE  --   < > 108*  ALBUMIN 2.7*  --   --   < > = values in this interval not displayed.  Echo: 04/11/2015 - Left ventricle: The cavity size was normal. There was moderate concentric hypertrophy. Systolic function was normal. The estimated ejection fraction was in the range of 55% to 60%. Wall motion was normal; there were no regional wall motion abnormalities. There was an increased relative contribution of atrial contraction to ventricular filling. Doppler parameters are consistent with abnormal left ventricular relaxation (grade 1 diastolic dysfunction). - Right atrium: The atrium was moderately to severely dilated. - Atrial septum: There was a probable, small patent foramen ovale. - Tricuspid valve: S/P Trisupid valve replacement is present. There appears to be a shaggy mobile density on the ventricular side of the valve consistent with  vegetation. This is best seen in the apical 4 chamber view.  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  ECG:  04/10/2015 ST, no acute changes Vent. rate 116 BPM PR interval 122 ms QRS duration 78 ms QT/QTc 332/461 ms P-R-T axes 60  93 49  Radiology:   Dg Chest 2 View 04/10/2015  CLINICAL DATA:  Pain upper right breast towards medial border. 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 Chest pain and shortness breath for 2 days. EXAM: CHEST  2 VIEW COMPARISON:  Chest x-ray dated 12/28/2013. FINDINGS: Heart size is upper normal, not significantly changed compared to the previous exam. Overall cardiomediastinal silhouette is stable in size and configuration. Ill-defined opacities are seen within the right mid lung region and at the left lung base. A focal rounded nodular density overlying a mid thoracic vertebral body on the lateral view may be related to the right mid lung opacity. No pleural effusions seen. No pneumothorax. No acute osseous abnormality. IMPRESSION: 1. Ill-defined opacity within the right mid lung region with an additional smaller ill-defined opacity at the left lung base. This could represent asymmetric edema or pneumonia. 2. Focal nodular density overlying a mid thoracic vertebral body on the lateral view, measuring 1.3 cm greatest dimension, which may be related to the right mid lung opacity. A neoplastic process cannot be excluded. Recommend chest CT for further characterization. Electronically Signed   By: Bary Richard M.D.   On: 04/10/2015 18:34   Ct Angio Chest Pe W/cm &/or Wo Cm 04/10/2015  CLINICAL DATA:  Right-sided chest pain for 2 days, shortness of breath, cough, chest pain, IV drug abuse, acute bacterial endocarditis EXAM: CT ANGIOGRAPHY CHEST WITH CONTRAST TECHNIQUE: Multidetector CT imaging of the  chest was performed using the standard protocol during bolus administration of intravenous contrast. Multiplanar CT image reconstructions and MIPs were obtained to evaluate the vascular anatomy. CONTRAST:  80mL OMNIPAQUE IOHEXOL 350 MG/ML SOLN COMPARISON:  04/10/2015 chest radiograph FINDINGS: Mediastinum/Nodes: There is filling defect in the left lower lobe pulmonary artery at the hilum extending into numerous branches. Branches supplying the lateral left lower lobe appear totally occluded as this vessel extends into the area of consolidation in the anterior lateral left lower lobe described above. Left upper lobe pulmonary arteries are clear. Right pulmonary arteries are clear. There are numerous mediastinal lymph nodes. Sub carinal adenopathy measures 12 mm and right hilar adenopathy measures 12 mm. Lungs/Pleura: Posteriorly in the right lower lobe superior segment, there is a 5.2. x 3.8 x 4.5 cm well-defined rounded pleural-based abnormality that consist of consolidation mixed with areas of air attenuation. The walls of this lesion are mildly irregular and there is mild surrounding inflammatory change. There are multiple irregular right upper lobe pulmonary nodules, seen on series 5, image number 47, 48, and 59. The largest of these is seen on image 59 and measures 10 mm. There is consolidation in the anterior inferior medial right middle lobe which appears most consistent with atelectasis. In the left upper lobe there is an irregular 7 mm pulmonary nodule on image number 63. There is a 6 mm nodule in the left upper lobe on image number 45. There is consolidation in the anterior lateral aspect of the left lower lobe at the level of the diaphragm. There is a small right pleural effusion. Upper abdomen: The spleen is only partially imaged but appears possibly enlarged. The liver may also be enlarged. Musculoskeletal: No acute or focally suspicious findings. Review of the MIP images confirms the above findings.  IMPRESSION: 1. Acute left-sided pulmonary embolism with pulmonary parenchymal infarction. Septic vs bland emboli considered. 2. Large complex masslike structure right lower lobe. Malignancy is not excluded. However, cavitating pneumonia or pulmonary abscess considered more likely, possibly from septic embolic seeding vs aspiration. 3.  Multiple bilateral pulmonary nodules. Differential diagnostic possibilities include metastatic disease or septic emboli. 4. Several enlarged mediastinal lymph nodes could be reactive. Metastatic disease not excluded. Critical Value/emergent results were called by telephone at the time of interpretation on 04/10/2015 at 9:17 pm to Dr. Patria Maneampos , who verbally acknowledged these results. Electronically Signed   By: Esperanza Heiraymond  Rubner M.D.   On: 04/10/2015 21:18   ASSESSMENT AND PLAN:   The patient was seen today by Dr Anne FuSkains, the patient evaluated and the data reviewed.     Prosthetic valve endocarditis (HCC) - blood cx negative so far - mgt per IM/ID - Dr Dorris FetchHendrickson w/ [facility] has seen, no surgery for now. - TEE preliminary results are above. - No further cardiac evaluation needed. - Call if we can be of further assistance  Otherwise, per IM/ID, TCTS Principal Problem:   Acute septic pulmonary embolism without acute cor pulmonale (HCC) Active Problems:   IV drug abuse   Anemia   Thrombocytopenia, unspecified (HCC)   Cigarette smoker   Hepatitis C   Septic embolism (HCC)   Embolism, pulmonary with infarction (HCC)   Abscess of right lung with pneumonia (HCC)   Sepsis (HCC)   Prosthetic valve endocarditis (HCC)     SignedTheodore Demark: Barrett, Rhonda, PA-C 04/14/2015 4:02 PM Beeper [phone]  Personally seen and examined. Agree with above. Endocarditis of TV valve replacement, IVDU. No surgery per Dr. Sunday CornHendrickson's note No further cardiology procedures needed. TEE done. Encourage cessation of IVDA.  Will sign off.   Donato SchultzSKAINS, MARK, MD

## 2015-04-14 NOTE — Progress Notes (Signed)
ANTICOAGULATION CONSULT NOTE   Pharmacy Consult for heparin Indication: pulmonary embolus   No Known Allergies Patient Measurements: Height: 5\' 4"  (162.6 cm) Weight: 161 lb 1.6 oz (73.074 kg) IBW/kg (Calculated) : 54.7 Heparin Dosing Weight: 70kg Vital Signs: Temp: 98.6 F (37 C) (11/02 0451) Temp Source: Oral (11/02 0451) BP: 110/80 mmHg (11/02 0451) Pulse Rate: 87 (11/02 0451) Labs:  Recent Labs  04/12/15 0140  04/13/15 0130 04/13/15 0904 04/13/15 1712 04/14/15 0426  HGB 9.5*  --  8.7*  --   --  8.3*  HCT 30.4*  --  29.2*  --   --  27.1*  PLT 159  --  179  --   --  213  HEPARINUNFRC <0.10*  < > 0.24* 0.46 0.35 0.39  CREATININE 0.71  --  0.67  --   --   --   < > = values in this interval not displayed.  Estimated Creatinine Clearance: 92.6 mL/min (by C-G formula based on Cr of 0.67).  Assessment: This is a 39 y.o. female with a PMHx of R sided bacterial endocarditis/tricuspid vegetations, continued IVDU (injects Opana), and anemia, who presented to the ED with complaints of gradual onset chest pain and shortness of breath 2 days. CTA showed possible left sided PE or septic emboli.   Heparin level therapeutic on 2800 units/hr. No bleeding noted.   Goal of Therapy:  Heparin level 0.3-0.7 units/ml Monitor platelets by anticoagulation protocol: Yes   Plan:  continue heparin at 2800 units/hr Daily HL/CBC Monitor s/sx of bleeding  Christoper Fabianaron Julie Nay, PharmD, BCPS Clinical pharmacist, pager [phone] 04/14/2015 5:32 AM

## 2015-04-14 NOTE — Care Management Note (Signed)
Case Management Note Donn PieriniKristi Hena Ewalt RN, BSN Unit 2W-Case Manager [phone]  Patient Details  Name: Melanie RavelingJoann Theresa Eckerson MRN: [id number] Date of Birth: 01/18/1976  Subjective/Objective:    Pt admitted with PE and endocarditis, pt with known hx of IV drug use                Action/Plan: PTA [place] lived at home- [place]- [place] for the month due to the holiday. - Pt will need long term IV abx- and due to hx of IV drug use will need to be SNF placed for the IV abx.- CSW aware and following -unsure at this time treatment plan in regards to any surgical plans  Expected Discharge Date:                  Expected Discharge Plan:  Skilled Nursing Facility  In-House Referral:  Clinical Social Work  Discharge planning Services  CM Consult  Post Acute Care Choice:    Choice offered to:     DME Arranged:    DME Agency:     HH Arranged:    HH Agency:     Status of Service:  In process, will continue to follow  Medicare Important Message Given:    Date Medicare IM Given:    Medicare IM give by:    Date Additional Medicare IM Given:    Additional Medicare Important Message give by:     If discussed at Long Length of Stay Meetings, dates discussed:  04/15/15  Additional Comments:  04/14/15- pt for TEE today  Darrold SpanWebster, Stephaun Million Hall, RN 04/14/2015, 12:23 PM

## 2015-04-14 NOTE — H&P (View-Only) (Signed)
Patient Demographics  Melanie Crane, is a 39 y.o. female, DOB - December 04, 1975, JXB:[id number]  Admit date - 04/10/2015   Admitting Physician Alberteen Sam, MD  Outpatient Primary MD for the patient is No PCP Per Patient  LOS - 3   Chief Complaint  Patient presents with  . Chest Pain  . 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  Subjective:   Areta Haber today has, No headache, No chest pain, No abdominal pain - No Nausea, No new weakness tingling or numbness, No Cough - SOB.   Assessment & Plan    Principal Problem:   Pulmonary embolism (HCC) Active Problems:   IV drug abuse   Anemia   Thrombocytopenia, unspecified (HCC)   Cigarette smoker   Hepatitis C   Septic embolism (HCC)   Lung abscess (HCC)   PE (pulmonary embolism)   Embolism, pulmonary with infarction (HCC)   Endocarditis   Abscess of right lung with pneumonia (HCC)   Acute septic pulmonary embolism without acute cor pulmonale (HCC)   Sepsis (HCC)   Prosthetic valve  endocarditis (HCC)  Pulmonary embolus with pulmonary parenchymal infarction - CTA chest with evidence of Acute left-sided pulmonary embolism with pulmonary parenchymal infarction. Septic vs bland emboli considered. - Pulmonary consult appreciated, they do recommend to continue with anticoagulation, especially they are not able to rule out PE, especially the PE is large enough to fill LLL pulmonary artery and segmental branches which is too large to be from vegetation. -  bilateral lower extremity venous DopplerNegative for DVT.   Septic emboli with cavitating lung lesion /endocarditis - CTA chest with evidence of multiple pulmonary nodules and masslike lesion, this is most likely related to septic emboli and lung abscess. - History of endocarditis GBS on  July 2015, and Pseudomonas on April 2016. - ID consult appreciated, initially on IV vancomycin and Zosyn (initially on Rocephin which was transitioned back to Zosyn giving her history of Pseudomonas bacteremia ).  - Blood cultures no growth to date - 2-D echo with mobile vegetation on tricuspid valve(patient status post tricuspid replacement), CT surgery consulted, patient may eventually need a dual TVR . - Cardiology consulted , and is for TEE .  Anemia - Most likely anemia of chronic disease, hemoglobin trending down but this is most likely dilutional as she is on IV fluids, no signs of overt bleed..  IV drug abuse - Patient was counseled - HIV nonreactive  Hepatitis C -  Workup as per ID, Check Hep C VL, genotype.  Unclear history of lupus anticoagulant - Follow with Hematology As an Outpatient    Code Status: Full  Family Communication: None at bedside  Disposition Plan: Pending further work up   Procedures  None   Consults   ID PCCM CT surgery  cardiology  Medications  Scheduled Meds: . piperacillin-tazobactam (ZOSYN)  IV  3.375 g Intravenous 3 times per day  . sodium chloride  3 mL Intravenous Q12H  .  vancomycin  1,250 mg Intravenous Q8H   Continuous Infusions: . sodium chloride 75 mL/hr at 04/12/15 0815  . sodium chloride    . heparin 2,700 Units/hr (04/13/15 1425)   PRN Meds:.HYDROmorphone (DILAUDID) injection, oxyCODONE  DVT Prophylaxis   Heparin GTT  Lab Results  Component Value Date   PLT 179 04/13/2015    Antibiotics  Anti-infectives    Start     Dose/Rate Route Frequency Ordered Stop   04/13/15 1400  piperacillin-tazobactam (ZOSYN) IVPB 3.375 g     3.375 g 12.5 mL/hr over 240 Minutes Intravenous 3 times per day 04/13/15 1144     04/13/15 0200  vancomycin (VANCOCIN) 1,250 mg in sodium chloride 0.9 % 250 mL IVPB     1,250 mg 166.7 mL/hr over 90 Minutes Intravenous Every 8 hours 04/12/15 2131     04/11/15 1030  cefTRIAXone (ROCEPHIN) 2 g in dextrose 5 % 50 mL IVPB  Status:  Discontinued     2 g 100 mL/hr over 30 Minutes Intravenous Every 24 hours 04/11/15 1023 04/13/15 1112   04/11/15 0400  piperacillin-tazobactam (ZOSYN) IVPB 3.375 g  Status:  Discontinued     3.375 g 12.5 mL/hr over 240 Minutes Intravenous Every 8 hours 04/10/15 1925 04/11/15 1023   04/11/15 0400  vancomycin (VANCOCIN) IVPB 750 mg/150 ml premix  Status:  Discontinued     750 mg 150 mL/hr over 60 Minutes Intravenous Every 8 hours 04/10/15 1925 04/12/15 2131   04/10/15 1930  vancomycin (VANCOCIN) 1,500 mg in sodium chloride 0.9 % 500 mL IVPB     1,500 mg 250 mL/hr over 120 Minutes Intravenous  Once 04/10/15 1925 04/10/15 2235   04/10/15 1915  piperacillin-tazobactam (ZOSYN) IVPB 3.375 g     3.375 g 100 mL/hr over 30 Minutes Intravenous  Once 04/10/15 1910 04/10/15 2107   04/10/15 1915  vancomycin (VANCOCIN) IVPB 1000 mg/200 mL premix  Status:  Discontinued     1,000 mg 200 mL/hr over 60 Minutes Intravenous  Once 04/10/15 1910 04/10/15 1925          Objective:   Filed Vitals:   04/12/15 1332 04/12/15 2236 04/13/15 0414 04/13/15 1300  BP: 124/82 113/78 118/96 113/84  Pulse: 96 97 91 93    Temp: 100 F (37.8 C) 99.5 F (37.5 C) 98.7 F (37.1 C)   TempSrc: Oral Oral Oral   Resp: 20 22 34 20  Height:      Weight:      SpO2: 100% 97% 97% 97%    Wt Readings from Last 3 Encounters:  04/11/15 73.074 kg (161 lb 1.6 oz)  12/28/13 70.2 kg (154 lb 12.2 oz)     Intake/Output Summary (Last 24 hours) at 04/13/15 1614 Last data filed at 04/13/15 1230  Gross per 24 hour  Intake 4618.75 ml  Output    800 ml  Net 3818.75 ml     Physical Exam  Awake Alert, Oriented X 3,  [place].AT,PERRAL Supple Neck,No JVD, Symmetrical Chest wall movement,  diminished air movement bilaterally,  No Gallops,Rubs or new Murmurs, No Parasternal Heave +ve B.Sounds, Abd Soft, No tenderness, No organomegaly appriciated, No rebound - guarding or rigidity. No Cyanosis, Clubbing or edema, No new Rash or bruise     Data Review   Micro Results Recent Results (from the past 240 hour(s))  Blood Culture (routine x 2)     Status: None (Preliminary result)   Collection Time: 04/10/15  7:30 PM  Result Value Ref Range Status   Specimen Description BLOOD RIGHT ARM  Final   Special Requests BOTTLES DRAWN AEROBIC AND ANAEROBIC 5CC  Final   Culture NO GROWTH 3 DAYS  Final   Report Status PENDING  Incomplete  Blood Culture (routine x 2)     Status: None (Preliminary result)   Collection Time: 04/10/15  7:45 PM  Result Value Ref Range Status   Specimen Description BLOOD LEFT ARM  Final   Special Requests BOTTLES DRAWN AEROBIC AND ANAEROBIC 5CC  Final   Culture NO GROWTH 3 DAYS  Final   Report Status PENDING  Incomplete  Urine culture     Status: None   Collection Time: 04/10/15  7:50 PM  Result Value Ref Range Status   Specimen Description URINE, RANDOM  Final   Special Requests NONE  Final   Culture NO GROWTH 1 DAY  Final   Report Status 04/12/2015 FINAL  Final  Culture, blood (routine x 2)     Status: None (Preliminary result)   Collection Time: 04/11/15  1:35 AM  Result Value Ref Range Status    Specimen Description BLOOD ARM LEFT  Final   Special Requests BOTTLES DRAWN AEROBIC ONLY 1CC  Final   Culture NO GROWTH 2 DAYS  Final   Report Status PENDING  Incomplete  Culture, blood (routine x 2)     Status: None (Preliminary result)   Collection Time: 04/11/15  1:42 AM  Result Value Ref Range Status   Specimen Description BLOOD HAND LEFT  Final   Special Requests BOTTLES DRAWN AEROBIC ONLY 1CC  Final   Culture NO GROWTH 2 DAYS  Final   Report Status PENDING  Incomplete    Radiology Reports Dg Chest 2 View  04/10/2015  CLINICAL DATA:  Pain upper right breast towards medial border. 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 Chest pain and shortness breath for 2 days. EXAM: CHEST  2 VIEW COMPARISON:  Chest x-ray dated 12/28/2013. FINDINGS: Heart size is upper normal, not significantly changed compared to the previous exam. Overall cardiomediastinal silhouette is stable in size and configuration. Ill-defined opacities are seen within the right mid lung region and at the left lung base. A focal rounded nodular density overlying a mid thoracic vertebral body on the lateral view may be related to the right mid lung opacity. No pleural effusions seen. No pneumothorax. No acute osseous abnormality. IMPRESSION: 1. Ill-defined opacity within the right mid lung region with an additional smaller ill-defined opacity at the left lung base. This could represent asymmetric edema or pneumonia. 2. Focal nodular density overlying a mid thoracic vertebral body on the lateral view, measuring 1.3 cm greatest dimension, which may be related to the right mid lung opacity. A neoplastic process cannot be excluded. Recommend chest CT for further characterization. Electronically Signed   By: Bary Richard M.D.   On: 04/10/2015 18:34   Ct Angio Chest Pe W/cm &/or Wo Cm  04/10/2015  CLINICAL DATA:  Right-sided chest pain for 2 days, shortness of breath, cough, chest pain,  IV drug abuse, acute bacterial endocarditis EXAM: CT  ANGIOGRAPHY CHEST WITH CONTRAST TECHNIQUE: Multidetector CT imaging of the chest was performed using the standard protocol during bolus administration of intravenous contrast. Multiplanar CT image reconstructions and MIPs were obtained to evaluate the vascular anatomy. CONTRAST:  80mL OMNIPAQUE IOHEXOL 350 MG/ML SOLN COMPARISON:  04/10/2015 chest radiograph FINDINGS: Mediastinum/Nodes: There is filling defect in the left lower lobe pulmonary artery at the hilum extending into numerous branches. Branches supplying the lateral left lower lobe appear totally occluded as this vessel extends into the area of consolidation in the anterior lateral left lower lobe described above. Left upper lobe pulmonary arteries are clear. Right pulmonary arteries are clear. There are numerous mediastinal lymph nodes. Sub carinal adenopathy measures 12 mm and right hilar adenopathy measures 12 mm. Lungs/Pleura: Posteriorly in the right lower lobe superior segment, there is a 5.2. x 3.8 x 4.5 cm well-defined rounded pleural-based abnormality that consist of consolidation mixed with areas of air attenuation. The walls of this lesion are mildly irregular and there is mild surrounding inflammatory change. There are multiple irregular right upper lobe pulmonary nodules, seen on series 5, image number 47, 48, and 59. The largest of these is seen on image 59 and measures 10 mm. There is consolidation in the anterior inferior medial right middle lobe which appears most consistent with atelectasis. In the left upper lobe there is an irregular 7 mm pulmonary nodule on image number 63. There is a 6 mm nodule in the left upper lobe on image number 45. There is consolidation in the anterior lateral aspect of the left lower lobe at the level of the diaphragm. There is a small right pleural effusion. Upper abdomen: The spleen is only partially imaged but appears possibly enlarged. The liver may also be enlarged. Musculoskeletal: No acute or focally  suspicious findings. Review of the MIP images confirms the above findings. IMPRESSION: 1. Acute left-sided pulmonary embolism with pulmonary parenchymal infarction. Septic vs bland emboli considered. 2. Large complex masslike structure right lower lobe. Malignancy is not excluded. However, cavitating pneumonia or pulmonary abscess considered more likely, possibly from septic embolic seeding vs aspiration. 3. Multiple bilateral pulmonary nodules. Differential diagnostic possibilities include metastatic disease or septic emboli. 4. Several enlarged mediastinal lymph nodes could be reactive. Metastatic disease not excluded. Critical Value/emergent results were called by telephone at the time of interpretation on 04/10/2015 at 9:17 pm to Dr. Patria Mane , who verbally acknowledged these results. Electronically Signed   By: Esperanza Heir M.D.   On: 04/10/2015 21:18     CBC  Recent Labs Lab 04/10/15 1819 04/10/15 1935 04/11/15 0520 04/12/15 0140 04/13/15 0130  WBC 15.2*  --  13.9* 12.9* 9.1  HGB 10.6*  --  10.0* 9.5* 8.7*  HCT 34.2*  --  32.2* 30.4* 29.2*  PLT 147*  --  128* 159 179  MCV 75.3*  --  76.3* 75.6* 76.2*  MCH 23.3*  --  23.7* 23.6* 22.7*  MCHC 31.0  --  31.1 31.3 29.8*  RDW 18.2*  --  18.5* 18.2* 18.0*  LYMPHSABS  --  1.9  --   --   --   MONOABS  --  1.4*  --   --   --   EOSABS  --  0.0  --   --   --   BASOSABS  --  0.0  --   --   --     Chemistries   Recent Labs Lab 04/10/15 1819 04/10/15 1935  04/11/15 0520 04/12/15 0140 04/13/15 0130  NA 131*  --  134* 137 137  K 4.0  --  4.1 3.7 3.7  CL 97*  --  104 107 108  CO2 24  --  21* 21* 19*  GLUCOSE 141*  --  111* 114* 108*  BUN 9  --  8 9 10   CREATININE 0.85  --  0.77 0.71 0.67  CALCIUM 8.4*  --  7.9* 8.1* 8.1*  AST  --  33  --   --   --   ALT  --  39  --   --   --   ALKPHOS  --  99  --   --   --   BILITOT  --  1.2  --   --   --     ------------------------------------------------------------------------------------------------------------------ estimated creatinine clearance is 92.6 mL/min (by C-G formula based on Cr of 0.67). ------------------------------------------------------------------------------------------------------------------ No results for input(s): HGBA1C in the last 72 hours. ------------------------------------------------------------------------------------------------------------------ No results for input(s): CHOL, HDL, LDLCALC, TRIG, CHOLHDL, LDLDIRECT in the last 72 hours. ------------------------------------------------------------------------------------------------------------------ No results for input(s): TSH, T4TOTAL, T3FREE, THYROIDAB in the last 72 hours.  Invalid input(s): FREET3 ------------------------------------------------------------------------------------------------------------------  Recent Labs  04/11/15 0142  RETICCTPCT 1.2    Coagulation profile No results for input(s): INR, PROTIME in the last 168 hours.  No results for input(s): DDIMER in the last 72 hours.  Cardiac Enzymes No results for input(s): CKMB, TROPONINI, MYOGLOBIN in the last 168 hours.  Invalid input(s): CK ------------------------------------------------------------------------------------------------------------------ Invalid input(s): POCBNP     Time Spent in minutes   30 minutes   Cardelia Sassano M.D on 04/13/2015 at 4:14 PM  Between 7am to 7pm - Pager - [phone]  After 7pm go to [email or web address] - password [email or web address]  [facility]   Office  [phone]

## 2015-04-14 NOTE — Progress Notes (Signed)
Patient Demographics  Melanie Crane, is a 39 y.o. female, DOB - 12-21-1975, ZOX:[id number]  Admit date - 04/10/2015   Admitting Physician Alberteen Sam, MD  Outpatient Primary MD for the patient is No PCP Per Patient  LOS - 4   Chief Complaint  Patient presents with  . Chest Pain  . Shortness of Breath       Admission HPI/Brief narrative: 39 year old female with history of IV drug abuse, with diagnosis of tricuspid valve endocarditis and septic joint at [facility] in July 2015, culture growing group B strep, treated with ORITAVANCIN, patient moved to [place], had tricuspid valve surgery October of last year, records showing she was admitted in April of this year secondary to Pseudomonas endocarditis, treated with cefepime and tobramycin, transitioned to oral levofloxacin on discharge, hospital course was complicated by right upper arm DVT secondary to PICC line, presents 10/29 with complaints of fever, dyspnea and chest pain, workup significant for PE(septic versus bland) and septic pulmonary emboli with cavitating lung lesion, 2-D echo done 10/30 showing mobile vegetation on new  tricuspid valve, patient seen by CT surgery, recommended no surgery at this point. TEE demonstrated vegetation on TV. Will need 6 weeks of IV antibiotics.   Subjective:   Ilia Dimaano today has, No headache, No SOB; No abdominal pain - No Nausea. Still with pleuritic CP.  Assessment & Plan   Principal Problem:   Acute septic pulmonary embolism without acute cor pulmonale (HCC) Active Problems:   IV drug abuse   Anemia   Thrombocytopenia, unspecified (HCC)   Cigarette smoker   Hepatitis C   Septic embolism (HCC)   Embolism, pulmonary with infarction (HCC)   Abscess of right lung with pneumonia (HCC)   Sepsis (HCC)   Prosthetic valve endocarditis (HCC)  Pulmonary embolus with pulmonary parenchymal infarction - CTA  chest with evidence of Acute left-sided pulmonary embolism with pulmonary parenchymal infarction. Septic vs bland emboli considered. - Pulmonary consult appreciated, they do recommend to continue with anticoagulation, especially they are not able to rule out PE, especially the PE is large enough to fill LLL pulmonary artery and segmental branches which is too large to be just from vegetation. -  bilateral lower extremity venous DopplerNegative for DVT. -will continue IV antibiotics and anticoagulation   Septic emboli with cavitating lung lesion /endocarditis - CTA chest with evidence of multiple pulmonary nodules and masslike lesion, this is most likely related to septic emboli and lung abscess. - History of endocarditis GBS on  July 2015, and Pseudomonas on April 2016. - ID consult appreciated, continue IV vancomycin and Zosyn (initially on Rocephin which was transitioned back to Zosyn giving her history of Pseudomonas bacteremia ).  - Blood cultures no growth to date, will follow - 2-D echo with mobile vegetation on tricuspid valve(patient status post tricuspid replacement), CT surgery consulted, patient not a candidate for redo surgery at this moment. Continue IV abx's - Cardiology has done TEE and demonstrated positive TV   Anemia - Most likely anemia of chronic disease, hemoglobin trending down but this is most likely dilutional as she is on IV fluids, no signs of overt bleed.. -will monitor trend   IV drug abuse -cessation counseling provided -HIV nonreactive -will benefit of inpatient treatment (if in agreement)  Hepatitis C - Workup as per ID, Check Hep C VL, genotype. -will follow rec's  Unclear history of lupus anticoagulant -continue Follow up with Hematology As an Outpatient   Code Status: Full  Family Communication: None at bedside  Disposition Plan: will need prolong IV antibiotics at discharge; needs SNF for it   Procedures  TEE: normal LV function; moderate RAE;  s/p TVR with tricuspid stenosis (mean gradient 12 mmHg); mild TR; vegetation on prosthetic TV; no vegetations on aortic, mitral or pulmonic valves.   Consults   ID PCCM CT surgery Cardiology  Medications  Scheduled Meds: . piperacillin-tazobactam (ZOSYN)  IV  3.375 g Intravenous 3 times per day  . sodium chloride  3 mL Intravenous Q12H  . vancomycin  1,250 mg Intravenous Q8H   Continuous Infusions: . sodium chloride 75 mL/hr at 04/12/15 0815  . heparin 2,800 Units/hr (04/14/15 1255)   PRN Meds:.HYDROmorphone (DILAUDID) injection, oxyCODONE  DVT Prophylaxis   Heparin GTT  Lab Results  Component Value Date   PLT 213 04/14/2015    Antibiotics  Anti-infectives    Start     Dose/Rate Route Frequency Ordered Stop   04/13/15 1400  piperacillin-tazobactam (ZOSYN) IVPB 3.375 g     3.375 g 12.5 mL/hr over 240 Minutes Intravenous 3 times per day 04/13/15 1144     04/13/15 0200  vancomycin (VANCOCIN) 1,250 mg in sodium chloride 0.9 % 250 mL IVPB     1,250 mg 166.7 mL/hr over 90 Minutes Intravenous Every 8 hours 04/12/15 2131     04/11/15 1030  cefTRIAXone (ROCEPHIN) 2 g in dextrose 5 % 50 mL IVPB  Status:  Discontinued     2 g 100 mL/hr over 30 Minutes Intravenous Every 24 hours 04/11/15 1023 04/13/15 1112   04/11/15 0400  piperacillin-tazobactam (ZOSYN) IVPB 3.375 g  Status:  Discontinued     3.375 g 12.5 mL/hr over 240 Minutes Intravenous Every 8 hours 04/10/15 1925 04/11/15 1023   04/11/15 0400  vancomycin (VANCOCIN) IVPB 750 mg/150 ml premix  Status:  Discontinued     750 mg 150 mL/hr over 60 Minutes Intravenous Every 8 hours 04/10/15 1925 04/12/15 2131   04/10/15 1930  vancomycin (VANCOCIN) 1,500 mg in sodium chloride 0.9 % 500 mL IVPB     1,500 mg 250 mL/hr over 120 Minutes Intravenous  Once 04/10/15 1925 04/10/15 2235   04/10/15 1915  piperacillin-tazobactam (ZOSYN) IVPB 3.375 g     3.375 g 100 mL/hr over 30 Minutes Intravenous  Once 04/10/15 1910 04/10/15 2107    04/10/15 1915  vancomycin (VANCOCIN) IVPB 1000 mg/200 mL premix  Status:  Discontinued     1,000 mg 200 mL/hr over 60 Minutes Intravenous  Once 04/10/15 1910 04/10/15 1925       Objective:   Filed Vitals:   04/14/15 1045 04/14/15 1050 04/14/15 1100 04/14/15 1457  BP: 118/81 113/78 112/75 107/84  Pulse: 89 86 84 79  Temp:    99.4 F (37.4 C)  TempSrc:    Oral  Resp: Height:      Weight:      SpO2: 95% 95% 95% 97%    Wt Readings from Last 3 Encounters:  04/14/15 73.029 kg (161 lb)  12/28/13 70.2 kg (154 lb 12.2 oz)     Intake/Output Summary (Last 24 hours) at 04/14/15 1815 Last data filed at 04/14/15 1317  Gross per 24 hour  Intake      0 ml  Output   2500  ml  Net  -2500 ml     Physical Exam Awake Alert, Oriented X 3, afebrile and in no distress. Complaining of pleuritic CP Supple Neck, No JVD, no thyromegaly Symmetrical Chest wall movement, fair air movement, no wheezing No Gallops, Rubs or new Murmurs, S1 and S2 +ve B.Sounds, Abd Soft, No tenderness, No organomegaly appriciated, No rebound - guarding or rigidity. No Cyanosis, Clubbing or edema, No new Rash or bruise     Data Review   Micro Results Recent Results (from the past 240 hour(s))  Blood Culture (routine x 2)     Status: None (Preliminary result)   Collection Time: 04/10/15  7:30 PM  Result Value Ref Range Status   Specimen Description BLOOD RIGHT ARM  Final   Special Requests BOTTLES DRAWN AEROBIC AND ANAEROBIC 5CC  Final   Culture NO GROWTH 4 DAYS  Final   Report Status PENDING  Incomplete  Blood Culture (routine x 2)     Status: None (Preliminary result)   Collection Time: 04/10/15  7:45 PM  Result Value Ref Range Status   Specimen Description BLOOD LEFT ARM  Final   Special Requests BOTTLES DRAWN AEROBIC AND ANAEROBIC 5CC  Final   Culture NO GROWTH 4 DAYS  Final   Report Status PENDING  Incomplete  Urine culture     Status: None   Collection Time: 04/10/15  7:50 PM  Result  Value Ref Range Status   Specimen Description URINE, RANDOM  Final   Special Requests NONE  Final   Culture NO GROWTH 1 DAY  Final   Report Status 04/12/2015 FINAL  Final  Culture, blood (routine x 2)     Status: None (Preliminary result)   Collection Time: 04/11/15  1:35 AM  Result Value Ref Range Status   Specimen Description BLOOD ARM LEFT  Final   Special Requests BOTTLES DRAWN AEROBIC ONLY 1CC  Final   Culture NO GROWTH 3 DAYS  Final   Report Status PENDING  Incomplete  Culture, blood (routine x 2)     Status: None (Preliminary result)   Collection Time: 04/11/15  1:42 AM  Result Value Ref Range Status   Specimen Description BLOOD HAND LEFT  Final   Special Requests BOTTLES DRAWN AEROBIC ONLY 1CC  Final   Culture NO GROWTH 3 DAYS  Final   Report Status PENDING  Incomplete    Radiology Reports Dg Chest 2 View  04/10/2015  CLINICAL DATA:  Pain upper right breast towards medial border. 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 Chest pain and shortness breath for 2 days. EXAM: CHEST  2 VIEW COMPARISON:  Chest x-ray dated 12/28/2013. FINDINGS: Heart size is upper normal, not significantly changed compared to the previous exam. Overall cardiomediastinal silhouette is stable in size and configuration. Ill-defined opacities are seen within the right mid lung region and at the left lung base. A focal rounded nodular density overlying a mid thoracic vertebral body on the lateral view may be related to the right mid lung opacity. No pleural effusions seen. No pneumothorax. No acute osseous abnormality. IMPRESSION: 1. Ill-defined opacity within the right mid lung region with an additional smaller ill-defined opacity at the left lung base. This could represent asymmetric edema or pneumonia. 2. Focal nodular density overlying a mid thoracic vertebral body on the lateral view, measuring 1.3 cm greatest dimension, which may be related to the right mid lung opacity. A neoplastic process cannot be excluded.  Recommend chest CT for further characterization. Electronically Signed  By: Bary RichardStan  Maynard M.D.   On: 04/10/2015 18:34   Ct Angio Chest Pe W/cm &/or Wo Cm  04/10/2015  CLINICAL DATA:  Right-sided chest pain for 2 days, shortness of breath, cough, chest pain, IV drug abuse, acute bacterial endocarditis EXAM: CT ANGIOGRAPHY CHEST WITH CONTRAST TECHNIQUE: Multidetector CT imaging of the chest was performed using the standard protocol during bolus administration of intravenous contrast. Multiplanar CT image reconstructions and MIPs were obtained to evaluate the vascular anatomy. CONTRAST:  80mL OMNIPAQUE IOHEXOL 350 MG/ML SOLN COMPARISON:  04/10/2015 chest radiograph FINDINGS: Mediastinum/Nodes: There is filling defect in the left lower lobe pulmonary artery at the hilum extending into numerous branches. Branches supplying the lateral left lower lobe appear totally occluded as this vessel extends into the area of consolidation in the anterior lateral left lower lobe described above. Left upper lobe pulmonary arteries are clear. Right pulmonary arteries are clear. There are numerous mediastinal lymph nodes. Sub carinal adenopathy measures 12 mm and right hilar adenopathy measures 12 mm. Lungs/Pleura: Posteriorly in the right lower lobe superior segment, there is a 5.2. x 3.8 x 4.5 cm well-defined rounded pleural-based abnormality that consist of consolidation mixed with areas of air attenuation. The walls of this lesion are mildly irregular and there is mild surrounding inflammatory change. There are multiple irregular right upper lobe pulmonary nodules, seen on series 5, image number 47, 48, and 59. The largest of these is seen on image 59 and measures 10 mm. There is consolidation in the anterior inferior medial right middle lobe which appears most consistent with atelectasis. In the left upper lobe there is an irregular 7 mm pulmonary nodule on image number 63. There is a 6 mm nodule in the left upper lobe on  image number 45. There is consolidation in the anterior lateral aspect of the left lower lobe at the level of the diaphragm. There is a small right pleural effusion. Upper abdomen: The spleen is only partially imaged but appears possibly enlarged. The liver may also be enlarged. Musculoskeletal: No acute or focally suspicious findings. Review of the MIP images confirms the above findings. IMPRESSION: 1. Acute left-sided pulmonary embolism with pulmonary parenchymal infarction. Septic vs bland emboli considered. 2. Large complex masslike structure right lower lobe. Malignancy is not excluded. However, cavitating pneumonia or pulmonary abscess considered more likely, possibly from septic embolic seeding vs aspiration. 3. Multiple bilateral pulmonary nodules. Differential diagnostic possibilities include metastatic disease or septic emboli. 4. Several enlarged mediastinal lymph nodes could be reactive. Metastatic disease not excluded. Critical Value/emergent results were called by telephone at the time of interpretation on 04/10/2015 at 9:17 pm to Dr. Patria Maneampos , who verbally acknowledged these results. Electronically Signed   By: Esperanza Heiraymond  Rubner M.D.   On: 04/10/2015 21:18     CBC  Recent Labs Lab 04/10/15 1819 04/10/15 1935 04/11/15 0520 04/12/15 0140 04/13/15 0130 04/14/15 0426  WBC 15.2*  --  13.9* 12.9* 9.1 6.5  HGB 10.6*  --  10.0* 9.5* 8.7* 8.3*  HCT 34.2*  --  32.2* 30.4* 29.2* 27.1*  PLT 147*  --  128* 159 179 213  MCV 75.3*  --  76.3* 75.6* 76.2* 74.9*  MCH 23.3*  --  23.7* 23.6* 22.7* 22.9*  MCHC 31.0  --  31.1 31.3 29.8* 30.6  RDW 18.2*  --  18.5* 18.2* 18.0* 17.9*  LYMPHSABS  --  1.9  --   --   --   --   MONOABS  --  1.4*  --   --   --   --  EOSABS  --  0.0  --   --   --   --   BASOSABS  --  0.0  --   --   --   --     Chemistries   Recent Labs Lab 04/10/15 1819 04/10/15 1935 04/11/15 0520 04/12/15 0140 04/13/15 0130  NA 131*  --  134* 137 137  K 4.0  --  4.1 3.7 3.7  CL  97*  --  104 107 108  CO2 24  --  21* 21* 19*  GLUCOSE 141*  --  111* 114* 108*  BUN 9  --  CREATININE 0.85  --  0.77 0.71 0.67  CALCIUM 8.4*  --  7.9* 8.1* 8.1*  AST  --  33  --   --   --   ALT  --  39  --   --   --   ALKPHOS  --  99  --   --   --   BILITOT  --  1.2  --   --   --    ------------------------------------------------------------------------------------------------------------------ estimated creatinine clearance is 92.4 mL/min (by C-G formula based on Cr of 0.67). ------------------------------------------------------------------------------------------------------------------ No results for input(s): HGBA1C in the last 72 hours. ------------------------------------------------------------------------------------------------------------------ No results for input(s): CHOL, HDL, LDLCALC, TRIG, CHOLHDL, LDLDIRECT in the last 72 hours. ------------------------------------------------------------------------------------------------------------------ No results for input(s): TSH, T4TOTAL, T3FREE, THYROIDAB in the last 72 hours.  Invalid input(s): FREET3 ------------------------------------------------------------------------------------------------------------------ No results for input(s): VITAMINB12, FOLATE, FERRITIN, TIBC, IRON, RETICCTPCT in the last 72 hours.  Coagulation profile No results for input(s): INR, PROTIME in the last 168 hours.  No results for input(s): DDIMER in the last 72 hours.  Cardiac Enzymes No results for input(s): CKMB, TROPONINI, MYOGLOBIN in the last 168 hours.  Invalid input(s): CK ------------------------------------------------------------------------------------------------------------------ Invalid input(s): POCBNP    Time Spent in minutes   30 minutes   Vassie Loll M.D on 04/14/2015 at 6:15 PM  Between 7am to 7pm - Pager - [phone]  After 7pm go to [email or web address] - password [facility]  [facility]   Office   [phone]

## 2015-04-14 NOTE — Progress Notes (Signed)
Utilization review completed.  

## 2015-04-14 NOTE — Progress Notes (Signed)
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  She presented in August 2016 as well with chest pain and TTE without obvious vegetation.  Now with vegetation on TV and septic emboli.  Possible left sided PE.  Also with a history of GBS IE.     TEE confirmed vegetation.  No surgery planned at this time due to high likelihood of reinfection.  Culture remains negative.    Physical Exam: Constitutional:  Filed Vitals:   04/14/15 1457  BP: 107/84  Pulse: 79  Temp: 99.4 F (37.4 C)  Resp: 18  ; patient appears in NAD Eyes: anicteric HENT: no thrush Respiratory: Normal respiratory effort; CTA B Cardiovascular: Tachy RR  Constitutional: positive for fatigue or negative for fevers and chills Cardiovascular: positive for chest pain Gastrointestinal: negative for diarrhea  Lab Results  Component Value Date   WBC 6.5 04/14/2015   HGB 8.3* 04/14/2015   HCT 27.1* 04/14/2015   MCV 74.9* 04/14/2015   PLT 213 04/14/2015    Lab Results  Component Value Date   CREATININE 0.67 04/13/2015   BUN 10 04/13/2015   NA 137 04/13/2015   K 3.7 04/13/2015   CL 108 04/13/2015   CO2 19* 04/13/2015    Lab Results  Component Value Date   ALT 39 04/10/2015   AST 33 04/10/2015   ALKPHOS 99 04/10/2015     Microbiology: Recent Results (from the past 240 hour(s))  Blood Culture (routine x 2)     Status: None (Preliminary result)   Collection Time: 04/10/15  7:30 PM  Result Value Ref Range Status   Specimen Description BLOOD RIGHT ARM  Final   Special Requests BOTTLES DRAWN AEROBIC AND ANAEROBIC 5CC  Final   Culture NO GROWTH 4 DAYS  Final    Report Status PENDING  Incomplete  Blood Culture (routine x 2)     Status: None (Preliminary result)   Collection Time: 04/10/15  7:45 PM  Result Value Ref Range Status   Specimen Description BLOOD LEFT ARM  Final   Special Requests BOTTLES DRAWN AEROBIC AND ANAEROBIC 5CC  Final   Culture NO GROWTH 4 DAYS  Final   Report Status PENDING  Incomplete  Urine culture     Status: None   Collection Time: 04/10/15  7:50 PM  Result Value Ref Range Status   Specimen Description URINE, RANDOM  Final   Special Requests NONE  Final   Culture NO GROWTH 1 DAY  Final   Report Status 04/12/2015 FINAL  Final  Culture, blood (routine x 2)     Status: None (Preliminary result)   Collection Time: 04/11/15  1:35 AM  Result Value Ref Range Status   Specimen Description BLOOD ARM LEFT  Final   Special Requests BOTTLES DRAWN AEROBIC ONLY 1CC  Final   Culture NO GROWTH 3 DAYS  Final   Report Status PENDING  Incomplete  Culture, blood (routine x 2)     Status: None (Preliminary result)   Collection Time: 04/11/15  1:42 AM  Result Value Ref Range Status   Specimen Description BLOOD HAND LEFT  Final   Special Requests  BOTTLES DRAWN AEROBIC ONLY 1CC  Final   Culture NO GROWTH 3 DAYS  Final   Report Status PENDING  Incomplete    Impression:  1. Prosthetic valve endocarditis 2. Drug abuse 3. Possible pulmonary embolus 4. Septic emboli 5. Recent history of Pseudomonal bacteremia  Plan: 1. Continue with broad antibiotic coverage pending an ID of organism 2. Substance abuse counseling 3.  If blood cultures remain negative tomorrow, ok for picc line and presumption of vancomycin + zosyn for 6 weeks through December 10th.

## 2015-04-14 NOTE — Progress Notes (Addendum)
CSW met pt at bedside to discuss SNF- pt is agreeable to SNF placement for IV antibiotics and understands that placement might not be close to home since pt does not have insurance  PASAR# [id number]   CSW will continue to follow  Domenica Reamer, [place] Social Worker [phone]

## 2015-04-14 NOTE — NC FL2 (Signed)
[place] MEDICAID FL2 LEVEL OF CARE SCREENING TOOL     IDENTIFICATION  Patient Name: Melanie Crane Birthdate: 09/12/1975 Sex: female Admission Date (Current Location): 04/10/2015  [facility] and [place] Number: Producer, television/film/video and Address:  [facility] [employer], [address], [place], [place] [address]      Provider Number: [id number]  Attending Physician Name and Address:  Vassie Loll, MD  Relative Name and Phone Number:       Current Level of Care: Hospital Recommended Level of Care: Skilled Nursing Facility Prior Approval Number:    Date Approved/Denied:   PASRR Number:    Discharge Plan: SNF    Current Diagnoses: Patient Active Problem List   Diagnosis Date Noted  . Acute septic pulmonary embolism without acute cor pulmonale (HCC)   . Sepsis (HCC)   . Prosthetic valve endocarditis (HCC)   . Embolism, pulmonary with infarction (HCC) 04/11/2015  . Endocarditis 04/11/2015  . Abscess of right lung with pneumonia (HCC)   . Pulmonary embolism (HCC) 04/10/2015  . Septic embolism (HCC) 04/10/2015  . Lung abscess (HCC) 04/10/2015  . PE (pulmonary embolism) 04/10/2015  . Hepatitis C 12/30/2013  . Hypokalemia 12/30/2013  . UTI (urinary tract infection) 12/30/2013  . Acute bacterial endocarditis - right sided 12/29/2013  . IV drug abuse 12/29/2013  . Anemia 12/29/2013  . Thrombocytopenia, unspecified (HCC) 12/29/2013  . Cigarette smoker 12/29/2013  . Shock (HCC) 12/28/2013  . Secondary amenorrhea 12/28/2013  . Bacterial myositis 12/28/2013    Orientation ACTIVITIES/SOCIAL BLADDER RESPIRATION    Self, Time, Situation, Place    Continent O2 (As needed) (2L)  BEHAVIORAL SYMPTOMS/MOOD NEUROLOGICAL BOWEL NUTRITION STATUS      Continent Diet  PHYSICIAN VISITS COMMUNICATION OF NEEDS Height & Weight Skin    Verbally   161 lbs. Normal          AMBULATORY STATUS RESPIRATION    Assist independent O2 (As needed) (2L)      Personal Care  Assistance Level of Assistance   (independent)            Functional Limitations Info                SPECIAL CARE FACTORS FREQUENCY                      Additional Factors Info                  Current Medications (04/14/2015): Current Facility-Administered Medications  Medication Dose Route Frequency Provider Last Rate Last Dose  . 0.9 %  sodium chloride infusion   Intravenous Continuous Alberteen Sam, MD 75 mL/hr at 04/12/15 0815    . 0.9 %  sodium chloride infusion   Intravenous Continuous Bhavinkumar Bhagat, PA      . butamben-tetracaine-benzocaine (CETACAINE) spray    PRN Lewayne Bunting, MD   2 spray at 04/14/15 1011  . fentaNYL (SUBLIMAZE) injection    PRN Lewayne Bunting, MD   25 mcg at 04/14/15 1019  . heparin ADULT infusion 100 units/mL (25000 units/250 mL)  2,800 Units/hr Intravenous Continuous Marquita Palms, RPH 28 mL/hr at 04/13/15 2242 2,800 Units/hr at 04/13/15 2242  . [MAR Hold] HYDROmorphone (DILAUDID) injection 0.5-1 mg  0.5-1 mg Intravenous Q3H PRN Starleen Arms, MD   1 mg at 04/14/15 0840  . midazolam (VERSED) injection    PRN Lewayne Bunting, MD   2 mg at 04/14/15 1019  . [facility]  Hold] oxyCODONE (Oxy IR/ROXICODONE) immediate release tablet 10 mg  10 mg Oral Q4H PRN Alberteen Samhristopher P Danford, MD   10 mg at 04/14/15 0734  . [MAR Hold] piperacillin-tazobactam (ZOSYN) IVPB 3.375 g  3.375 g Intravenous 3 times per day Quenton FetterJessica B Millen, RPH   3.375 g at 04/14/15 0600  . [MAR Hold] sodium chloride 0.9 % injection 3 mL  3 mL Intravenous Q12H Alberteen Samhristopher P Danford, MD   3 mL at 04/13/15 0951  . [MAR Hold] vancomycin (VANCOCIN) 1,250 mg in sodium chloride 0.9 % 250 mL IVPB  1,250 mg Intravenous Q8H Starleen Armsawood S Elgergawy, MD   1,250 mg at 04/14/15 0816   Do not use this list as official medication orders. Please verify with discharge summary.  Discharge Medications:   Medication List    Notice    You have not been prescribed any medications.       Relevant Imaging Results:  Relevant Lab Results:  Recent Labs    Additional Information Pt will need long term IV antibiotics  [place], Binnie RailJenna M, LCSW

## 2015-04-15 ENCOUNTER — Encounter (HOSPITAL_COMMUNITY): Payer: Self-pay | Admitting: Cardiology

## 2015-04-15 DIAGNOSIS — B182 Chronic viral hepatitis C: Secondary | ICD-10-CM

## 2015-04-15 LAB — PROTIME-INR
INR: 1.31 (ref 0.00–1.49)
PROTHROMBIN TIME: 16.4 s — AB (ref 11.6–15.2)

## 2015-04-15 LAB — CBC
HCT: 26.6 % — ABNORMAL LOW (ref 36.0–46.0)
HEMOGLOBIN: 8 g/dL — AB (ref 12.0–15.0)
MCH: 22.7 pg — AB (ref 26.0–34.0)
MCHC: 30.1 g/dL (ref 30.0–36.0)
MCV: 75.6 fL — AB (ref 78.0–100.0)
PLATELETS: 213 10*3/uL (ref 150–400)
RBC: 3.52 MIL/uL — AB (ref 3.87–5.11)
RDW: 17.7 % — ABNORMAL HIGH (ref 11.5–15.5)
WBC: 6.1 10*3/uL (ref 4.0–10.5)

## 2015-04-15 LAB — CULTURE, BLOOD (ROUTINE X 2)
CULTURE: NO GROWTH
CULTURE: NO GROWTH

## 2015-04-15 LAB — HEPARIN LEVEL (UNFRACTIONATED): Heparin Unfractionated: 0.32 IU/mL (ref 0.30–0.70)

## 2015-04-15 MED ORDER — WARFARIN VIDEO
Freq: Once | Status: AC
  Administered 2015-04-16: 13:00:00

## 2015-04-15 MED ORDER — WARFARIN SODIUM 7.5 MG PO TABS
7.5000 mg | ORAL_TABLET | Freq: Once | ORAL | Status: AC
  Administered 2015-04-15: 7.5 mg via ORAL
  Filled 2015-04-15: qty 1

## 2015-04-15 MED ORDER — PATIENT'S GUIDE TO USING COUMADIN BOOK
Freq: Once | Status: AC
  Administered 2015-04-15: 18:00:00
  Filled 2015-04-15: qty 1

## 2015-04-15 MED ORDER — WARFARIN - PHARMACIST DOSING INPATIENT
Freq: Every day | Status: DC
  Administered 2015-04-17: 7.5
  Administered 2015-04-18: 18:00:00

## 2015-04-15 NOTE — Progress Notes (Signed)
ANTICOAGULATION + ANTIBIOTIC CONSULT NOTE   Pharmacy Consult for Heparin and Coumadin;  Vancomcyin and Zosyn Indication: pulmonary embolus ; sepsis and endocarditis coverage  No Known Allergies Patient Measurements: Height: 5\' 4"  (162.6 cm) Weight: 161 lb (73.029 kg) IBW/kg (Calculated) : 54.7 Heparin Dosing Weight: 73 kg  Vital Signs: Temp: 98.7 F (37.1 C) (11/03 0502) Temp Source: Oral (11/03 0502) BP: 104/72 mmHg (11/03 0502) Pulse Rate: 87 (11/03 0502) Labs:  Recent Labs  04/13/15 0130  04/13/15 1712 04/14/15 0426 04/15/15 0243  HGB 8.7*  --   --  8.3* 8.0*  HCT 29.2*  --   --  27.1* 26.6*  PLT 179  --   --  213 213  HEPARINUNFRC 0.24*  < > 0.35 0.39 0.32  CREATININE 0.67  --   --   --   --   < > = values in this interval not displayed.  Estimated Creatinine Clearance: 92.4 mL/min (by C-G formula based on Cr of 0.67).  Assessment:  39 y.o. female with a PMHx of R sided bacterial endocarditis/tricuspid vegetations, continued IVDU (injects Opana), and anemia, who presented to the ED on 04/10/15 with complaints of gradual onset chest pain and shortness of breath 2 days. CTA showed possible left sided PE or septic emboli.    Heparin level remains therapeutic (0.32) on 2800 units/hr. Hemoglobin has trended down, possibly dilutional. No bleeding noted.  To begin Coumadin today.  Coumadin predictor score = 7.       Day # 5 Vancomycin.  Dose adjusted on 10/31 when vanc trough was subtherapeutic (8 mcg/ml) on Vanc 750 mg IV q8hrs.  Currently on Vanc 1250 mg IV q8hrs.   Also on day # 3 Zosyn, due to hx Pseudomonas bacteremia.  Large vegetation on tricuspid valve, no surgery planned.  Cultures negative to date.  Planning 6 weeks of Vanc and Zosyn, through 05/22/15.   Goal of Therapy:  INR 2-3 Heparin level 0.3-0.7 units/ml Monitor platelets by anticoagulation protocol: Yes  Vancomycin troughs 15-20 mcg/ml Appropriate Zosyn dose for renal function and infection   Plan:   Continue heparin drip at 2800 units/hr.  Begin Coumadin with 7.5 mg x 1 today.  Baseline PT/INR, then daily.  Daily heparin level and CBC.  Coumadin book, video.  Education prior to discharge.    Continue Vancomycin 1250 mg IV q8hrs.  Vanc trough level prior to 8am dose on 04/16/15.  Weekly troughs while on Vanc.  Bmet at least every 3 days while in the hosptial.   Continue Zosyn 3.375 gm IV q8hrs (each over 4 hrs).     Planning 6 weeks of Vanc and Zoysn, thru 05/22/15.   Dennie Fettersgan, Jamarien Rodkey Donovan, [place] Pager: [phone] 04/15/2015 3:16 PM

## 2015-04-15 NOTE — Clinical Social Work Placement (Signed)
   CLINICAL SOCIAL WORK PLACEMENT  NOTE  Date:  04/15/2015  Patient Details  Name: Melanie Crane MRN: [id number] Date of Birth: 09/23/1975  Clinical Social Work is seeking post-discharge placement for this patient at the Skilled  Nursing Facility level of care (*CSW will initial, date and re-position this form in  chart as items are completed):  Yes   Patient/family provided with [facility] Clinical Social Work Department's list of facilities offering this level of care within the geographic area requested by the patient (or if unable, by the patient's family).  Yes   Patient/family informed of their freedom to choose among providers that offer the needed level of care, that participate in Medicare, Medicaid or managed care program needed by the patient, have an available bed and are willing to accept the patient.  Yes   Patient/family informed of [facility]'s ownership interest in [facility] and [facility], as well as of the fact that they are under no obligation to receive care at these facilities.  PASRR submitted to EDS on 04/15/15     PASRR number received on 04/15/15     Existing PASRR number confirmed on       FL2 transmitted to all facilities in geographic area requested by pt/family on       FL2 transmitted to all facilities within larger geographic area on       Patient informed that his/her managed care company has contracts with or will negotiate with certain facilities, including the following:            Patient/family informed of bed offers received.  Patient chooses bed at       Physician recommends and patient chooses bed at      Patient to be transferred to   on  .  Patient to be transferred to facility by       Patient family notified on   of transfer.  Name of family member notified:        PHYSICIAN Please sign FL2     Additional Comment:    _______________________________________________ Izora RibasHoloman, Dhiren Azimi M, LCSW 04/15/2015, 8:48  AM

## 2015-04-15 NOTE — Progress Notes (Signed)
Patient Demographics  Melanie Crane, is a 39 y.o. female, DOB - 06/18/1975, ZOX:[id number]  Admit date - 04/10/2015   Admitting Physician Alberteen Samhristopher P Danford, MD  Outpatient Primary MD for the patient is No PCP Per Patient  LOS - 5   Chief Complaint  Patient presents with  . Chest Pain  . Shortness of Breath       Admission HPI/Brief narrative: 39 year old female with history of IV drug abuse, with diagnosis of tricuspid valve endocarditis and septic joint at [facility] in July 2015, culture growing group B strep, treated with ORITAVANCIN, [place], had tricuspid valve surgery October of last year, records showing she was admitted in April of this year secondary to Pseudomonas endocarditis, treated with cefepime and tobramycin, transitioned to oral levofloxacin on discharge, hospital course was complicated by right upper arm DVT secondary to PICC line, presents 10/29 with complaints of fever, dyspnea and chest pain, workup significant for PE(septic versus bland) and septic pulmonary emboli with cavitating lung lesion, 2-D echo done 10/30 showing mobile vegetation on new  tricuspid valve, patient seen by CT surgery, recommended no surgery at this point. TEE demonstrated vegetation on TV. Will need 6 weeks of IV antibiotics.   Subjective:   Melanie Crane today has, No headache, No SOB; No abdominal pain - No Nausea. Still with pleuritic CP.  Assessment & Plan   Principal Problem:   Acute septic pulmonary embolism without acute cor pulmonale (HCC) Active Problems:   IV drug abuse   Anemia   Thrombocytopenia, unspecified (HCC)   Cigarette smoker   Hepatitis C   Septic embolism (HCC)   Embolism, pulmonary with infarction (HCC)   Abscess of right lung with pneumonia (HCC)   Sepsis (HCC)   Prosthetic valve endocarditis (HCC)  Pulmonary embolus with pulmonary parenchymal infarction - CTA  chest with evidence of Acute left-sided pulmonary embolism with pulmonary parenchymal infarction. Septic vs bland emboli considered. - Pulmonary consult appreciated, they do recommend to continue with anticoagulation, especially they are not able to rule out PE, especially the PE is large enough to fill LLL pulmonary artery and segmental branches which is too large to be just from vegetation. -  bilateral lower extremity venous DopplerNegative for DVT. -will continue IV antibiotics and anticoagulation; will initiate coumadin today  Septic emboli with cavitating lung lesion /endocarditis - CTA chest with evidence of multiple pulmonary nodules and masslike lesion, this is most likely related to septic emboli and lung abscess. - History of endocarditis GBS on  July 2015, and Pseudomonas on April 2016. - ID consult appreciated, continue IV vancomycin and Zosyn (initially on Rocephin which was transitioned back to Zosyn giving her history of Pseudomonas bacteremia ).  - Blood cultures no growth to date, will follow -per ID ok to insert PICC - 2-D echo with mobile vegetation on tricuspid valve(patient status post tricuspid replacement), CT surgery consulted, patient not a candidate for redo surgery at this moment. Continue IV abx's - Cardiology has done TEE and demonstrated positive TV   Anemia - Most likely anemia of chronic disease, hemoglobin trending down but this is most likely dilutional as she is on IV fluids, no signs of overt bleed.. -will monitor trend   IV drug abuse -cessation counseling provided -HIV nonreactive -  will benefit of inpatient treatment (if in agreement)  Hepatitis C -Workup as per ID, Check Hep C VL, genotype. -Will follow rec's  Unclear history of lupus anticoagulant -continue Follow up with Hematology As an Outpatient  Code Status: Full  Family Communication: None at bedside  Disposition Plan: will need prolong IV antibiotics at discharge; needs SNF for  it   Procedures  TEE: normal LV function; moderate RAE; s/p TVR with tricuspid stenosis (mean gradient 12 mmHg); mild TR; vegetation on prosthetic TV; no vegetations on aortic, mitral or pulmonic valves.   Consults   ID PCCM CT surgery Cardiology  Medications  Scheduled Meds: . patient's guide to using coumadin book   Does not apply Once  . piperacillin-tazobactam (ZOSYN)  IV  3.375 g Intravenous 3 times per day  . sodium chloride  3 mL Intravenous Q12H  . vancomycin  1,250 mg Intravenous Q8H  . warfarin  7.5 mg Oral ONCE-1800  . [START ON 04/16/2015] warfarin   Does not apply Once  . Warfarin - Pharmacist Dosing Inpatient   Does not apply q1800   Continuous Infusions: . sodium chloride 75 mL/hr at 04/12/15 0815  . heparin 2,800 Units/hr (04/15/15 0600)   PRN Meds:.HYDROmorphone (DILAUDID) injection, oxyCODONE  DVT Prophylaxis   Heparin GTT  Lab Results  Component Value Date   PLT 213 04/15/2015    Antibiotics  Anti-infectives    Start     Dose/Rate Route Frequency Ordered Stop   04/13/15 1400  piperacillin-tazobactam (ZOSYN) IVPB 3.375 g     3.375 g 12.5 mL/hr over 240 Minutes Intravenous 3 times per day 04/13/15 1144     04/13/15 0200  vancomycin (VANCOCIN) 1,250 mg in sodium chloride 0.9 % 250 mL IVPB     1,250 mg 166.7 mL/hr over 90 Minutes Intravenous Every 8 hours 04/12/15 2131     04/11/15 1030  cefTRIAXone (ROCEPHIN) 2 g in dextrose 5 % 50 mL IVPB  Status:  Discontinued     2 g 100 mL/hr over 30 Minutes Intravenous Every 24 hours 04/11/15 1023 04/13/15 1112   04/11/15 0400  piperacillin-tazobactam (ZOSYN) IVPB 3.375 g  Status:  Discontinued     3.375 g 12.5 mL/hr over 240 Minutes Intravenous Every 8 hours 04/10/15 1925 04/11/15 1023   04/11/15 0400  vancomycin (VANCOCIN) IVPB 750 mg/150 ml premix  Status:  Discontinued     750 mg 150 mL/hr over 60 Minutes Intravenous Every 8 hours 04/10/15 1925 04/12/15 2131   04/10/15 1930  vancomycin (VANCOCIN) 1,500 mg  in sodium chloride 0.9 % 500 mL IVPB     1,500 mg 250 mL/hr over 120 Minutes Intravenous  Once 04/10/15 1925 04/10/15 2235   04/10/15 1915  piperacillin-tazobactam (ZOSYN) IVPB 3.375 g     3.375 g 100 mL/hr over 30 Minutes Intravenous  Once 04/10/15 1910 04/10/15 2107   04/10/15 1915  vancomycin (VANCOCIN) IVPB 1000 mg/200 mL premix  Status:  Discontinued     1,000 mg 200 mL/hr over 60 Minutes Intravenous  Once 04/10/15 1910 04/10/15 1925      Objective:   Filed Vitals:   04/14/15 1100 04/14/15 1457 04/14/15 2121 04/15/15 0502  BP: 112/75 107/84 113/79 104/72  Pulse: 84 79 95 87  Temp:  99.4 F (37.4 C) 99.2 F (37.3 C) 98.7 F (37.1 C)  TempSrc:  Oral Oral Oral  Resp: 20 18 20 20   Height:      Weight:      SpO2: 95% 97% 96% 93%  Wt Readings from Last 3 Encounters:  04/14/15 73.029 kg (161 lb)  12/28/13 70.2 kg (154 lb 12.2 oz)     Intake/Output Summary (Last 24 hours) at 04/15/15 1557 Last data filed at 04/15/15 1041  Gross per 24 hour  Intake      0 ml  Output   2000 ml  Net  -2000 ml     Physical Exam Awake Alert, Oriented X 3, afebrile and in no distress. Still complaining of pleuritic CP Supple Neck, No JVD, no thyromegaly Symmetrical Chest wall movement, fair air movement, no wheezing No Gallops, no Rubs or gallops; positive Murmur, S1 and S2 +ve B.Sounds, Abd Soft, No tenderness, No organomegaly appriciated, No rebound - guarding or rigidity. No Cyanosis, Clubbing or edema, No new Rash or bruise     Data Review   Micro Results Recent Results (from the past 240 hour(s))  Blood Culture (routine x 2)     Status: None   Collection Time: 04/10/15  7:30 PM  Result Value Ref Range Status   Specimen Description BLOOD RIGHT ARM  Final   Special Requests BOTTLES DRAWN AEROBIC AND ANAEROBIC 5CC  Final   Culture NO GROWTH 5 DAYS  Final   Report Status 04/15/2015 FINAL  Final  Blood Culture (routine x 2)     Status: None   Collection Time: 04/10/15  7:45  PM  Result Value Ref Range Status   Specimen Description BLOOD LEFT ARM  Final   Special Requests BOTTLES DRAWN AEROBIC AND ANAEROBIC 5CC  Final   Culture NO GROWTH 5 DAYS  Final   Report Status 04/15/2015 FINAL  Final  Urine culture     Status: None   Collection Time: 04/10/15  7:50 PM  Result Value Ref Range Status   Specimen Description URINE, RANDOM  Final   Special Requests NONE  Final   Culture NO GROWTH 1 DAY  Final   Report Status 04/12/2015 FINAL  Final  Culture, blood (routine x 2)     Status: None (Preliminary result)   Collection Time: 04/11/15  1:35 AM  Result Value Ref Range Status   Specimen Description BLOOD ARM LEFT  Final   Special Requests BOTTLES DRAWN AEROBIC ONLY 1CC  Final   Culture NO GROWTH 4 DAYS  Final   Report Status PENDING  Incomplete  Culture, blood (routine x 2)     Status: None (Preliminary result)   Collection Time: 04/11/15  1:42 AM  Result Value Ref Range Status   Specimen Description BLOOD HAND LEFT  Final   Special Requests BOTTLES DRAWN AEROBIC ONLY 1CC  Final   Culture NO GROWTH 4 DAYS  Final   Report Status PENDING  Incomplete    Radiology Reports Dg Chest 2 View  04/10/2015  CLINICAL DATA:  Pain upper right breast towards medial border. 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 Chest pain and shortness breath for 2 days. EXAM: CHEST  2 VIEW COMPARISON:  Chest x-ray dated 12/28/2013. FINDINGS: Heart size is upper normal, not significantly changed compared to the previous exam. Overall cardiomediastinal silhouette is stable in size and configuration. Ill-defined opacities are seen within the right mid lung region and at the left lung base. A focal rounded nodular density overlying a mid thoracic vertebral body on the lateral view may be related to the right mid lung opacity. No pleural effusions seen. No pneumothorax. No acute osseous abnormality. IMPRESSION: 1. Ill-defined opacity within the right mid lung region with an additional smaller  ill-defined  opacity at the left lung base. This could represent asymmetric edema or pneumonia. 2. Focal nodular density overlying a mid thoracic vertebral body on the lateral view, measuring 1.3 cm greatest dimension, which may be related to the right mid lung opacity. A neoplastic process cannot be excluded. Recommend chest CT for further characterization. Electronically Signed   By: Bary Richard M.D.   On: 04/10/2015 18:34   Ct Angio Chest Pe W/cm &/or Wo Cm  04/10/2015  CLINICAL DATA:  Right-sided chest pain for 2 days, shortness of breath, cough, chest pain, IV drug abuse, acute bacterial endocarditis EXAM: CT ANGIOGRAPHY CHEST WITH CONTRAST TECHNIQUE: Multidetector CT imaging of the chest was performed using the standard protocol during bolus administration of intravenous contrast. Multiplanar CT image reconstructions and MIPs were obtained to evaluate the vascular anatomy. CONTRAST:  80mL OMNIPAQUE IOHEXOL 350 MG/ML SOLN COMPARISON:  04/10/2015 chest radiograph FINDINGS: Mediastinum/Nodes: There is filling defect in the left lower lobe pulmonary artery at the hilum extending into numerous branches. Branches supplying the lateral left lower lobe appear totally occluded as this vessel extends into the area of consolidation in the anterior lateral left lower lobe described above. Left upper lobe pulmonary arteries are clear. Right pulmonary arteries are clear. There are numerous mediastinal lymph nodes. Sub carinal adenopathy measures 12 mm and right hilar adenopathy measures 12 mm. Lungs/Pleura: Posteriorly in the right lower lobe superior segment, there is a 5.2. x 3.8 x 4.5 cm well-defined rounded pleural-based abnormality that consist of consolidation mixed with areas of air attenuation. The walls of this lesion are mildly irregular and there is mild surrounding inflammatory change. There are multiple irregular right upper lobe pulmonary nodules, seen on series 5, image number 47, 48, and 59. The largest  of these is seen on image 59 and measures 10 mm. There is consolidation in the anterior inferior medial right middle lobe which appears most consistent with atelectasis. In the left upper lobe there is an irregular 7 mm pulmonary nodule on image number 63. There is a 6 mm nodule in the left upper lobe on image number 45. There is consolidation in the anterior lateral aspect of the left lower lobe at the level of the diaphragm. There is a small right pleural effusion. Upper abdomen: The spleen is only partially imaged but appears possibly enlarged. The liver may also be enlarged. Musculoskeletal: No acute or focally suspicious findings. Review of the MIP images confirms the above findings. IMPRESSION: 1. Acute left-sided pulmonary embolism with pulmonary parenchymal infarction. Septic vs bland emboli considered. 2. Large complex masslike structure right lower lobe. Malignancy is not excluded. However, cavitating pneumonia or pulmonary abscess considered more likely, possibly from septic embolic seeding vs aspiration. 3. Multiple bilateral pulmonary nodules. Differential diagnostic possibilities include metastatic disease or septic emboli. 4. Several enlarged mediastinal lymph nodes could be reactive. Metastatic disease not excluded. Critical Value/emergent results were called by telephone at the time of interpretation on 04/10/2015 at 9:17 pm to Dr. Patria Mane , who verbally acknowledged these results. Electronically Signed   By: Esperanza Heir M.D.   On: 04/10/2015 21:18   CBC  Recent Labs Lab 04/10/15 1935 04/11/15 0520 04/12/15 0140 04/13/15 0130 04/14/15 0426 04/15/15 0243  WBC  --  13.9* 12.9* 9.1 6.5 6.1  HGB  --  10.0* 9.5* 8.7* 8.3* 8.0*  HCT  --  32.2* 30.4* 29.2* 27.1* 26.6*  PLT  --  128* 159 179 213 213  MCV  --  76.3* 75.6* 76.2* 74.9* 75.6*  MCH  --  23.7* 23.6* 22.7* 22.9* 22.7*  MCHC  --  31.1 31.3 29.8* 30.6 30.1  RDW  --  18.5* 18.2* 18.0* 17.9* 17.7*  LYMPHSABS 1.9  --   --   --    --   --   MONOABS 1.4*  --   --   --   --   --   EOSABS 0.0  --   --   --   --   --   BASOSABS 0.0  --   --   --   --   --     Chemistries   Recent Labs Lab 04/10/15 1819 04/10/15 1935 04/11/15 0520 04/12/15 0140 04/13/15 0130  NA 131*  --  134* 137 137  K 4.0  --  4.1 3.7 3.7  CL 97*  --  104 107 108  CO2 24  --  21* 21* 19*  GLUCOSE 141*  --  111* 114* 108*  BUN 9  --  8 9 10   CREATININE 0.85  --  0.77 0.71 0.67  CALCIUM 8.4*  --  7.9* 8.1* 8.1*  AST  --  33  --   --   --   ALT  --  39  --   --   --   ALKPHOS  --  99  --   --   --   BILITOT  --  1.2  --   --   --    ------------------------------------------------------------------------------------------------------------------ estimated creatinine clearance is 92.4 mL/min (by C-G formula based on Cr of 0.67). ------------------------------------------------------------------------------------------------------------------ No results for input(s): HGBA1C in the last 72 hours. ------------------------------------------------------------------------------------------------------------------ No results for input(s): CHOL, HDL, LDLCALC, TRIG, CHOLHDL, LDLDIRECT in the last 72 hours. ------------------------------------------------------------------------------------------------------------------ No results for input(s): TSH, T4TOTAL, T3FREE, THYROIDAB in the last 72 hours.  Invalid input(s): FREET3 ------------------------------------------------------------------------------------------------------------------ No results for input(s): VITAMINB12, FOLATE, FERRITIN, TIBC, IRON, RETICCTPCT in the last 72 hours.  Coagulation profile No results for input(s): INR, PROTIME in the last 168 hours.  No results for input(s): DDIMER in the last 72 hours.  Cardiac Enzymes No results for input(s): CKMB, TROPONINI, MYOGLOBIN in the last 168 hours.  Invalid input(s):  CK ------------------------------------------------------------------------------------------------------------------ Invalid input(s): POCBNP    Time Spent in minutes   30 minutes   Vassie Loll M.D on 04/15/2015 at 3:57 PM  Between 7am to 7pm - Pager - [phone]  After 7pm go to [email or web address] - password [facility]  [facility]   Office  [phone]

## 2015-04-16 LAB — BASIC METABOLIC PANEL
Anion gap: 9 (ref 5–15)
BUN: 8 mg/dL (ref 6–20)
CALCIUM: 8 mg/dL — AB (ref 8.9–10.3)
CO2: 23 mmol/L (ref 22–32)
CREATININE: 0.73 mg/dL (ref 0.44–1.00)
Chloride: 107 mmol/L (ref 101–111)
GFR calc Af Amer: >60 mL/min (ref >60)
GFR calc non Af Amer: >60 mL/min (ref >60)
GLUCOSE: 92 mg/dL (ref 65–99)
Potassium: 3.3 mmol/L — ABNORMAL LOW (ref 3.5–5.1)
Sodium: 139 mmol/L (ref 135–145)

## 2015-04-16 LAB — CBC
HEMATOCRIT: 25.8 % — AB (ref 36.0–46.0)
Hemoglobin: 7.8 g/dL — ABNORMAL LOW (ref 12.0–15.0)
MCH: 23.1 pg — AB (ref 26.0–34.0)
MCHC: 30.2 g/dL (ref 30.0–36.0)
MCV: 76.6 fL — AB (ref 78.0–100.0)
Platelets: 212 10*3/uL (ref 150–400)
RBC: 3.37 MIL/uL — ABNORMAL LOW (ref 3.87–5.11)
RDW: 17.6 % — AB (ref 11.5–15.5)
WBC: 5.5 10*3/uL (ref 4.0–10.5)

## 2015-04-16 LAB — HEPARIN LEVEL (UNFRACTIONATED)
HEPARIN UNFRACTIONATED: 0.54 [IU]/mL (ref 0.30–0.70)
Heparin Unfractionated: 0.23 IU/mL — ABNORMAL LOW (ref 0.30–0.70)
Heparin Unfractionated: 0.72 IU/mL — ABNORMAL HIGH (ref 0.30–0.70)

## 2015-04-16 LAB — CULTURE, BLOOD (ROUTINE X 2)
CULTURE: NO GROWTH
Culture: NO GROWTH

## 2015-04-16 LAB — PROTIME-INR
INR: 1.23 (ref 0.00–1.49)
Prothrombin Time: 15.6 seconds — ABNORMAL HIGH (ref 11.6–15.2)

## 2015-04-16 LAB — VANCOMYCIN, TROUGH: Vancomycin Tr: 24 ug/mL — ABNORMAL HIGH (ref 10.0–20.0)

## 2015-04-16 MED ORDER — WARFARIN SODIUM 7.5 MG PO TABS
7.5000 mg | ORAL_TABLET | Freq: Once | ORAL | Status: AC
Start: 2015-04-16 — End: 2015-04-16
  Administered 2015-04-16: 7.5 mg via ORAL
  Filled 2015-04-16: qty 1

## 2015-04-16 MED ORDER — HYDROMORPHONE HCL 1 MG/ML IJ SOLN
1.0000 mg | INTRAMUSCULAR | Status: DC | PRN
  Administered 2015-04-16: 1 mg via INTRAVENOUS

## 2015-04-16 MED ORDER — HYDROMORPHONE HCL 1 MG/ML IJ SOLN
1.0000 mg | INTRAMUSCULAR | Status: DC | PRN
  Administered 2015-04-16 – 2015-04-19 (×14): 1 mg via INTRAVENOUS
  Filled 2015-04-16 (×14): qty 1

## 2015-04-16 MED ORDER — HEPARIN (PORCINE) IN NACL 100-0.45 UNIT/ML-% IJ SOLN
3000.0000 [IU]/h | INTRAMUSCULAR | Status: DC
  Administered 2015-04-16 – 2015-04-17 (×2): 2900 [IU]/h via INTRAVENOUS
  Administered 2015-04-18 (×2): 3000 [IU]/h via INTRAVENOUS
  Administered 2015-04-18: 2900 [IU]/h via INTRAVENOUS
  Filled 2015-04-16 (×7): qty 250

## 2015-04-16 MED ORDER — SODIUM CHLORIDE 0.9 % IJ SOLN: 10.0000 mL | INTRAMUSCULAR | Status: DC | PRN

## 2015-04-16 MED ORDER — VANCOMYCIN HCL IN DEXTROSE 750-5 MG/150ML-% IV SOLN
750.0000 mg | Freq: Three times a day (TID) | INTRAVENOUS | Status: DC
  Administered 2015-04-16 – 2015-04-18 (×6): 750 mg via INTRAVENOUS
  Filled 2015-04-16 (×7): qty 150

## 2015-04-16 NOTE — Progress Notes (Signed)
Patient lying in bed, no distress, pain meds given. No other needs expressed at this time, call light within reach.

## 2015-04-16 NOTE — Progress Notes (Signed)
Patient has LOG bed for Monday at [facility].  Patient is agreeable to [facility].  Transport scheduled with [facility] for Monday at 1pm- [facility] to pick [facility].  CSW will continue to follow  Merlyn LotJenna Holoman, [facility]SWA Clinical Social Worker [phone]

## 2015-04-16 NOTE — Progress Notes (Signed)
Peripherally Inserted Central Catheter/Midline Placement  The IV Nurse has discussed with the patient and/or persons authorized to consent for the patient, the purpose of this procedure and the potential benefits and risks involved with this procedure.  The benefits include less needle sticks, lab draws from the catheter and patient may be discharged home with the catheter.  Risks include, but not limited to, infection, bleeding, blood clot (thrombus formation), and puncture of an artery; nerve damage and irregular heat beat.  Alternatives to this procedure were also discussed.  PICC/Midline Placement Documentation        Lisabeth DevoidGibbs, Yaneth Fairbairn Jeanette 04/16/2015, 11:06 AM Consent obtained by Reginia FortsMarilyn Lumban, RN

## 2015-04-16 NOTE — Progress Notes (Signed)
ANTICOAGULATION CONSULT NOTE   Pharmacy Consult for heparin Indication: pulmonary embolus   No Known Allergies Patient Measurements: Height: 5\' 4"  (162.6 cm) Weight: 161 lb (73.029 kg) IBW/kg (Calculated) : 54.7 Heparin Dosing Weight: 70kg Vital Signs: Temp: 98.6 F (37 C) (11/04 0441) Temp Source: Oral (11/04 0441) BP: 106/79 mmHg (11/04 0441) Pulse Rate: 85 (11/04 0441) Labs:  Recent Labs  04/14/15 0426 04/15/15 0243 04/15/15 1540 04/16/15 0256  HGB 8.3* 8.0*  --  7.8*  HCT 27.1* 26.6*  --  25.8*  PLT 213 213  --  212  LABPROT  --   --  16.4* 15.6*  INR  --   --  1.31 1.23  HEPARINUNFRC 0.39 0.32  --  0.23*  CREATININE  --   --   --  0.73    Estimated Creatinine Clearance: 92.4 mL/min (by C-G formula based on Cr of 0.73).  Assessment:  39 y.o. female with a PMHx of R sided bacterial endocarditis/tricuspid vegetations, continued IVDU (injects Opana), and anemia, who presented to the ED with complaints of gradual onset chest pain and shortness of breath 2 days. CTA showed possible left sided PE or septic emboli.   Heparin level subtherapeutic on 2800 units/hr. No issues with line or bleeding reported per RN. Hgb down slightly but relatively stable.   Goal of Therapy:  Heparin level 0.3-0.7 units/ml Monitor platelets by anticoagulation protocol: Yes   Plan:  Increase heparin to 3000 units/hr Will f/u 6 hr heparin level  Christoper Fabianaron Aalijah Lanphere, PharmD, BCPS Clinical pharmacist, pager [phone] 04/16/2015 4:49 AM

## 2015-04-16 NOTE — Progress Notes (Addendum)
ANTICOAGULATION CONSULT NOTE   Pharmacy Consult for heparin Indication: pulmonary embolus   No Known Allergies Patient Measurements: Height: 5\' 4"  (162.6 cm) Weight: 161 lb (73.029 kg) IBW/kg (Calculated) : 54.7 Heparin Dosing Weight: 70kg Vital Signs: Temp: 99.6 F (37.6 C) (11/04 1323) Temp Source: Oral (11/04 1323) BP: 112/71 mmHg (11/04 1323) Pulse Rate: 86 (11/04 1323) Labs:  Recent Labs  04/14/15 0426 04/15/15 0243 04/15/15 1540 04/16/15 0256 04/16/15 1450  HGB 8.3* 8.0*  --  7.8*  --   HCT 27.1* 26.6*  --  25.8*  --   PLT 213 213  --  212  --   LABPROT  --   --  16.4* 15.6*  --   INR  --   --  1.31 1.23  --   HEPARINUNFRC 0.39 0.32  --  0.23* 0.54  CREATININE  --   --   --  0.73  --     Estimated Creatinine Clearance: 92.4 mL/min (by C-G formula based on Cr of 0.73).  Assessment: 39 y.o. female with a PMHx of R sided bacterial endocarditis/tricuspid vegetations, continued IVDU (injects Opana), and anemia, who presented to the ED with complaints of gradual onset chest pain and shortness of breath 2 days. CTA showed possible left sided PE or septic emboli.   No issues per RN. Hgb low at 7.8, plts wnl. Consider checking ATIII level if continued on heparin and still subtherapeutic. Last HL is now therapeutic at 0.54. Coumadin started on 11/3. Current INR is subtherapeutic at 1.23. Hgb trended down to 7.8, plts wnl, no bleeding noted   Goal of Therapy:  Heparin level 0.3-0.7 units/ml Monitor platelets by anticoagulation protocol: Yes    Plan:  Continue heparin at 3000 units/hr Give coumadin 7.5mg  PO x 1 Check 6 hr confirmatory HL Monitor daily HL, INR, CBC, s/s of bleed  Enzo BiNathan Batchelder, PharmD Clinical Pharmacist Pager [phone] 04/16/2015 3:42 PM   Addendum:  Confirmatory heparin level is slightly above goal at 0.72. Decrease rate to 2900 units/hr and follow up AM labs.  Louie CasaJennifer Maisy Newport, PharmD, BCPS 04/16/2015, 9:39 PM

## 2015-04-16 NOTE — Progress Notes (Signed)
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  She presented in August 2016 as well with chest pain and TTE without obvious vegetation.  Now with vegetation on TV and septic emboli.  Possible left sided PE.  Also with a history of GBS IE.     TEE confirmed vegetation.  No surgery planned at this time due to high likelihood of reinfection.  Culture remains negative now at 5 days.  No fever, normal WBC.  Picc line placed.   Physical Exam: Constitutional:  Filed Vitals:   04/16/15 1323  BP: 112/71  Pulse: 86  Temp: 99.6 F (37.6 C)  Resp: 19  ; patient appears in NAD Eyes: anicteric HENT: no thrush Respiratory: Normal respiratory effort; CTA B Cardiovascular: Tachy RR  Constitutional: positive for fatigue or negative for fevers and chills Cardiovascular: positive for chest pain Gastrointestinal: negative for diarrhea  Lab Results  Component Value Date   WBC 5.5 04/16/2015   HGB 7.8* 04/16/2015   HCT 25.8* 04/16/2015   MCV 76.6* 04/16/2015   PLT 212 04/16/2015    Lab Results  Component Value Date   CREATININE 0.73 04/16/2015   BUN 8 04/16/2015   NA 139 04/16/2015   K 3.3* 04/16/2015   CL 107 04/16/2015   CO2 23 04/16/2015    Lab Results  Component Value Date   ALT 39 04/10/2015   AST 33 04/10/2015   ALKPHOS 99 04/10/2015     Microbiology: Recent Results (from the past 240 hour(s))  Blood Culture (routine x 2)     Status: None   Collection Time: 04/10/15  7:30 PM  Result Value Ref Range Status   Specimen Description BLOOD RIGHT ARM  Final   Special Requests BOTTLES DRAWN AEROBIC AND ANAEROBIC 5CC  Final     Culture NO GROWTH 5 DAYS  Final   Report Status 04/15/2015 FINAL  Final  Blood Culture (routine x 2)     Status: None   Collection Time: 04/10/15  7:45 PM  Result Value Ref Range Status   Specimen Description BLOOD LEFT ARM  Final   Special Requests BOTTLES DRAWN AEROBIC AND ANAEROBIC 5CC  Final   Culture NO GROWTH 5 DAYS  Final   Report Status 04/15/2015 FINAL  Final  Urine culture     Status: None   Collection Time: 04/10/15  7:50 PM  Result Value Ref Range Status   Specimen Description URINE, RANDOM  Final   Special Requests NONE  Final   Culture NO GROWTH 1 DAY  Final   Report Status 04/12/2015 FINAL  Final  Culture, blood (routine x 2)     Status: None   Collection Time: 04/11/15  1:35 AM  Result Value Ref Range Status   Specimen Description BLOOD ARM LEFT  Final   Special Requests BOTTLES DRAWN AEROBIC ONLY 1CC  Final   Culture NO GROWTH 5 DAYS  Final   Report Status 04/16/2015 FINAL  Final  Culture, blood (routine x 2)     Status: None   Collection Time: 04/11/15  1:42 AM  Result Value Ref Range Status   Specimen Description BLOOD  HAND LEFT  Final   Special Requests BOTTLES DRAWN AEROBIC ONLY 1CC  Final   Culture NO GROWTH 5 DAYS  Final   Report Status 04/16/2015 FINAL  Final    Impression:  1. Prosthetic valve endocarditis 2. Drug abuse 3. Possible pulmonary embolus 4. Septic emboli 5. Recent history of Pseudomonal bacteremia  Plan: 1. Continue with broad antibiotic coverage due to culture negative but with history of Pseudomonas and gram positive.  2. Substance abuse counseling 3. vancomycin + zosyn for 6 weeks through December 10th.   4.  Will need follow up with CVTS for consideration of redo if she is in drug rehab  We will  Arrange follow up in our clinic in 2-3 weeks.    I will sign off, please call with questions.

## 2015-04-16 NOTE — Progress Notes (Signed)
Pharmacy Antibiotic Time-Out Note  Melanie CivatteJoann Verdie Shireheresa Crane is a 39 y.o. year-old female admitted on 04/10/2015.  The patient is currently on vancomycin for sepsis/endocarditis.  Assessment/Plan: Day #7 of vanc and day #4 of Zosyn for sepsis/endocarditis. Known IVDA and prosthetic tricuspid valve vegetation. ID request restart Zosyn for pseudomonal coverage ([place] ID following. Afebrile, WBC wnl. VT this am was supratherapeutic at 24. Next dose had already been given after level drawn. Cultures remain negative.  Decrease vancomycin to 750mg  IV Q8 starting at 1800 tonight Continue Zosyn 3.375 gm IV q8h (4 hour infusion) Monitor clinical picture, renal function, VT at Css F/U C&S, abx deescalation / LOT   Recent Labs Lab 04/12/15 0140 04/13/15 0130 04/14/15 0426 04/15/15 0243 04/16/15 0256  WBC 12.9* 9.1 6.5 6.1 5.5    Recent Labs Lab 04/10/15 1819 04/11/15 0520 04/12/15 0140 04/13/15 0130 04/16/15 0256  CREATININE 0.85 0.77 0.71 0.67 0.73   Estimated Creatinine Clearance: 92.4 mL/min (by C-G formula based on Cr of 0.73). Tmax/24h 98.6  Antimicrobial allergies: n/a  Antimicrobials this admission: Vanc 10/29 >> Zosyn 10/29 >> 10/30; resume 11/1 >> CTX 10/30 >> 11/1  Levels/dose changes this admission: 10/31 VT = 8 (On 750 mg Q8H - dose incr'd) 11/4 VT = 24 (On 1250 mg Q8H - Next dose already given, will adjust for next dose)  Microbiology Results: 10/29 Blood x 2 - neg 10/29 Urine: negative 10/30 Blood x 2 - ng x 4 days  Thank you for allowing pharmacy to be a part of this patient's care.  Enzo BiNathan Audreanna Torrisi, PharmD Clinical Pharmacist Pager [phone] 04/16/2015 8:58 AM

## 2015-04-16 NOTE — Progress Notes (Signed)
Patient Demographics  Melanie Crane, is a 39 y.o. female, DOB - 1976-02-04, VHQ:[id number]  Admit date - 04/10/2015   Admitting Physician Alberteen Sam, MD  Outpatient Primary MD for the patient is No PCP Per Patient  LOS - 6   Chief Complaint  Patient presents with  . Chest Pain  . Shortness of Breath       Admission HPI/Brief narrative: 39 year old female with history of IV drug abuse, with diagnosis of tricuspid valve endocarditis and septic joint at [facility] in July 2015, culture growing group B strep, treated with ORITAVANCIN, patient moved to [place], had tricuspid valve surgery October of last year, records showing she was admitted in April of this year secondary to Pseudomonas endocarditis, treated with cefepime and tobramycin, transitioned to oral levofloxacin on discharge, hospital course was complicated by right upper arm DVT secondary to PICC line, presents 10/29 with complaints of fever, dyspnea and chest pain, workup significant for PE(septic versus bland) and septic pulmonary emboli with cavitating lung lesion, 2-D echo done 10/30 showing mobile vegetation on new  tricuspid valve, patient seen by CT surgery, recommended no surgery at this point. TEE demonstrated vegetation on TV. Will need 6 weeks of IV antibiotics.   Subjective:   Melanie Crane is not having fevers and overall hemodynamically stable. Patient still with pleuritic chest pain and requiring oxygen supplementation. PICC line to be place today. Continue on IV antibiotics as recommended by ID.  Assessment & Plan   Principal Problem:   Acute septic pulmonary embolism without acute cor pulmonale (HCC) Active Problems:   IV drug abuse   Anemia   Thrombocytopenia, unspecified (HCC)   Cigarette smoker   Hepatitis C   Septic embolism (HCC)   Embolism, pulmonary with infarction (HCC)   Abscess of right lung with pneumonia  (HCC)   Sepsis (HCC)   Prosthetic valve endocarditis (HCC)  Pulmonary embolus with pulmonary parenchymal infarction - CTA chest with evidence of Acute left-sided pulmonary embolism with pulmonary parenchymal infarction. Septic vs bland emboli considered. - Pulmonary consult appreciated, they do recommend to continue with anticoagulation, especially they are not able to rule out PE, especially the PE is large enough to fill LLL pulmonary artery and segmental branches which is too large to be just from vegetation. -  bilateral lower extremity venous DopplerNegative for DVT. -will continue IV antibiotics and anticoagulation; will initiate coumadin today  Septic emboli with cavitating lung lesion /endocarditis - CTA chest with evidence of multiple pulmonary nodules and masslike lesion, this is most likely related to septic emboli and lung abscess. - History of endocarditis GBS on  July 2015, and Pseudomonas on April 2016. - ID consult appreciated, continue IV vancomycin and Zosyn (initially on Rocephin which was transitioned back to Zosyn giving her history of Pseudomonas bacteremia ).  - Blood cultures no growth to date, will follow -per ID ok to insert PICC - 2-D echo with mobile vegetation on tricuspid valve(patient status post tricuspid replacement), CT surgery consulted, patient not a candidate for redo surgery at this moment. Continue IV abx's - Cardiology has done TEE and demonstrated positive TV   Anemia - Most likely anemia of chronic disease, hemoglobin trending down but this is most likely dilutional as she is on IV fluids, no signs  of overt bleed.. -will monitor trend   IV drug abuse -cessation counseling provided -HIV nonreactive -will benefit of inpatient treatment (if in agreement)  Hepatitis C -Workup as per ID, Check Hep C VL, genotype. -Will follow rec's  Unclear history of lupus anticoagulant -continue Follow up with Hematology As an Outpatient  Code Status:  Full  Family Communication: None at bedside  Disposition Plan: will need prolong IV antibiotics at discharge; needs SNF for it   Procedures  TEE: normal LV function; moderate RAE; s/p TVR with tricuspid stenosis (mean gradient 12 mmHg); mild TR; vegetation on prosthetic TV; no vegetations on aortic, mitral or pulmonic valves.   Consults   ID PCCM CT surgery Cardiology  Medications  Scheduled Meds: . piperacillin-tazobactam (ZOSYN)  IV  3.375 g Intravenous 3 times per day  . sodium chloride  3 mL Intravenous Q12H  . vancomycin  750 mg Intravenous Q8H  . warfarin  7.5 mg Oral ONCE-1800  . warfarin   Does not apply Once  . Warfarin - Pharmacist Dosing Inpatient   Does not apply q1800   Continuous Infusions: . sodium chloride 75 mL/hr at 04/16/15 0754  . heparin 3,000 Units/hr (04/16/15 0507)   PRN Meds:.HYDROmorphone (DILAUDID) injection, oxyCODONE, sodium chloride  DVT Prophylaxis   Heparin GTT  Lab Results  Component Value Date   PLT 212 04/16/2015    Antibiotics  Anti-infectives    Start     Dose/Rate Route Frequency Ordered Stop   04/16/15 1800  vancomycin (VANCOCIN) IVPB 750 mg/150 ml premix     750 mg 150 mL/hr over 60 Minutes Intravenous Every 8 hours 04/16/15 0844     04/13/15 1400  piperacillin-tazobactam (ZOSYN) IVPB 3.375 g     3.375 g 12.5 mL/hr over 240 Minutes Intravenous 3 times per day 04/13/15 1144     04/13/15 0200  vancomycin (VANCOCIN) 1,250 mg in sodium chloride 0.9 % 250 mL IVPB  Status:  Discontinued     1,250 mg 166.7 mL/hr over 90 Minutes Intravenous Every 8 hours 04/12/15 2131 04/16/15 0844   04/11/15 1030  cefTRIAXone (ROCEPHIN) 2 g in dextrose 5 % 50 mL IVPB  Status:  Discontinued     2 g 100 mL/hr over 30 Minutes Intravenous Every 24 hours 04/11/15 1023 04/13/15 1112   04/11/15 0400  piperacillin-tazobactam (ZOSYN) IVPB 3.375 g  Status:  Discontinued     3.375 g 12.5 mL/hr over 240 Minutes Intravenous Every 8 hours 04/10/15 1925  04/11/15 1023   04/11/15 0400  vancomycin (VANCOCIN) IVPB 750 mg/150 ml premix  Status:  Discontinued     750 mg 150 mL/hr over 60 Minutes Intravenous Every 8 hours 04/10/15 1925 04/12/15 2131   04/10/15 1930  vancomycin (VANCOCIN) 1,500 mg in sodium chloride 0.9 % 500 mL IVPB     1,500 mg 250 mL/hr over 120 Minutes Intravenous  Once 04/10/15 1925 04/10/15 2235   04/10/15 1915  piperacillin-tazobactam (ZOSYN) IVPB 3.375 g     3.375 g 100 mL/hr over 30 Minutes Intravenous  Once 04/10/15 1910 04/10/15 2107   04/10/15 1915  vancomycin (VANCOCIN) IVPB 1000 mg/200 mL premix  Status:  Discontinued     1,000 mg 200 mL/hr over 60 Minutes Intravenous  Once 04/10/15 1910 04/10/15 1925      Objective:   Filed Vitals:   04/14/15 2121 04/15/15 0502 04/15/15 2225 04/16/15 0441  BP: 113/79 104/72 116/75 106/79  Pulse: 95 87 96 85  Temp: 99.2 F (37.3 C) 98.7 F (37.1 C)  98.2 F (36.8 C) 98.6 F (37 C)  TempSrc: Oral Oral Oral Oral  Resp: 20 20 20 18   Height:      Weight:      SpO2: 96% 93% 97% 97%    Wt Readings from Last 3 Encounters:  04/14/15 73.029 kg (161 lb)  12/28/13 70.2 kg (154 lb 12.2 oz)     Intake/Output Summary (Last 24 hours) at 04/16/15 1232 Last data filed at 04/16/15 0854  Gross per 24 hour  Intake    120 ml  Output   1200 ml  Net  -1080 ml     Physical Exam Awake Alert, Oriented X 3, afebrile and in no distress. Still complaining of pleuritic CP (worse with movements and deep breath) Supple Neck, No JVD, no thyromegaly Symmetrical Chest wall movement, fair air movement, no wheezing No Gallops, no Rubs or gallops; positive Murmur, S1 and S2 +ve B.Sounds, Abd Soft, No tenderness, No organomegaly appriciated, No rebound - guarding or rigidity. No Cyanosis, Clubbing or edema, No new Rash or bruise     Data Review   Micro Results Recent Results (from the past 240 hour(s))  Blood Culture (routine x 2)     Status: None   Collection Time: 04/10/15  7:30 PM   Result Value Ref Range Status   Specimen Description BLOOD RIGHT ARM  Final   Special Requests BOTTLES DRAWN AEROBIC AND ANAEROBIC 5CC  Final   Culture NO GROWTH 5 DAYS  Final   Report Status 04/15/2015 FINAL  Final  Blood Culture (routine x 2)     Status: None   Collection Time: 04/10/15  7:45 PM  Result Value Ref Range Status   Specimen Description BLOOD LEFT ARM  Final   Special Requests BOTTLES DRAWN AEROBIC AND ANAEROBIC 5CC  Final   Culture NO GROWTH 5 DAYS  Final   Report Status 04/15/2015 FINAL  Final  Urine culture     Status: None   Collection Time: 04/10/15  7:50 PM  Result Value Ref Range Status   Specimen Description URINE, RANDOM  Final   Special Requests NONE  Final   Culture NO GROWTH 1 DAY  Final   Report Status 04/12/2015 FINAL  Final  Culture, blood (routine x 2)     Status: None (Preliminary result)   Collection Time: 04/11/15  1:35 AM  Result Value Ref Range Status   Specimen Description BLOOD ARM LEFT  Final   Special Requests BOTTLES DRAWN AEROBIC ONLY 1CC  Final   Culture NO GROWTH 4 DAYS  Final   Report Status PENDING  Incomplete  Culture, blood (routine x 2)     Status: None (Preliminary result)   Collection Time: 04/11/15  1:42 AM  Result Value Ref Range Status   Specimen Description BLOOD HAND LEFT  Final   Special Requests BOTTLES DRAWN AEROBIC ONLY 1CC  Final   Culture NO GROWTH 4 DAYS  Final   Report Status PENDING  Incomplete    Radiology Reports Dg Chest 2 View  04/10/2015  CLINICAL DATA:  Pain upper right breast towards medial border. 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 Chest pain and shortness breath for 2 days. EXAM: CHEST  2 VIEW COMPARISON:  Chest x-ray dated 12/28/2013. FINDINGS: Heart size is upper normal, not significantly changed compared to the previous exam. Overall cardiomediastinal silhouette is stable in size and configuration. Ill-defined opacities are seen within the right mid lung region and at the left lung base. A focal  rounded nodular  density overlying a mid thoracic vertebral body on the lateral view may be related to the right mid lung opacity. No pleural effusions seen. No pneumothorax. No acute osseous abnormality. IMPRESSION: 1. Ill-defined opacity within the right mid lung region with an additional smaller ill-defined opacity at the left lung base. This could represent asymmetric edema or pneumonia. 2. Focal nodular density overlying a mid thoracic vertebral body on the lateral view, measuring 1.3 cm greatest dimension, which may be related to the right mid lung opacity. A neoplastic process cannot be excluded. Recommend chest CT for further characterization. Electronically Signed   By: Bary Richard M.D.   On: 04/10/2015 18:34   Ct Angio Chest Pe W/cm &/or Wo Cm  04/10/2015  CLINICAL DATA:  Right-sided chest pain for 2 days, shortness of breath, cough, chest pain, IV drug abuse, acute bacterial endocarditis EXAM: CT ANGIOGRAPHY CHEST WITH CONTRAST TECHNIQUE: Multidetector CT imaging of the chest was performed using the standard protocol during bolus administration of intravenous contrast. Multiplanar CT image reconstructions and MIPs were obtained to evaluate the vascular anatomy. CONTRAST:  80mL OMNIPAQUE IOHEXOL 350 MG/ML SOLN COMPARISON:  04/10/2015 chest radiograph FINDINGS: Mediastinum/Nodes: There is filling defect in the left lower lobe pulmonary artery at the hilum extending into numerous branches. Branches supplying the lateral left lower lobe appear totally occluded as this vessel extends into the area of consolidation in the anterior lateral left lower lobe described above. Left upper lobe pulmonary arteries are clear. Right pulmonary arteries are clear. There are numerous mediastinal lymph nodes. Sub carinal adenopathy measures 12 mm and right hilar adenopathy measures 12 mm. Lungs/Pleura: Posteriorly in the right lower lobe superior segment, there is a 5.2. x 3.8 x 4.5 cm well-defined rounded pleural-based  abnormality that consist of consolidation mixed with areas of air attenuation. The walls of this lesion are mildly irregular and there is mild surrounding inflammatory change. There are multiple irregular right upper lobe pulmonary nodules, seen on series 5, image number 47, 48, and 59. The largest of these is seen on image 59 and measures 10 mm. There is consolidation in the anterior inferior medial right middle lobe which appears most consistent with atelectasis. In the left upper lobe there is an irregular 7 mm pulmonary nodule on image number 63. There is a 6 mm nodule in the left upper lobe on image number 45. There is consolidation in the anterior lateral aspect of the left lower lobe at the level of the diaphragm. There is a small right pleural effusion. Upper abdomen: The spleen is only partially imaged but appears possibly enlarged. The liver may also be enlarged. Musculoskeletal: No acute or focally suspicious findings. Review of the MIP images confirms the above findings. IMPRESSION: 1. Acute left-sided pulmonary embolism with pulmonary parenchymal infarction. Septic vs bland emboli considered. 2. Large complex masslike structure right lower lobe. Malignancy is not excluded. However, cavitating pneumonia or pulmonary abscess considered more likely, possibly from septic embolic seeding vs aspiration. 3. Multiple bilateral pulmonary nodules. Differential diagnostic possibilities include metastatic disease or septic emboli. 4. Several enlarged mediastinal lymph nodes could be reactive. Metastatic disease not excluded. Critical Value/emergent results were called by telephone at the time of interpretation on 04/10/2015 at 9:17 pm to Dr. Patria Mane , who verbally acknowledged these results. Electronically Signed   By: Esperanza Heir M.D.   On: 04/10/2015 21:18   CBC  Recent Labs Lab 04/10/15 1935  04/12/15 0140 04/13/15 0130 04/14/15 0426 04/15/15 0243 04/16/15 0256  WBC  --   < >  12.9* 9.1 6.5 6.1 5.5   HGB  --   < > 9.5* 8.7* 8.3* 8.0* 7.8*  HCT  --   < > 30.4* 29.2* 27.1* 26.6* 25.8*  PLT  --   < > 159 179 213 213 212  MCV  --   < > 75.6* 76.2* 74.9* 75.6* 76.6*  MCH  --   < > 23.6* 22.7* 22.9* 22.7* 23.1*  MCHC  --   < > 31.3 29.8* 30.6 30.1 30.2  RDW  --   < > 18.2* 18.0* 17.9* 17.7* 17.6*  LYMPHSABS 1.9  --   --   --   --   --   --   MONOABS 1.4*  --   --   --   --   --   --   EOSABS 0.0  --   --   --   --   --   --   BASOSABS 0.0  --   --   --   --   --   --   < > = values in this interval not displayed.  Chemistries   Recent Labs Lab 04/10/15 1819 04/10/15 1935 04/11/15 0520 04/12/15 0140 04/13/15 0130 04/16/15 0256  NA 131*  --  134* 137 137 139  K 4.0  --  4.1 3.7 3.7 3.3*  CL 97*  --  104 107 108 107  CO2 24  --  21* 21* 19* 23  GLUCOSE 141*  --  111* 114* 108* 92  BUN 9  --  CREATININE 0.85  --  0.77 0.71 0.67 0.73  CALCIUM 8.4*  --  7.9* 8.1* 8.1* 8.0*  AST  --  33  --   --   --   --   ALT  --  39  --   --   --   --   ALKPHOS  --  99  --   --   --   --   BILITOT  --  1.2  --   --   --   --    ------------------------------------------------------------------------------------------------------------------ estimated creatinine clearance is 92.4 mL/min (by C-G formula based on Cr of 0.73). ------------------------------------------------------------------------------------------------------------------ No results for input(s): HGBA1C in the last 72 hours. ------------------------------------------------------------------------------------------------------------------ No results for input(s): CHOL, HDL, LDLCALC, TRIG, CHOLHDL, LDLDIRECT in the last 72 hours. ------------------------------------------------------------------------------------------------------------------ No results for input(s): TSH, T4TOTAL, T3FREE, THYROIDAB in the last 72 hours.  Invalid input(s):  FREET3 ------------------------------------------------------------------------------------------------------------------ No results for input(s): VITAMINB12, FOLATE, FERRITIN, TIBC, IRON, RETICCTPCT in the last 72 hours.  Coagulation profile  Recent Labs Lab 04/15/15 1540 04/16/15 0256  INR 1.31 1.23    No results for input(s): DDIMER in the last 72 hours.  Cardiac Enzymes No results for input(s): CKMB, TROPONINI, MYOGLOBIN in the last 168 hours.  Invalid input(s): CK ------------------------------------------------------------------------------------------------------------------ Invalid input(s): POCBNP    Time Spent in minutes   30 minutes   Vassie Loll M.D on 04/16/2015 at 12:32 PM  Between 7am to 7pm - Pager - [phone]  After 7pm go to [email or web address] - password [facility]  [facility]   Office  [phone]

## 2015-04-17 DIAGNOSIS — D638 Anemia in other chronic diseases classified elsewhere: Secondary | ICD-10-CM

## 2015-04-17 LAB — PROTIME-INR
INR: 1.34 (ref 0.00–1.49)
Prothrombin Time: 16.7 seconds — ABNORMAL HIGH (ref 11.6–15.2)

## 2015-04-17 LAB — CBC
HCT: 25.5 % — ABNORMAL LOW (ref 36.0–46.0)
Hemoglobin: 7.5 g/dL — ABNORMAL LOW (ref 12.0–15.0)
MCH: 22.6 pg — AB (ref 26.0–34.0)
MCHC: 29.4 g/dL — AB (ref 30.0–36.0)
MCV: 76.8 fL — AB (ref 78.0–100.0)
PLATELETS: 236 10*3/uL (ref 150–400)
RBC: 3.32 MIL/uL — AB (ref 3.87–5.11)
RDW: 17.7 % — ABNORMAL HIGH (ref 11.5–15.5)
WBC: 5.1 10*3/uL (ref 4.0–10.5)

## 2015-04-17 LAB — HEPARIN LEVEL (UNFRACTIONATED)
HEPARIN UNFRACTIONATED: 0.47 [IU]/mL (ref 0.30–0.70)
Heparin Unfractionated: 0.34 IU/mL (ref 0.30–0.70)

## 2015-04-17 MED ORDER — WARFARIN SODIUM 7.5 MG PO TABS
7.5000 mg | ORAL_TABLET | Freq: Once | ORAL | Status: AC
  Administered 2015-04-17: 7.5 mg via ORAL
  Filled 2015-04-17: qty 1

## 2015-04-17 NOTE — Progress Notes (Signed)
ANTICOAGULATION CONSULT NOTE   Pharmacy Consult for heparin Indication: pulmonary embolus   No Known Allergies Patient Measurements: Height: 5\' 4"  (162.6 cm) Weight: 161 lb (73.029 kg) IBW/kg (Calculated) : 54.7 Heparin Dosing Weight: 70kg Vital Signs: Temp: 98.5 F (36.9 C) (11/05 0436) Temp Source: Oral (11/05 0436) BP: 99/75 mmHg (11/05 0436) Pulse Rate: 79 (11/05 0436) Labs:  Recent Labs  04/15/15 0243 04/15/15 1540 04/16/15 0256 04/16/15 1450 04/16/15 2114 04/17/15 0425  HGB 8.0*  --  7.8*  --   --  7.5*  HCT 26.6*  --  25.8*  --   --  25.5*  PLT 213  --  212  --   --  236  LABPROT  --  16.4* 15.6*  --   --  16.7*  INR  --  1.31 1.23  --   --  1.34  HEPARINUNFRC 0.32  --  0.23* 0.54 0.72* 0.34  CREATININE  --   --  0.73  --   --   --     Estimated Creatinine Clearance: 92.4 mL/min (by C-G formula based on Cr of 0.73).  Assessment: 39 y.o. female with a PMHx of R sided bacterial endocarditis/tricuspid vegetations, continued IVDU (injects Opana), and anemia, who presented to the ED with complaints of gradual onset chest pain and shortness of breath 2 days. CTA showed possible left sided PE or septic emboli.   HL now therapeutic (0.34) on 2900 units/h Coumadin started on 11/3. Current INR is subtherapeutic at 1.34. Hgb trended down to 7.5, plts wnl, no bleeding noted   Goal of Therapy:  Heparin level 0.3-0.7 units/ml Monitor platelets by anticoagulation protocol: Yes    Plan:  Heparin @ 2900 units/h F/u warfarin dose for today Check 6 hr confirmatory HL Monitor daily HL, INR, CBC, s/s of bleed  Babs BertinHaley Harriet Bollen, PharmD Clinical Pharmacist Pager [phone] 04/17/2015 7:28 AM

## 2015-04-17 NOTE — Progress Notes (Signed)
ANTICOAGULATION CONSULT NOTE   Pharmacy Consult for heparin Indication: pulmonary embolus   No Known Allergies Patient Measurements: Height: 5\' 4"  (162.6 cm) Weight: 161 lb (73.029 kg) IBW/kg (Calculated) : 54.7 Heparin Dosing Weight: 70kg Vital Signs: Temp: 98.5 F (36.9 C) (11/05 0436) Temp Source: Oral (11/05 0436) BP: 99/75 mmHg (11/05 0436) Pulse Rate: 79 (11/05 0436) Labs:  Recent Labs  04/15/15 0243 04/15/15 1540 04/16/15 0256  04/16/15 2114 04/17/15 0425 04/17/15 1030  HGB 8.0*  --  7.8*  --   --  7.5*  --   HCT 26.6*  --  25.8*  --   --  25.5*  --   PLT 213  --  212  --   --  236  --   LABPROT  --  16.4* 15.6*  --   --  16.7*  --   INR  --  1.31 1.23  --   --  1.34  --   HEPARINUNFRC 0.32  --  0.23*  < > 0.72* 0.34 0.47  CREATININE  --   --  0.73  --   --   --   --   < > = values in this interval not displayed.  Estimated Creatinine Clearance: 92.4 mL/min (by C-G formula based on Cr of 0.73).  Assessment: 39 y.o. female with a PMHx of R sided bacterial endocarditis/tricuspid vegetations, continued IVDU (injects Opana), and anemia, who presented to the ED with complaints of gradual onset chest pain and shortness of breath 2 days. CTA showed possible left sided PE or septic emboli.   No issues per RN. Hgb low at 7.5, plts wnl. Last two HL were therapeutic including last one at 0.47. Coumadin started on 11/3. Current INR is subtherapeutic but trending up to 1.34.   Goal of Therapy:  Heparin level 0.3-0.7 units/ml Monitor platelets by anticoagulation protocol: Yes    Plan:  Continue heparin gtt at 2,900 units/hr (*High rate ~40 units/kg/hr) Give coumadin 7.5mg  PO x 1 tonight Monitor daily INR, HL, CBC, s/s of bleed  Enzo BiNathan Demetrius Barrell, PharmD Clinical Pharmacist Pager [phone] 04/17/2015 11:19 AM

## 2015-04-17 NOTE — Discharge Instructions (Signed)

## 2015-04-17 NOTE — Progress Notes (Signed)
Patient Demographics  Melanie Crane, is a 39 y.o. female, DOB - 1975/08/15, RUE:[id number]  Admit date - 04/10/2015   Admitting Physician Alberteen Sam, MD  Outpatient Primary MD for the patient is No PCP Per Patient  LOS - 7   Chief Complaint  Patient presents with  . Chest Pain  . 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 TEE demonstrated vegetation on TV. Will need 6 weeks of IV antibiotics.   Subjective:   Kathrynne Kulinski is not having fevers and overall hemodynamically stable. Patient still with pleuritic chest pain, but control with current analgesic regimen. PICC line in place now. Continue on IV antibiotics as recommended by ID.  Assessment & Plan   Principal Problem:   Acute septic pulmonary embolism without acute cor pulmonale (HCC) Active Problems:   IV drug abuse   Anemia   Thrombocytopenia, unspecified (HCC)   Cigarette smoker   Hepatitis C   Septic embolism (HCC)   Embolism, pulmonary with infarction (HCC)   Abscess of right lung with pneumonia  (HCC)   Sepsis (HCC)   Prosthetic valve endocarditis (HCC)  Pulmonary embolus with pulmonary parenchymal infarction - CTA chest with evidence of Acute left-sided pulmonary embolism with pulmonary parenchymal infarction. Septic vs bland emboli considered. - Pulmonary consult appreciated, they do recommend to continue with anticoagulation, especially they are not able to rule out PE, especially the PE is large enough to fill LLL pulmonary artery and segmental branches which is too large to be just from vegetation. -bilateral lower extremity venous Doppler Negative for DVT. -will continue IV antibiotics and anticoagulation; will initiate coumadin today  Septic emboli with cavitating lung lesion /endocarditis - CTA chest with evidence of multiple pulmonary nodules and masslike lesion, this is most likely related to septic emboli and lung abscess. - History of endocarditis GBS on  July 2015, and Pseudomonas on April 2016. - ID consult appreciated, continue IV vancomycin and Zosyn (initially on Rocephin which was transitioned back to Zosyn giving her history of Pseudomonas bacteremia ).  -Blood cultures no growth to date, will follow -PICC in place - 2-D echo nad TEE with mobile vegetation on tricuspid valve(patient status post tricuspid replacement), CT surgery consulted, patient not a candidate for redo surgery at this moment. Continue IV abx's  Anemia - Most likely anemia of chronic disease, hemoglobin trending down but this is most likely dilutional as she is on IV fluids, no signs of overt bleed.. -will monitor trend   IV drug abuse -cessation  counseling provided -HIV nonreactive -will benefit of inpatient treatment (if in agreement)  Hepatitis C -Workup as per ID -Will follow rec's  Unclear history of lupus anticoagulant -continue Follow up with Hematology As an Outpatient  Code Status: Full  Family Communication: None at bedside  Disposition Plan: will need prolong IV  antibiotics for 6 weeks at discharge; needs SNF for it. Antibiotics until 05/22/15   Procedures  TEE: normal LV function; moderate RAE; s/p TVR with tricuspid stenosis (mean gradient 12 mmHg); mild TR; vegetation on prosthetic TV; no vegetations on aortic, mitral or pulmonic valves.   Consults   ID PCCM CT surgery Cardiology  Medications  Scheduled Meds: . piperacillin-tazobactam (ZOSYN)  IV  3.375 g Intravenous 3 times per day  . sodium chloride  3 mL Intravenous Q12H  . vancomycin  750 mg Intravenous Q8H  . warfarin  7.5 mg Oral ONCE-1800  . Warfarin - Pharmacist Dosing Inpatient   Does not apply q1800   Continuous Infusions: . sodium chloride 75 mL/hr at 04/17/15 0135  . heparin 2,900 Units/hr (04/16/15 2348)   PRN Meds:.HYDROmorphone (DILAUDID) injection, oxyCODONE, sodium chloride  DVT Prophylaxis   Heparin GTT  Lab Results  Component Value Date   PLT 236 04/17/2015    Antibiotics  Anti-infectives    Start     Dose/Rate Route Frequency Ordered Stop   04/16/15 1800  vancomycin (VANCOCIN) IVPB 750 mg/150 ml premix     750 mg 150 mL/hr over 60 Minutes Intravenous Every 8 hours 04/16/15 0844     04/13/15 1400  piperacillin-tazobactam (ZOSYN) IVPB 3.375 g     3.375 g 12.5 mL/hr over 240 Minutes Intravenous 3 times per day 04/13/15 1144     04/13/15 0200  vancomycin (VANCOCIN) 1,250 mg in sodium chloride 0.9 % 250 mL IVPB  Status:  Discontinued     1,250 mg 166.7 mL/hr over 90 Minutes Intravenous Every 8 hours 04/12/15 2131 04/16/15 0844   04/11/15 1030  cefTRIAXone (ROCEPHIN) 2 g in dextrose 5 % 50 mL IVPB  Status:  Discontinued     2 g 100 mL/hr over 30 Minutes Intravenous Every 24 hours 04/11/15 1023 04/13/15 1112   04/11/15 0400  piperacillin-tazobactam (ZOSYN) IVPB 3.375 g  Status:  Discontinued     3.375 g 12.5 mL/hr over 240 Minutes Intravenous Every 8 hours 04/10/15 1925 04/11/15 1023   04/11/15 0400  vancomycin (VANCOCIN) IVPB 750 mg/150 ml premix  Status:   Discontinued     750 mg 150 mL/hr over 60 Minutes Intravenous Every 8 hours 04/10/15 1925 04/12/15 2131   04/10/15 1930  vancomycin (VANCOCIN) 1,500 mg in sodium chloride 0.9 % 500 mL IVPB     1,500 mg 250 mL/hr over 120 Minutes Intravenous  Once 04/10/15 1925 04/10/15 2235   04/10/15 1915  piperacillin-tazobactam (ZOSYN) IVPB 3.375 g     3.375 g 100 mL/hr over 30 Minutes Intravenous  Once 04/10/15 1910 04/10/15 2107   04/10/15 1915  vancomycin (VANCOCIN) IVPB 1000 mg/200 mL premix  Status:  Discontinued     1,000 mg 200 mL/hr over 60 Minutes Intravenous  Once 04/10/15 1910 04/10/15 1925      Objective:   Filed Vitals:   04/16/15 1323 04/16/15 2054 04/17/15 0436 04/17/15 1452  BP: 112/71 108/77 99/75 124/95  Pulse: 86 87 79 81  Temp: 99.6 F (37.6 C) 99.1 F (37.3 C) 98.5 F (36.9 C) 98.3 F (36.8 C)  TempSrc: Oral Oral Oral Oral  Resp: 17  Height:      Weight:      SpO2: 98% 94% 94% 95%    Wt Readings from Last 3 Encounters:  04/14/15 73.029 kg (161 lb)  12/28/13 70.2 kg (154 lb 12.2 oz)     Intake/Output Summary (Last 24 hours) at 04/17/15 1504 Last data filed at 04/17/15 1452  Gross per 24 hour  Intake    510 ml  Output   1600 ml  Net  -1090 ml     Physical Exam Awake Alert, Oriented X 3, in no major distress. Still complaining of pleuritic CP (worse with movements and deep breath; but reports current analgesic regimen helping controlling pain). No fever Supple Neck, No JVD, no thyromegaly Symmetrical Chest wall movement, fair air movement, no wheezing No Gallops, no Rubs or gallops; positive Murmur, S1 and S2 +ve B.Sounds, Abd Soft, No tenderness, No organomegaly appriciated, No rebound - guarding or rigidity. No Cyanosis, Clubbing or edema, No new Rash or bruise     Data Review   Micro Results Recent Results (from the past 240 hour(s))  Blood Culture (routine x 2)     Status: None   Collection Time: 04/10/15  7:30 PM  Result Value Ref  Range Status   Specimen Description BLOOD RIGHT ARM  Final   Special Requests BOTTLES DRAWN AEROBIC AND ANAEROBIC 5CC  Final   Culture NO GROWTH 5 DAYS  Final   Report Status 04/15/2015 FINAL  Final  Blood Culture (routine x 2)     Status: None   Collection Time: 04/10/15  7:45 PM  Result Value Ref Range Status   Specimen Description BLOOD LEFT ARM  Final   Special Requests BOTTLES DRAWN AEROBIC AND ANAEROBIC 5CC  Final   Culture NO GROWTH 5 DAYS  Final   Report Status 04/15/2015 FINAL  Final  Urine culture     Status: None   Collection Time: 04/10/15  7:50 PM  Result Value Ref Range Status   Specimen Description URINE, RANDOM  Final   Special Requests NONE  Final   Culture NO GROWTH 1 DAY  Final   Report Status 04/12/2015 FINAL  Final  Culture, blood (routine x 2)     Status: None   Collection Time: 04/11/15  1:35 AM  Result Value Ref Range Status   Specimen Description BLOOD ARM LEFT  Final   Special Requests BOTTLES DRAWN AEROBIC ONLY 1CC  Final   Culture NO GROWTH 5 DAYS  Final   Report Status 04/16/2015 FINAL  Final  Culture, blood (routine x 2)     Status: None   Collection Time: 04/11/15  1:42 AM  Result Value Ref Range Status   Specimen Description BLOOD HAND LEFT  Final   Special Requests BOTTLES DRAWN AEROBIC ONLY 1CC  Final   Culture NO GROWTH 5 DAYS  Final   Report Status 04/16/2015 FINAL  Final    Radiology Reports Dg Chest 2 View  04/10/2015  CLINICAL DATA:  Pain upper right breast towards medial border. 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 Chest pain and shortness breath for 2 days. EXAM: CHEST  2 VIEW COMPARISON:  Chest x-ray dated 12/28/2013. FINDINGS: Heart size is upper normal, not significantly changed compared to the previous exam. Overall cardiomediastinal silhouette is stable in size and configuration. Ill-defined opacities are seen within the right mid lung region and at the left lung base. A focal rounded nodular density overlying a mid thoracic vertebral  body on the lateral view may be related  to the right mid lung opacity. No pleural effusions seen. No pneumothorax. No acute osseous abnormality. IMPRESSION: 1. Ill-defined opacity within the right mid lung region with an additional smaller ill-defined opacity at the left lung base. This could represent asymmetric edema or pneumonia. 2. Focal nodular density overlying a mid thoracic vertebral body on the lateral view, measuring 1.3 cm greatest dimension, which may be related to the right mid lung opacity. A neoplastic process cannot be excluded. Recommend chest CT for further characterization. Electronically Signed   By: Bary Richard M.D.   On: 04/10/2015 18:34   Ct Angio Chest Pe W/cm &/or Wo Cm  04/10/2015  CLINICAL DATA:  Right-sided chest pain for 2 days, shortness of breath, cough, chest pain, IV drug abuse, acute bacterial endocarditis EXAM: CT ANGIOGRAPHY CHEST WITH CONTRAST TECHNIQUE: Multidetector CT imaging of the chest was performed using the standard protocol during bolus administration of intravenous contrast. Multiplanar CT image reconstructions and MIPs were obtained to evaluate the vascular anatomy. CONTRAST:  80mL OMNIPAQUE IOHEXOL 350 MG/ML SOLN COMPARISON:  04/10/2015 chest radiograph FINDINGS: Mediastinum/Nodes: There is filling defect in the left lower lobe pulmonary artery at the hilum extending into numerous branches. Branches supplying the lateral left lower lobe appear totally occluded as this vessel extends into the area of consolidation in the anterior lateral left lower lobe described above. Left upper lobe pulmonary arteries are clear. Right pulmonary arteries are clear. There are numerous mediastinal lymph nodes. Sub carinal adenopathy measures 12 mm and right hilar adenopathy measures 12 mm. Lungs/Pleura: Posteriorly in the right lower lobe superior segment, there is a 5.2. x 3.8 x 4.5 cm well-defined rounded pleural-based abnormality that consist of consolidation mixed with areas  of air attenuation. The walls of this lesion are mildly irregular and there is mild surrounding inflammatory change. There are multiple irregular right upper lobe pulmonary nodules, seen on series 5, image number 47, 48, and 59. The largest of these is seen on image 59 and measures 10 mm. There is consolidation in the anterior inferior medial right middle lobe which appears most consistent with atelectasis. In the left upper lobe there is an irregular 7 mm pulmonary nodule on image number 63. There is a 6 mm nodule in the left upper lobe on image number 45. There is consolidation in the anterior lateral aspect of the left lower lobe at the level of the diaphragm. There is a small right pleural effusion. Upper abdomen: The spleen is only partially imaged but appears possibly enlarged. The liver may also be enlarged. Musculoskeletal: No acute or focally suspicious findings. Review of the MIP images confirms the above findings. IMPRESSION: 1. Acute left-sided pulmonary embolism with pulmonary parenchymal infarction. Septic vs bland emboli considered. 2. Large complex masslike structure right lower lobe. Malignancy is not excluded. However, cavitating pneumonia or pulmonary abscess considered more likely, possibly from septic embolic seeding vs aspiration. 3. Multiple bilateral pulmonary nodules. Differential diagnostic possibilities include metastatic disease or septic emboli. 4. Several enlarged mediastinal lymph nodes could be reactive. Metastatic disease not excluded. Critical Value/emergent results were called by telephone at the time of interpretation on 04/10/2015 at 9:17 pm to Dr. Patria Mane , who verbally acknowledged these results. Electronically Signed   By: Esperanza Heir M.D.   On: 04/10/2015 21:18   CBC  Recent Labs Lab 04/10/15 1935  04/13/15 0130 04/14/15 0426 04/15/15 0243 04/16/15 0256 04/17/15 0425  WBC  --   < > 9.1 6.5 6.1 5.5 5.1  HGB  --   < >  8.7* 8.3* 8.0* 7.8* 7.5*  HCT  --   < >  29.2* 27.1* 26.6* 25.8* 25.5*  PLT  --   < > 179 213 213 212 236  MCV  --   < > 76.2* 74.9* 75.6* 76.6* 76.8*  MCH  --   < > 22.7* 22.9* 22.7* 23.1* 22.6*  MCHC  --   < > 29.8* 30.6 30.1 30.2 29.4*  RDW  --   < > 18.0* 17.9* 17.7* 17.6* 17.7*  LYMPHSABS 1.9  --   --   --   --   --   --   MONOABS 1.4*  --   --   --   --   --   --   EOSABS 0.0  --   --   --   --   --   --   BASOSABS 0.0  --   --   --   --   --   --   < > = values in this interval not displayed.  Chemistries   Recent Labs Lab 04/10/15 1819 04/10/15 1935 04/11/15 0520 04/12/15 0140 04/13/15 0130 04/16/15 0256  NA 131*  --  134* 137 137 139  K 4.0  --  4.1 3.7 3.7 3.3*  CL 97*  --  104 107 108 107  CO2 24  --  21* 21* 19* 23  GLUCOSE 141*  --  111* 114* 108* 92  BUN 9  --  8 9 10 8   CREATININE 0.85  --  0.77 0.71 0.67 0.73  CALCIUM 8.4*  --  7.9* 8.1* 8.1* 8.0*  AST  --  33  --   --   --   --   ALT  --  39  --   --   --   --   ALKPHOS  --  99  --   --   --   --   BILITOT  --  1.2  --   --   --   --    Coagulation profile  Recent Labs Lab 04/15/15 1540 04/16/15 0256 04/17/15 0425  INR 1.31 1.23 1.34    ----------------------------------------------------------------------------------------------------------------- Invalid input(s): POCBNP    Time Spent in minutes   30 minutes   Vassie Loll M.D on 04/17/2015 at 3:04 PM  Between 7am to 7pm - Pager - [phone]  After 7pm go to [email or web address] - password [facility]  [facility]   Office  [phone]

## 2015-04-18 LAB — BASIC METABOLIC PANEL
Anion gap: 7 (ref 5–15)
BUN: 6 mg/dL (ref 6–20)
CHLORIDE: 109 mmol/L (ref 101–111)
CO2: 24 mmol/L (ref 22–32)
CREATININE: 0.79 mg/dL (ref 0.44–1.00)
Calcium: 8 mg/dL — ABNORMAL LOW (ref 8.9–10.3)
GFR calc non Af Amer: >60 mL/min (ref >60)
Glucose, Bld: 95 mg/dL (ref 65–99)
POTASSIUM: 3.2 mmol/L — AB (ref 3.5–5.1)
Sodium: 140 mmol/L (ref 135–145)

## 2015-04-18 LAB — CBC
HEMATOCRIT: 26 % — AB (ref 36.0–46.0)
HEMOGLOBIN: 7.7 g/dL — AB (ref 12.0–15.0)
MCH: 22.6 pg — ABNORMAL LOW (ref 26.0–34.0)
MCHC: 29.6 g/dL — AB (ref 30.0–36.0)
MCV: 76.2 fL — AB (ref 78.0–100.0)
Platelets: 229 10*3/uL (ref 150–400)
RBC: 3.41 MIL/uL — ABNORMAL LOW (ref 3.87–5.11)
RDW: 17.7 % — AB (ref 11.5–15.5)
WBC: 4.1 10*3/uL (ref 4.0–10.5)

## 2015-04-18 LAB — VANCOMYCIN, TROUGH: Vancomycin Tr: 14 ug/mL (ref 10.0–20.0)

## 2015-04-18 LAB — HEPARIN LEVEL (UNFRACTIONATED)
HEPARIN UNFRACTIONATED: 0.48 [IU]/mL (ref 0.30–0.70)
Heparin Unfractionated: 0.3 IU/mL (ref 0.30–0.70)

## 2015-04-18 LAB — PROTIME-INR
INR: 1.59 — AB (ref 0.00–1.49)
PROTHROMBIN TIME: 19 s — AB (ref 11.6–15.2)

## 2015-04-18 MED ORDER — POTASSIUM CHLORIDE CRYS ER 20 MEQ PO TBCR
40.0000 meq | EXTENDED_RELEASE_TABLET | Freq: Once | ORAL | Status: AC
  Administered 2015-04-18: 40 meq via ORAL
  Filled 2015-04-18: qty 2

## 2015-04-18 MED ORDER — VANCOMYCIN HCL IN DEXTROSE 1-5 GM/200ML-% IV SOLN
1000.0000 mg | Freq: Three times a day (TID) | INTRAVENOUS | Status: DC
  Administered 2015-04-18 – 2015-04-19 (×3): 1000 mg via INTRAVENOUS
  Filled 2015-04-18 (×4): qty 200

## 2015-04-18 MED ORDER — WARFARIN SODIUM 5 MG PO TABS
5.0000 mg | ORAL_TABLET | Freq: Once | ORAL | Status: AC
Start: 2015-04-18 — End: 2015-04-18
  Administered 2015-04-18: 5 mg via ORAL
  Filled 2015-04-18: qty 1

## 2015-04-18 NOTE — Progress Notes (Signed)
Patient Demographics  Melanie Crane, is a 39 y.o. female, DOB - 05/04/1976, [id number]  Admit date - 04/10/2015   Admitting Physician Alberteen Samhristopher P Danford, MD  Outpatient Primary MD for the patient is No PCP Per Patient  LOS - 8   Chief Complaint  Patient presents with  . Chest Pain  . Shortness of Breath       Admission HPI/Brief narrative: 39 year old female with history of IV drug abuse, with diagnosis of tricuspid valve endocarditis and septic joint at [facility] in July 2015, culture growing group B strep, treated with ORITAVANCIN, [place], had tricuspid valve surgery October of last year, records showing she was admitted in April of this year secondary to Pseudomonas endocarditis, treated with cefepime and tobramycin, transitioned to oral levofloxacin on discharge, hospital course was complicated by right upper arm DVT secondary to PICC line, presents 10/29 with complaints of fever, dyspnea and chest pain, workup significant for PE(septic versus bland) and septic pulmonary emboli with cavitating lung lesion, 2-D echo done 10/30 showing mobile vegetation on new  tricuspid valve, patient seen by CT surgery, recommended no surgery at this point. TEE demonstrated vegetation on TV. Will need 6 weeks of IV antibiotics.   Subjective:   Melanie Crane is afebrile; still with pleuritic CP (even better) and with good O2 sat on RA. No acute distress. Patient is hemodynamically stable. PICC line in place since 11/04 and on IV antibiotics as recommended by ID.  Assessment & Plan   Principal Problem:   Acute septic pulmonary embolism without acute cor pulmonale (HCC) Active Problems:   IV drug abuse   Anemia   Thrombocytopenia, unspecified (HCC)   Cigarette smoker   Hepatitis C   Septic embolism (HCC)   Embolism, pulmonary with infarction (HCC)   Abscess of right lung with pneumonia (HCC)    Sepsis (HCC)   Prosthetic valve endocarditis (HCC)  Pulmonary embolus with pulmonary parenchymal infarction - CTA chest with evidence of Acute left-sided pulmonary embolism with pulmonary parenchymal infarction. Septic vs bland emboli considered. - Pulmonary consult appreciated, they do recommend to continue with anticoagulation, especially they are not able to rule out PE, especially the PE is large enough to fill LLL pulmonary artery and segmental branches which is too large to be just from vegetation. -bilateral lower extremity venous Doppler Negative for DVT. -will continue IV antibiotics and anticoagulation; will initiate coumadin today  Septic emboli with cavitating lung lesion /endocarditis - CTA chest with evidence of multiple pulmonary nodules and masslike lesion, this is most likely related to septic emboli and lung abscess. - History of endocarditis GBS on  July 2015, and Pseudomonas on April 2016. - ID consult appreciated, continue IV vancomycin and Zosyn (initially on Rocephin which was transitioned back to Zosyn giving her history of Pseudomonas bacteremia ).  -Blood cultures no growth to date, will follow -PICC in place - 2-D echo nad TEE with mobile vegetation on tricuspid valve(patient status post tricuspid replacement), CT surgery consulted, patient not a candidate for redo surgery at this moment. Continue IV abx's  Anemia - Most likely anemia of chronic disease, hemoglobin trending down but this is most likely dilutional as she is on IV fluids, no signs of overt bleed.. -will monitor trend   IV  drug abuse -cessation counseling provided -HIV nonreactive -will benefit of inpatient treatment (if in agreement)  Hepatitis C -Workup as per ID -Will follow rec's  Unclear history of lupus anticoagulant -continue Follow up with Hematology As an Outpatient  Code Status: Full  Family Communication: None at bedside  Disposition Plan: will need prolong IV antibiotics for 6  weeks at discharge; needs SNF for it. Antibiotics until 05/22/15   Procedures  TEE: normal LV function; moderate RAE; s/p TVR with tricuspid stenosis (mean gradient 12 mmHg); mild TR; vegetation on prosthetic TV; no vegetations on aortic, mitral or pulmonic valves.   Consults   ID PCCM CT surgery Cardiology  Medications  Scheduled Meds: . piperacillin-tazobactam (ZOSYN)  IV  3.375 g Intravenous 3 times per day  . sodium chloride  3 mL Intravenous Q12H  . vancomycin  1,000 mg Intravenous Q8H  . warfarin  5 mg Oral ONCE-1800  . Warfarin - Pharmacist Dosing Inpatient   Does not apply q1800   Continuous Infusions: . sodium chloride 75 mL/hr at 04/18/15 0616  . heparin 3,000 Units/hr (04/18/15 1328)   PRN Meds:.HYDROmorphone (DILAUDID) injection, oxyCODONE, sodium chloride  DVT Prophylaxis   Heparin GTT  Lab Results  Component Value Date   PLT 229 04/18/2015    Antibiotics  Anti-infectives    Start     Dose/Rate Route Frequency Ordered Stop   04/18/15 1800  vancomycin (VANCOCIN) IVPB 1000 mg/200 mL premix     1,000 mg 200 mL/hr over 60 Minutes Intravenous Every 8 hours 04/18/15 1129     04/16/15 1800  vancomycin (VANCOCIN) IVPB 750 mg/150 ml premix  Status:  Discontinued     750 mg 150 mL/hr over 60 Minutes Intravenous Every 8 hours 04/16/15 0844 04/18/15 1129   04/13/15 1400  piperacillin-tazobactam (ZOSYN) IVPB 3.375 g     3.375 g 12.5 mL/hr over 240 Minutes Intravenous 3 times per day 04/13/15 1144     04/13/15 0200  vancomycin (VANCOCIN) 1,250 mg in sodium chloride 0.9 % 250 mL IVPB  Status:  Discontinued     1,250 mg 166.7 mL/hr over 90 Minutes Intravenous Every 8 hours 04/12/15 2131 04/16/15 0844   04/11/15 1030  cefTRIAXone (ROCEPHIN) 2 g in dextrose 5 % 50 mL IVPB  Status:  Discontinued     2 g 100 mL/hr over 30 Minutes Intravenous Every 24 hours 04/11/15 1023 04/13/15 1112   04/11/15 0400  piperacillin-tazobactam (ZOSYN) IVPB 3.375 g  Status:  Discontinued      3.375 g 12.5 mL/hr over 240 Minutes Intravenous Every 8 hours 04/10/15 1925 04/11/15 1023   04/11/15 0400  vancomycin (VANCOCIN) IVPB 750 mg/150 ml premix  Status:  Discontinued     750 mg 150 mL/hr over 60 Minutes Intravenous Every 8 hours 04/10/15 1925 04/12/15 2131   04/10/15 1930  vancomycin (VANCOCIN) 1,500 mg in sodium chloride 0.9 % 500 mL IVPB     1,500 mg 250 mL/hr over 120 Minutes Intravenous  Once 04/10/15 1925 04/10/15 2235   04/10/15 1915  piperacillin-tazobactam (ZOSYN) IVPB 3.375 g     3.375 g 100 mL/hr over 30 Minutes Intravenous  Once 04/10/15 1910 04/10/15 2107   04/10/15 1915  vancomycin (VANCOCIN) IVPB 1000 mg/200 mL premix  Status:  Discontinued     1,000 mg 200 mL/hr over 60 Minutes Intravenous  Once 04/10/15 1910 04/10/15 1925      Objective:   Filed Vitals:   04/17/15 1452 04/17/15 2132 04/18/15 0509 04/18/15 1319  BP: 124/95 130/88  112/85 121/94  Pulse: 81 88 76 88  Temp: 98.3 F (36.8 C) 99.4 F (37.4 C) 98.2 F (36.8 C) 98.5 F (36.9 C)  TempSrc: Oral Oral Oral Oral  Resp: 17 19 18 17   Height:      Weight:      SpO2: 95% 94% 93% 96%    Wt Readings from Last 3 Encounters:  04/14/15 73.029 kg (161 lb)  12/28/13 70.2 kg (154 lb 12.2 oz)     Intake/Output Summary (Last 24 hours) at 04/18/15 1433 Last data filed at 04/18/15 1350  Gross per 24 hour  Intake 2964.25 ml  Output   2500 ml  Net 464.25 ml     Physical Exam Awake Alert, Oriented X 3, in no major distress. Still with pleuritic CP (worse with movements and deep breath; but reports current analgesic regimen helping controlling pain). No fever and with good O2 sat on RA Supple Neck, No JVD, no thyromegaly Symmetrical Chest wall movement, fair air movement, no wheezing No Gallops, no Rubs or gallops; positive Murmur, S1 and S2 +ve B.Sounds, Abd Soft, No tenderness, No organomegaly appriciated, No rebound - guarding or rigidity. No Cyanosis, Clubbing or edema, No new Rash or bruise      Data Review   Micro Results Recent Results (from the past 240 hour(s))  Blood Culture (routine x 2)     Status: None   Collection Time: 04/10/15  7:30 PM  Result Value Ref Range Status   Specimen Description BLOOD RIGHT ARM  Final   Special Requests BOTTLES DRAWN AEROBIC AND ANAEROBIC 5CC  Final   Culture NO GROWTH 5 DAYS  Final   Report Status 04/15/2015 FINAL  Final  Blood Culture (routine x 2)     Status: None   Collection Time: 04/10/15  7:45 PM  Result Value Ref Range Status   Specimen Description BLOOD LEFT ARM  Final   Special Requests BOTTLES DRAWN AEROBIC AND ANAEROBIC 5CC  Final   Culture NO GROWTH 5 DAYS  Final   Report Status 04/15/2015 FINAL  Final  Urine culture     Status: None   Collection Time: 04/10/15  7:50 PM  Result Value Ref Range Status   Specimen Description URINE, RANDOM  Final   Special Requests NONE  Final   Culture NO GROWTH 1 DAY  Final   Report Status 04/12/2015 FINAL  Final  Culture, blood (routine x 2)     Status: None   Collection Time: 04/11/15  1:35 AM  Result Value Ref Range Status   Specimen Description BLOOD ARM LEFT  Final   Special Requests BOTTLES DRAWN AEROBIC ONLY 1CC  Final   Culture NO GROWTH 5 DAYS  Final   Report Status 04/16/2015 FINAL  Final  Culture, blood (routine x 2)     Status: None   Collection Time: 04/11/15  1:42 AM  Result Value Ref Range Status   Specimen Description BLOOD HAND LEFT  Final   Special Requests BOTTLES DRAWN AEROBIC ONLY 1CC  Final   Culture NO GROWTH 5 DAYS  Final   Report Status 04/16/2015 FINAL  Final    Radiology Reports Dg Chest 2 View  04/10/2015  CLINICAL DATA:  Pain upper right breast towards medial border. 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 Chest pain and shortness breath for 2 days. EXAM: CHEST  2 VIEW COMPARISON:  Chest x-ray dated 12/28/2013. FINDINGS: Heart size is upper normal, not significantly changed compared to the previous exam. Overall cardiomediastinal silhouette  is stable  in size and configuration. Ill-defined opacities are seen within the right mid lung region and at the left lung base. A focal rounded nodular density overlying a mid thoracic vertebral body on the lateral view may be related to the right mid lung opacity. No pleural effusions seen. No pneumothorax. No acute osseous abnormality. IMPRESSION: 1. Ill-defined opacity within the right mid lung region with an additional smaller ill-defined opacity at the left lung base. This could represent asymmetric edema or pneumonia. 2. Focal nodular density overlying a mid thoracic vertebral body on the lateral view, measuring 1.3 cm greatest dimension, which may be related to the right mid lung opacity. A neoplastic process cannot be excluded. Recommend chest CT for further characterization. Electronically Signed   By: Bary Richard M.D.   On: 04/10/2015 18:34   Ct Angio Chest Pe W/cm &/or Wo Cm  04/10/2015  CLINICAL DATA:  Right-sided chest pain for 2 days, shortness of breath, cough, chest pain, IV drug abuse, acute bacterial endocarditis EXAM: CT ANGIOGRAPHY CHEST WITH CONTRAST TECHNIQUE: Multidetector CT imaging of the chest was performed using the standard protocol during bolus administration of intravenous contrast. Multiplanar CT image reconstructions and MIPs were obtained to evaluate the vascular anatomy. CONTRAST:  80mL OMNIPAQUE IOHEXOL 350 MG/ML SOLN COMPARISON:  04/10/2015 chest radiograph FINDINGS: Mediastinum/Nodes: There is filling defect in the left lower lobe pulmonary artery at the hilum extending into numerous branches. Branches supplying the lateral left lower lobe appear totally occluded as this vessel extends into the area of consolidation in the anterior lateral left lower lobe described above. Left upper lobe pulmonary arteries are clear. Right pulmonary arteries are clear. There are numerous mediastinal lymph nodes. Sub carinal adenopathy measures 12 mm and right hilar adenopathy measures 12 mm.  Lungs/Pleura: Posteriorly in the right lower lobe superior segment, there is a 5.2. x 3.8 x 4.5 cm well-defined rounded pleural-based abnormality that consist of consolidation mixed with areas of air attenuation. The walls of this lesion are mildly irregular and there is mild surrounding inflammatory change. There are multiple irregular right upper lobe pulmonary nodules, seen on series 5, image number 47, 48, and 59. The largest of these is seen on image 59 and measures 10 mm. There is consolidation in the anterior inferior medial right middle lobe which appears most consistent with atelectasis. In the left upper lobe there is an irregular 7 mm pulmonary nodule on image number 63. There is a 6 mm nodule in the left upper lobe on image number 45. There is consolidation in the anterior lateral aspect of the left lower lobe at the level of the diaphragm. There is a small right pleural effusion. Upper abdomen: The spleen is only partially imaged but appears possibly enlarged. The liver may also be enlarged. Musculoskeletal: No acute or focally suspicious findings. Review of the MIP images confirms the above findings. IMPRESSION: 1. Acute left-sided pulmonary embolism with pulmonary parenchymal infarction. Septic vs bland emboli considered. 2. Large complex masslike structure right lower lobe. Malignancy is not excluded. However, cavitating pneumonia or pulmonary abscess considered more likely, possibly from septic embolic seeding vs aspiration. 3. Multiple bilateral pulmonary nodules. Differential diagnostic possibilities include metastatic disease or septic emboli. 4. Several enlarged mediastinal lymph nodes could be reactive. Metastatic disease not excluded. Critical Value/emergent results were called by telephone at the time of interpretation on 04/10/2015 at 9:17 pm to Dr. Patria Mane , who verbally acknowledged these results. Electronically Signed   By: Edgar Frisk.D.  On: 04/10/2015 21:18   CBC  Recent  Labs Lab 04/14/15 0426 04/15/15 0243 04/16/15 0256 04/17/15 0425 04/18/15 0550  WBC 6.5 6.1 5.5 5.1 4.1  HGB 8.3* 8.0* 7.8* 7.5* 7.7*  HCT 27.1* 26.6* 25.8* 25.5* 26.0*  PLT 213 213 212 236 229  MCV 74.9* 75.6* 76.6* 76.8* 76.2*  MCH 22.9* 22.7* 23.1* 22.6* 22.6*  MCHC 30.6 30.1 30.2 29.4* 29.6*  RDW 17.9* 17.7* 17.6* 17.7* 17.7*    Chemistries   Recent Labs Lab 04/12/15 0140 04/13/15 0130 04/16/15 0256 04/18/15 0550  NA 137 137 139 140  K 3.7 3.7 3.3* 3.2*  CL 107 108 107 109  CO2 21* 19* 23 24  GLUCOSE 114* 108* 92 95  BUN CREATININE 0.71 0.67 0.73 0.79  CALCIUM 8.1* 8.1* 8.0* 8.0*   Coagulation profile  Recent Labs Lab 04/15/15 1540 04/16/15 0256 04/17/15 0425 04/18/15 0550  INR 1.31 1.23 1.34 1.59*    ----------------------------------------------------------------------------------------------------------------- Invalid input(s): POCBNP    Time Spent in minutes   30 minutes   Vassie Loll M.D on 04/18/2015 at 2:33 PM  Between 7am to 7pm - Pager - [phone]  After 7pm go to [email or web address] - password [facility]  [facility]   Office  [phone]

## 2015-04-18 NOTE — Progress Notes (Signed)
Pharmacy Antibiotic Time-Out Note  Chyrl CivatteJoann Verdie Shireheresa Strebel is a 39 y.o. year-old female admitted on 04/10/2015.  The patient is currently on vancomycin for sepsis/endocarditis.  Assessment/Plan: Day #9 of vanc and day #6 of Zosyn for sepsis/endocarditis. Known IVDA and prosthetic tricuspid valve vegetation. ID request restart Zosyn for pseudomonal coverage (hx of this in past in [place] ID following. No surgery planned at this time due to likelihood of reinfection. Plan for IV abx through December 10th. Afebrile, WBC wnl. SCr stable, CrCl ~2690ml/min. UOP good at 0.759ml/kg/hr.  Increase vancomycin to 1000mg  IV Q8 Continue Zosyn 3.375 gm IV q8h (4 hour infusion) Monitor clinical picture, renal function, VT at Css F/U C&S, abx deescalation / LOT   Recent Labs Lab 04/14/15 0426 04/15/15 0243 04/16/15 0256 04/17/15 0425 04/18/15 0550  WBC 6.5 6.1 5.5 5.1 4.1     Recent Labs Lab 04/12/15 0140 04/13/15 0130 04/16/15 0256 04/18/15 0550  CREATININE 0.71 0.67 0.73 0.79   Estimated Creatinine Clearance: 92.4 mL/min (by C-G formula based on Cr of 0.79). Tmax/24h 98.6  Antimicrobial allergies: n/a  Antimicrobials this admission: Vanc 10/29 >> Zosyn 10/29 >> 10/30; resume 11/1 >> CTX 10/30 >> 11/1  Levels/dose changes this admission: 10/31 VT = 8 (On 750 mg Q8H - dose incr'd) 11/4 VT = 24 (On 1250 mg Q8H - Next dose already given, will adjust for next dose) 11/6 VT = 14 (Drawn appropriately, will shoot for higher end of 15-920mcg/mL goal - dose incr'd)  Microbiology Results: 10/29 Blood x 2 - No growth Final 10/29 Urine: negative 10/30 Blood x 2 - No growth Final  Thank you for allowing pharmacy to be a part of this patient's care.  Enzo BiNathan Maleiya Pergola, PharmD Clinical Pharmacist Pager [phone] 04/18/2015 11:26 AM

## 2015-04-18 NOTE — Progress Notes (Addendum)
ANTICOAGULATION CONSULT NOTE   Pharmacy Consult for heparin Indication: pulmonary embolus   No Known Allergies Patient Measurements: Height: 5\' 4"  (162.6 cm) Weight: 161 lb (73.029 kg) IBW/kg (Calculated) : 54.7 Heparin Dosing Weight: 70kg Vital Signs: Temp: 98.2 F (36.8 C) (11/06 0509) Temp Source: Oral (11/06 0509) BP: 112/85 mmHg (11/06 0509) Pulse Rate: 76 (11/06 0509) Labs:  Recent Labs  04/16/15 0256  04/17/15 0425 04/17/15 1030 04/18/15 0550  HGB 7.8*  --  7.5*  --  7.7*  HCT 25.8*  --  25.5*  --  26.0*  PLT 212  --  236  --  229  LABPROT 15.6*  --  16.7*  --  19.0*  INR 1.23  --  1.34  --  1.59*  HEPARINUNFRC 0.23*  < > 0.34 0.47 0.30  CREATININE 0.73  --   --   --  0.79  < > = values in this interval not displayed.  Estimated Creatinine Clearance: 92.4 mL/min (by C-G formula based on Cr of 0.79).  Assessment: 39 yo F admitted 04/10/2015 with history of R sided bacterial endocarditis/tricuspid vegetations, continued IVDU (injects Opana), and anemia, who presented to the ED with complaints of gradual onset chest pain and shortness of breath 2 days. CTA showed possible left sided PE or septic emboli.   HL 0.30 (therapeutic, but low end of range), Hgb 7.7, Plt wnl. No s/sx of bleeding noted. Coumadin started 11/3. Current INR is sub therapeutic at 1.59.  Goal of Therapy:  Heparin level 0.3-0.7 units/ml Monitor platelets by anticoagulation protocol: Yes    Plan:  Increase heparin gtt to 3000 units/hr (*High rate ~42 units/kg/hr) Give coumadin 5mg  PO x 1 tonight Monitor daily INR, HL, CBC, s/s of bleed  Casilda Carlsaylor Stone, PharmD. Clinical Pharmacist Resident Pager: [phone] 04/18/2015 7:15 AM   ADDENDUM:  HL came back therapeutic.   Plan: Continue heparin gtt at 3,000 units/hr (High rate ~42 units/kg/hr) Give coumadin 5mg  PO x 1 tonight Monitor daily HL, INR, CBC, s/s of bleed

## 2015-04-19 DIAGNOSIS — A419 Sepsis, unspecified organism: Secondary | ICD-10-CM

## 2015-04-19 DIAGNOSIS — R079 Chest pain, unspecified: Secondary | ICD-10-CM | POA: Insufficient documentation

## 2015-04-19 DIAGNOSIS — D508 Other iron deficiency anemias: Secondary | ICD-10-CM

## 2015-04-19 LAB — CBC
HCT: 25.6 % — ABNORMAL LOW (ref 36.0–46.0)
HEMOGLOBIN: 7.6 g/dL — AB (ref 12.0–15.0)
MCH: 22.7 pg — ABNORMAL LOW (ref 26.0–34.0)
MCHC: 29.7 g/dL — ABNORMAL LOW (ref 30.0–36.0)
MCV: 76.4 fL — ABNORMAL LOW (ref 78.0–100.0)
Platelets: 234 10*3/uL (ref 150–400)
RBC: 3.35 MIL/uL — AB (ref 3.87–5.11)
RDW: 17.7 % — ABNORMAL HIGH (ref 11.5–15.5)
WBC: 3.7 10*3/uL — ABNORMAL LOW (ref 4.0–10.5)

## 2015-04-19 LAB — PROTIME-INR
INR: 1.96 — AB (ref 0.00–1.49)
PROTHROMBIN TIME: 22.3 s — AB (ref 11.6–15.2)

## 2015-04-19 LAB — HEPARIN LEVEL (UNFRACTIONATED): HEPARIN UNFRACTIONATED: 0.63 [IU]/mL (ref 0.30–0.70)

## 2015-04-19 MED ORDER — ENOXAPARIN SODIUM 80 MG/0.8ML ~~LOC~~ SOLN
70.0000 mg | Freq: Once | SUBCUTANEOUS | Status: AC
  Administered 2015-04-19: 70 mg via SUBCUTANEOUS
  Filled 2015-04-19: qty 0.8

## 2015-04-19 MED ORDER — ENOXAPARIN SODIUM 80 MG/0.8ML ~~LOC~~ SOLN: 70.0000 mg | Freq: Two times a day (BID) | SUBCUTANEOUS | Status: DC

## 2015-04-19 MED ORDER — PIPERACILLIN-TAZOBACTAM 3.375 G IVPB: 3.3750 g | Freq: Three times a day (TID) | INTRAVENOUS | Status: AC

## 2015-04-19 MED ORDER — WARFARIN SODIUM 7.5 MG PO TABS
7.5000 mg | ORAL_TABLET | Freq: Once | ORAL | Status: AC
  Administered 2015-04-19: 7.5 mg via ORAL
  Filled 2015-04-19: qty 1

## 2015-04-19 MED ORDER — OXYCODONE HCL 10 MG PO TABS: 10.0000 mg | ORAL_TABLET | ORAL | Status: DC | PRN

## 2015-04-19 MED ORDER — VANCOMYCIN HCL IN DEXTROSE 1-5 GM/200ML-% IV SOLN: 1000.0000 mg | Freq: Three times a day (TID) | INTRAVENOUS | Status: AC

## 2015-04-19 MED ORDER — WARFARIN SODIUM 5 MG PO TABS: 5.0000 mg | ORAL_TABLET | Freq: Once | ORAL | Status: DC

## 2015-04-19 MED ORDER — HEPARIN SOD (PORK) LOCK FLUSH 100 UNIT/ML IV SOLN
250.0000 [IU] | INTRAVENOUS | Status: AC | PRN
  Administered 2015-04-19: 250 [IU]

## 2015-04-19 NOTE — Discharge Summary (Signed)
Physician Discharge Summary  Melanie Crane ZOX:[id number] DOB: [name]  PCP: No PCP Per Patient  Admit date: 04/10/2015 Discharge date: 04/19/2015  Time spent: 40 minutes  Recommendations for Outpatient Follow-up:  1. CBC in 5 days to follow Hgb trend 2. Close follow up to INR and discontinue  lovenox after 48 hours of therapeutic state 3. Check BMET weekly to follow electrolytes and renal function  Discharge Diagnoses:  Principal Problem:   Acute septic pulmonary embolism without acute cor pulmonale (HCC) Active Problems:   IV drug abuse   Anemia   Thrombocytopenia, unspecified (HCC)   Cigarette smoker   Hepatitis C   Septic embolism (HCC)   Embolism, pulmonary with infarction (HCC)   Abscess of right lung with pneumonia (HCC)   Sepsis (HCC)   Prosthetic valve endocarditis (HCC)   Discharge Condition: stable and improved. No fever and with control CP on current PO analgesic therapy. Will be discharge to SNF for IV antibiotic therapy.  Diet recommendation: regular diet  Filed Weights   04/10/15 1918 04/11/15 0030 04/14/15 0924  Weight: 72.576 kg (160 lb) 73.074 kg (161 lb 1.6 oz) 73.029 kg (161 lb)    History of present illness:  39 year old female with history of IV drug abuse, with diagnosis of tricuspid valve endocarditis and septic joint at [facility] in July 2015, culture growing group B strep, treated with ORITAVANCIN, patient moved to [place], had tricuspid valve surgery October of last year, records showing she was admitted in April of this year secondary to Pseudomonas endocarditis, treated with cefepime and tobramycin, transitioned to oral levofloxacin on discharge, hospital course was complicated by right upper arm DVT secondary to PICC line, presents 10/29 with complaints of fever, dyspnea and chest pain, workup significant for PE(septic versus bland) and septic pulmonary emboli with cavitating lung lesion, 2-D echo done 10/30 showing mobile  vegetation on new tricuspid valve, patient seen by CT surgery, recommended no surgery at this point. TEE demonstrated vegetation on TV. Will need 6 weeks of IV antibiotics.  Hospital Course:  Pulmonary embolus with pulmonary parenchymal infarction - CTA chest with evidence of Acute left-sided pulmonary embolism with pulmonary parenchymal infarction. Septic vs bland emboli considered. - Pulmonary consult appreciated, they do recommend to continue with anticoagulation, especially they are not able to rule out PE, especially the PE is large enough to fill LLL pulmonary artery and segmental branches which is too large to be just from vegetation. -bilateral lower extremity venous Doppler Negative for DVT. -will continue IV antibiotics and anticoagulation; INR almost therapeutic. Plan is to bridge with lovenox until she have 48 hours with therapeutic INR.   Septic emboli with cavitating lung lesion /phrostetic valve endocarditis - CTA chest with evidence of multiple pulmonary nodules and masslike lesion, this is most likely related to septic emboli and lung abscess. - History of endocarditis GBS on July 2015, and Pseudomonas on April 2016. - ID consult appreciated, continue IV vancomycin and Zosyn (ID service will schedule outpatient follow up)  -Blood cultures no growth to date, will follow -PICC in place - 2-D echo nad TEE with mobile vegetation on tricuspid valve(patient status post tricuspid replacement), CT surgery consulted, patient not a candidate for redo surgery at this moment. Continue IV abx's until 05/22/15  Anemia -Most likely anemia of chronic disease, hemoglobin trending down but this is most likely dilutional as she is on IV fluids, no signs of overt bleed.. -will recommend close monitoring of Hgb trend   IV drug abuse -  cessation counseling provided -HIV nonreactive -will benefit of inpatient treatment (if in agreement down the road)  Hepatitis C -Workup as per ID -Will  follow rec's  Unclear history of lupus anticoagulant -continue Follow up with Hematology As an Outpatient (patient was already following with someone)  Procedures:  TEE: normal LV function; moderate RAE; s/p TVR with tricuspid stenosis (mean gradient 12 mmHg); mild TR; vegetation on prosthetic TV; no vegetations on aortic, mitral or pulmonic valves.  Consultations:  CVTS  Cardiology for TEE  ID  Discharge Exam: Filed Vitals:   04/19/15 0912  BP: 123/89  Pulse: 85  Temp:   Resp:    Awake Alert, Oriented X 3, in no major distress. Still with pleuritic CP (worse with movements and deep breath; but reports current analgesic regimen helping controlling pain). No fever and with good O2 sat on RA Supple Neck, No JVD, no thyromegaly Symmetrical Chest wall movement, fair air movement, no wheezing No Gallops, no Rubs or gallops; positive Murmur, S1 and S2 +ve B.Sounds, Abd Soft, No tenderness, No organomegaly appriciated, No rebound - guarding or rigidity. No Cyanosis, Clubbing or edema, No new Rash or bruise    Discharge Instructions   Discharge Instructions    Discharge instructions    Complete by:  As directed   Maintain adequate hydration BMET and every week to follow renal function and electrolytes CBC to be repeated in 5 days to follow Hgb trend Close follow up of INR and stop lovenox after therapeutic INR for 48 hours          Current Discharge Medication List    START taking these medications   Details  enoxaparin (LOVENOX) 80 MG/0.8ML injection Inject 0.7 mLs (70 mg total) into the skin every 12 (twelve) hours. To be used until INR therapeutic for > 48 hours    oxyCODONE 10 MG TABS Take 1 tablet (10 mg total) by mouth every 4 (four) hours as needed for moderate pain. Qty: 20 tablet, Refills: 0    piperacillin-tazobactam (ZOSYN) 3.375 GM/50ML IVPB Inject 50 mLs (3.375 g total) into the vein every 8 (eight) hours.    vancomycin (VANCOCIN) 1 GM/200ML SOLN Inject  200 mLs (1,000 mg total) into the vein every 8 (eight) hours.    warfarin (COUMADIN) 5 MG tablet Take 1 tablet (5 mg total) by mouth once.       No Known Allergies   The results of significant diagnostics from this hospitalization (including imaging, microbiology, ancillary and laboratory) are listed below for reference.    Significant Diagnostic Studies: Dg Chest 2 View  04/10/2015  CLINICAL DATA:  Pain upper right breast towards medial border. 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 Chest pain and shortness breath for 2 days. EXAM: CHEST  2 VIEW COMPARISON:  Chest x-ray dated 12/28/2013. FINDINGS: Heart size is upper normal, not significantly changed compared to the previous exam. Overall cardiomediastinal silhouette is stable in size and configuration. Ill-defined opacities are seen within the right mid lung region and at the left lung base. A focal rounded nodular density overlying a mid thoracic vertebral body on the lateral view may be related to the right mid lung opacity. No pleural effusions seen. No pneumothorax. No acute osseous abnormality. IMPRESSION: 1. Ill-defined opacity within the right mid lung region with an additional smaller ill-defined opacity at the left lung base. This could represent asymmetric edema or pneumonia. 2. Focal nodular density overlying a mid thoracic vertebral body on the lateral view, measuring 1.3 cm greatest dimension,  which may be related to the right mid lung opacity. A neoplastic process cannot be excluded. Recommend chest CT for further characterization. Electronically Signed   By: Bary RichardStan  Maynard M.D.   On: 04/10/2015 18:34   Ct Angio Chest Pe W/cm &/or Wo Cm  04/10/2015  CLINICAL DATA:  Right-sided chest pain for 2 days, shortness of breath, cough, chest pain, IV drug abuse, acute bacterial endocarditis EXAM: CT ANGIOGRAPHY CHEST WITH CONTRAST TECHNIQUE: Multidetector CT imaging of the chest was performed using the standard protocol during bolus  administration of intravenous contrast. Multiplanar CT image reconstructions and MIPs were obtained to evaluate the vascular anatomy. CONTRAST:  80mL OMNIPAQUE IOHEXOL 350 MG/ML SOLN COMPARISON:  04/10/2015 chest radiograph FINDINGS: Mediastinum/Nodes: There is filling defect in the left lower lobe pulmonary artery at the hilum extending into numerous branches. Branches supplying the lateral left lower lobe appear totally occluded as this vessel extends into the area of consolidation in the anterior lateral left lower lobe described above. Left upper lobe pulmonary arteries are clear. Right pulmonary arteries are clear. There are numerous mediastinal lymph nodes. Sub carinal adenopathy measures 12 mm and right hilar adenopathy measures 12 mm. Lungs/Pleura: Posteriorly in the right lower lobe superior segment, there is a 5.2. x 3.8 x 4.5 cm well-defined rounded pleural-based abnormality that consist of consolidation mixed with areas of air attenuation. The walls of this lesion are mildly irregular and there is mild surrounding inflammatory change. There are multiple irregular right upper lobe pulmonary nodules, seen on series 5, image number 47, 48, and 59. The largest of these is seen on image 59 and measures 10 mm. There is consolidation in the anterior inferior medial right middle lobe which appears most consistent with atelectasis. In the left upper lobe there is an irregular 7 mm pulmonary nodule on image number 63. There is a 6 mm nodule in the left upper lobe on image number 45. There is consolidation in the anterior lateral aspect of the left lower lobe at the level of the diaphragm. There is a small right pleural effusion. Upper abdomen: The spleen is only partially imaged but appears possibly enlarged. The liver may also be enlarged. Musculoskeletal: No acute or focally suspicious findings. Review of the MIP images confirms the above findings. IMPRESSION: 1. Acute left-sided pulmonary embolism with  pulmonary parenchymal infarction. Septic vs bland emboli considered. 2. Large complex masslike structure right lower lobe. Malignancy is not excluded. However, cavitating pneumonia or pulmonary abscess considered more likely, possibly from septic embolic seeding vs aspiration. 3. Multiple bilateral pulmonary nodules. Differential diagnostic possibilities include metastatic disease or septic emboli. 4. Several enlarged mediastinal lymph nodes could be reactive. Metastatic disease not excluded. Critical Value/emergent results were called by telephone at the time of interpretation on 04/10/2015 at 9:17 pm to Dr. Patria Maneampos , who verbally acknowledged these results. Electronically Signed   By: Esperanza Heiraymond  Rubner M.D.   On: 04/10/2015 21:18    Microbiology: Recent Results (from the past 240 hour(s))  Blood Culture (routine x 2)     Status: None   Collection Time: 04/10/15  7:30 PM  Result Value Ref Range Status   Specimen Description BLOOD RIGHT ARM  Final   Special Requests BOTTLES DRAWN AEROBIC AND ANAEROBIC 5CC  Final   Culture NO GROWTH 5 DAYS  Final   Report Status 04/15/2015 FINAL  Final  Blood Culture (routine x 2)     Status: None   Collection Time: 04/10/15  7:45 PM  Result Value Ref Range  Status   Specimen Description BLOOD LEFT ARM  Final   Special Requests BOTTLES DRAWN AEROBIC AND ANAEROBIC 5CC  Final   Culture NO GROWTH 5 DAYS  Final   Report Status 04/15/2015 FINAL  Final  Urine culture     Status: None   Collection Time: 04/10/15  7:50 PM  Result Value Ref Range Status   Specimen Description URINE, RANDOM  Final   Special Requests NONE  Final   Culture NO GROWTH 1 DAY  Final   Report Status 04/12/2015 FINAL  Final  Culture, blood (routine x 2)     Status: None   Collection Time: 04/11/15  1:35 AM  Result Value Ref Range Status   Specimen Description BLOOD ARM LEFT  Final   Special Requests BOTTLES DRAWN AEROBIC ONLY 1CC  Final   Culture NO GROWTH 5 DAYS  Final   Report Status  04/16/2015 FINAL  Final  Culture, blood (routine x 2)     Status: None   Collection Time: 04/11/15  1:42 AM  Result Value Ref Range Status   Specimen Description BLOOD HAND LEFT  Final   Special Requests BOTTLES DRAWN AEROBIC ONLY 1CC  Final   Culture NO GROWTH 5 DAYS  Final   Report Status 04/16/2015 FINAL  Final     Labs: Basic Metabolic Panel:  Recent Labs Lab 04/13/15 0130 04/16/15 0256 04/18/15 0550  NA 137 139 140  K 3.7 3.3* 3.2*  CL 108 107 109  CO2 19* 23 24  GLUCOSE 108* 92 95  BUN CREATININE 0.67 0.73 0.79  CALCIUM 8.1* 8.0* 8.0*   CBC:  Recent Labs Lab 04/15/15 0243 04/16/15 0256 04/17/15 0425 04/18/15 0550 04/19/15 0525  WBC 6.1 5.5 5.1 4.1 3.7*  HGB 8.0* 7.8* 7.5* 7.7* 7.6*  HCT 26.6* 25.8* 25.5* 26.0* 25.6*  MCV 75.6* 76.6* 76.8* 76.2* 76.4*  PLT 213 212 236 229 234   Signed:  Vassie Loll  [facility] 04/19/2015, 10:46 AM

## 2015-04-19 NOTE — Clinical Social Work Placement (Signed)
   CLINICAL SOCIAL WORK PLACEMENT  NOTE  Date:  04/19/2015  Patient Details  Name: Melanie Crane MRN: [id number] Date of Birth: 10/03/1975  Clinical Social Work is seeking post-discharge placement for this patient at the Skilled  Nursing Facility level of care (*CSW will initial, date and re-position this form in  chart as items are completed):  Yes   Patient/family provided with [place] Clinical Social Work Department's list of facilities offering this level of care within the geographic area requested by the patient (or if unable, by the patient's family).  Yes   Patient/family informed of their freedom to choose among providers that offer the needed level of care, that participate in Medicare, Medicaid or managed care program needed by the patient, have an available bed and are willing to accept the patient.  Yes   Patient/family informed of [facility]'s ownership interest in [facility] and [facility], as well as of the fact that they are under no obligation to receive care at these facilities.  PASRR submitted to EDS on 04/15/15     PASRR number received on 04/15/15     Existing PASRR number confirmed on       FL2 transmitted to all facilities in geographic area requested by pt/family on       FL2 transmitted to all facilities within larger geographic area on       Patient informed that his/her managed care company has contracts with or will negotiate with certain facilities, including the following:         YES - Patient/family informed of bed offers received.  Patient/(Facility selected) chooses bed at  [facility]  Physician recommends and patient chooses bed at      Patient to be [facility] to  [facility] on  04/19/15.  [facility]     Patient family notified on   of transfer.  Name of family member notified:        PHYSICIAN Please sign FL2     Additional Comment:     _______________________________________________ Cristobal Goldmannrawford, Charlesetta Milliron Bradley, LCSW 04/19/2015, 2:05 PM

## 2015-04-19 NOTE — Progress Notes (Signed)
04/19/2015 1300 Pt discharged [facility] via transportation services. Melanie Crane, Melanie Crane

## 2015-04-19 NOTE — Progress Notes (Signed)
ANTICOAGULATION CONSULT NOTE   Pharmacy Consult for heparin / warfarin  Indication: pulmonary embolus   No Known Allergies Patient Measurements: Height: 5\' 4"  (162.6 cm) Weight: 161 lb (73.029 kg) IBW/kg (Calculated) : 54.7 Heparin Dosing Weight: 70kg Vital Signs: Temp: 98.8 F (37.1 C) (11/07 0731) Temp Source: Oral (11/07 0731) BP: 123/89 mmHg (11/07 0912) Pulse Rate: 85 (11/07 0912) Labs:  Recent Labs  04/17/15 0425  04/18/15 0550 04/18/15 1352 04/19/15 0525  HGB 7.5*  --  7.7*  --  7.6*  HCT 25.5*  --  26.0*  --  25.6*  PLT 236  --  229  --  234  LABPROT 16.7*  --  19.0*  --  22.3*  INR 1.34  --  1.59*  --  1.96*  HEPARINUNFRC 0.34  < > 0.30 0.48 0.63  CREATININE  --   --  0.79  --   --   < > = values in this interval not displayed.  Estimated Creatinine Clearance: 92.4 mL/min (by C-G formula based on Cr of 0.79).  Assessment: 39 yo F admitted 04/10/2015 with history of R sided bacterial endocarditis/tricuspid vegetations, continued IVDU (injects Opana), and anemia, who presented to the ED with complaints of gradual onset chest pain and shortness of breath 2 days. CTA showed possible left sided PE or septic emboli.   HL 0.63 therapeutic on heparin drip 3000 uts/hr, CBC stable, no bleeding noted. Coumadin started 11/3. Current INR is sub therapeutic at 1.96 but increasing. Plan to send her to SNF today and bridge with enoxaparin  Goal of Therapy:  Heparin level 0.3-0.7 units/ml Monitor platelets by anticoagulation protocol: Yes    Plan:  Stop heaprin drip Enoxaparin 70mg  x1 now prior to d/c Then 70mg  q12 until INR > 2 Give coumadin 7.5mg  PO x 1 now prior to d/c  Then coumadin 5mg  daily    Leota SauersLisa Matin Mattioli Pharm.D. CPP, BCPS Clinical Pharmacist [phone] 04/19/2015 10:40 AM

## 2015-04-19 NOTE — Care Management Note (Signed)
Case Management Note  Patient Details  Name: Melanie RavelingJoann Theresa Letterman MRN: [id number] Date of Birth: 01/13/1976  Subjective/Objective:   39 y.o. F admitted 04/10/2015 with Pulmonary Embolus (septic vs [name]). Hx Tricuspid Valve Endocarditis and Septic Joint 2015. Now with Cavitating lung lesion and vegetation on new valve. Will need 6 weeks IV abx.                  Action/Plan:Discharge to SNF. No further CM needs at present.    Expected Discharge Date:                  Expected Discharge Plan:  Skilled Nursing Facility  In-House Referral:  Clinical Social Work  Discharge planning Services  CM Consult  Post Acute Care Choice:    Choice offered to:     DME Arranged:    DME Agency:     HH Arranged:    HH Agency:     Status of Service:  Completed, signed off  Medicare Important Message Given:    Date Medicare IM Given:    Medicare IM give by:    Date Additional Medicare IM Given:    Additional Medicare Important Message give by:     If discussed at Long Length of Stay Meetings, dates discussed:    Additional Comments: pt is scheduled to be [facility] [place] today at [facility] by [facility]. (per CSW note).   CrutchfieldDerrill Memo, Shirline Kendle M, RN 04/19/2015, 9:51 AM

## 2015-09-10 ENCOUNTER — Encounter (HOSPITAL_COMMUNITY): Payer: Self-pay | Admitting: *Deleted

## 2015-09-10 ENCOUNTER — Emergency Department (HOSPITAL_COMMUNITY): Payer: Self-pay

## 2015-09-10 ENCOUNTER — Inpatient Hospital Stay (HOSPITAL_COMMUNITY)
Admission: EM | Admit: 2015-09-10 | Discharge: 2015-09-22 | DRG: 871 | Disposition: A | Discharge status: Skilled Nursing Facility | Payer: Self-pay | Attending: Family Medicine | Admitting: Family Medicine

## 2015-09-10 DIAGNOSIS — R7881 Bacteremia: Secondary | ICD-10-CM | POA: Diagnosis present

## 2015-09-10 DIAGNOSIS — G894 Chronic pain syndrome: Secondary | ICD-10-CM | POA: Diagnosis present

## 2015-09-10 DIAGNOSIS — E876 Hypokalemia: Secondary | ICD-10-CM | POA: Diagnosis present

## 2015-09-10 DIAGNOSIS — J9690 Respiratory failure, unspecified, unspecified whether with hypoxia or hypercapnia: Secondary | ICD-10-CM

## 2015-09-10 DIAGNOSIS — E860 Dehydration: Secondary | ICD-10-CM | POA: Diagnosis present

## 2015-09-10 DIAGNOSIS — I330 Acute and subacute infective endocarditis: Secondary | ICD-10-CM | POA: Diagnosis present

## 2015-09-10 DIAGNOSIS — E872 Acidosis: Secondary | ICD-10-CM | POA: Diagnosis present

## 2015-09-10 DIAGNOSIS — F1910 Other psychoactive substance abuse, uncomplicated: Secondary | ICD-10-CM | POA: Diagnosis present

## 2015-09-10 DIAGNOSIS — N179 Acute kidney failure, unspecified: Secondary | ICD-10-CM | POA: Diagnosis present

## 2015-09-10 DIAGNOSIS — Y831 Surgical operation with implant of artificial internal device as the cause of abnormal reaction of the patient, or of later complication, without mention of misadventure at the time of the procedure: Secondary | ICD-10-CM | POA: Diagnosis present

## 2015-09-10 DIAGNOSIS — I2690 Septic pulmonary embolism without acute cor pulmonale: Secondary | ICD-10-CM | POA: Diagnosis present

## 2015-09-10 DIAGNOSIS — B9562 Methicillin resistant Staphylococcus aureus infection as the cause of diseases classified elsewhere: Secondary | ICD-10-CM | POA: Diagnosis present

## 2015-09-10 DIAGNOSIS — F17210 Nicotine dependence, cigarettes, uncomplicated: Secondary | ICD-10-CM | POA: Diagnosis present

## 2015-09-10 DIAGNOSIS — I76 Septic arterial embolism: Secondary | ICD-10-CM | POA: Diagnosis present

## 2015-09-10 DIAGNOSIS — J9601 Acute respiratory failure with hypoxia: Secondary | ICD-10-CM | POA: Diagnosis present

## 2015-09-10 DIAGNOSIS — D6862 Lupus anticoagulant syndrome: Secondary | ICD-10-CM | POA: Diagnosis present

## 2015-09-10 DIAGNOSIS — I38 Endocarditis, valve unspecified: Secondary | ICD-10-CM

## 2015-09-10 DIAGNOSIS — T826XXA Infection and inflammatory reaction due to cardiac valve prosthesis, initial encounter: Secondary | ICD-10-CM | POA: Diagnosis present

## 2015-09-10 DIAGNOSIS — R0602 Shortness of breath: Secondary | ICD-10-CM

## 2015-09-10 DIAGNOSIS — I4581 Long QT syndrome: Secondary | ICD-10-CM | POA: Diagnosis present

## 2015-09-10 DIAGNOSIS — E46 Unspecified protein-calorie malnutrition: Secondary | ICD-10-CM | POA: Diagnosis present

## 2015-09-10 DIAGNOSIS — Q211 Atrial septal defect: Secondary | ICD-10-CM

## 2015-09-10 DIAGNOSIS — Z9289 Personal history of other medical treatment: Secondary | ICD-10-CM

## 2015-09-10 DIAGNOSIS — B182 Chronic viral hepatitis C: Secondary | ICD-10-CM | POA: Diagnosis present

## 2015-09-10 DIAGNOSIS — Z86718 Personal history of other venous thrombosis and embolism: Secondary | ICD-10-CM

## 2015-09-10 DIAGNOSIS — F1120 Opioid dependence, uncomplicated: Secondary | ICD-10-CM

## 2015-09-10 DIAGNOSIS — B1920 Unspecified viral hepatitis C without hepatic coma: Secondary | ICD-10-CM | POA: Diagnosis present

## 2015-09-10 DIAGNOSIS — Z7901 Long term (current) use of anticoagulants: Secondary | ICD-10-CM

## 2015-09-10 DIAGNOSIS — R45851 Suicidal ideations: Secondary | ICD-10-CM | POA: Diagnosis present

## 2015-09-10 DIAGNOSIS — I959 Hypotension, unspecified: Secondary | ICD-10-CM | POA: Diagnosis not present

## 2015-09-10 DIAGNOSIS — A4101 Sepsis due to Methicillin susceptible Staphylococcus aureus: Secondary | ICD-10-CM | POA: Insufficient documentation

## 2015-09-10 DIAGNOSIS — R9431 Abnormal electrocardiogram [ECG] [EKG]: Secondary | ICD-10-CM | POA: Diagnosis present

## 2015-09-10 DIAGNOSIS — F1110 Opioid abuse, uncomplicated: Secondary | ICD-10-CM | POA: Insufficient documentation

## 2015-09-10 DIAGNOSIS — Z86711 Personal history of pulmonary embolism: Secondary | ICD-10-CM

## 2015-09-10 DIAGNOSIS — A4102 Sepsis due to Methicillin resistant Staphylococcus aureus: Principal | ICD-10-CM | POA: Diagnosis present

## 2015-09-10 DIAGNOSIS — Z515 Encounter for palliative care: Secondary | ICD-10-CM | POA: Insufficient documentation

## 2015-09-10 DIAGNOSIS — R6520 Severe sepsis without septic shock: Secondary | ICD-10-CM | POA: Diagnosis present

## 2015-09-10 DIAGNOSIS — D649 Anemia, unspecified: Secondary | ICD-10-CM | POA: Diagnosis present

## 2015-09-10 DIAGNOSIS — D696 Thrombocytopenia, unspecified: Secondary | ICD-10-CM | POA: Diagnosis present

## 2015-09-10 DIAGNOSIS — Z6828 Body mass index (BMI) 28.0-28.9, adult: Secondary | ICD-10-CM

## 2015-09-10 DIAGNOSIS — F1123 Opioid dependence with withdrawal: Secondary | ICD-10-CM | POA: Diagnosis present

## 2015-09-10 HISTORY — DX: Acute kidney failure, unspecified: N17.9

## 2015-09-10 LAB — URINE MICROSCOPIC-ADD ON

## 2015-09-10 LAB — CBC
HCT: 34.6 % — ABNORMAL LOW (ref 36.0–46.0)
Hemoglobin: 10.8 g/dL — ABNORMAL LOW (ref 12.0–15.0)
MCH: 23.1 pg — ABNORMAL LOW (ref 26.0–34.0)
MCHC: 31.2 g/dL (ref 30.0–36.0)
MCV: 74.1 fL — ABNORMAL LOW (ref 78.0–100.0)
PLATELETS: 44 10*3/uL — AB (ref 150–400)
RBC: 4.67 MIL/uL (ref 3.87–5.11)
RDW: 17.7 % — AB (ref 11.5–15.5)
WBC: 6 10*3/uL (ref 4.0–10.5)

## 2015-09-10 LAB — URINALYSIS, ROUTINE W REFLEX MICROSCOPIC
Glucose, UA: NEGATIVE mg/dL
HGB URINE DIPSTICK: NEGATIVE
Ketones, ur: 15 mg/dL — AB
NITRITE: POSITIVE — AB
PH: 6.5 (ref 5.0–8.0)
Protein, ur: 100 mg/dL — AB
SPECIFIC GRAVITY, URINE: 1.028 (ref 1.005–1.030)

## 2015-09-10 LAB — BASIC METABOLIC PANEL
Anion gap: 10 (ref 5–15)
BUN: 27 mg/dL — AB (ref 6–20)
CO2: 16 mmol/L — ABNORMAL LOW (ref 22–32)
CREATININE: 0.79 mg/dL (ref 0.44–1.00)
Calcium: 8 mg/dL — ABNORMAL LOW (ref 8.9–10.3)
Chloride: 113 mmol/L — ABNORMAL HIGH (ref 101–111)
GFR calc Af Amer: >60 mL/min (ref >60)
Glucose, Bld: 128 mg/dL — ABNORMAL HIGH (ref 65–99)
Potassium: 3.2 mmol/L — ABNORMAL LOW (ref 3.5–5.1)
SODIUM: 139 mmol/L (ref 135–145)

## 2015-09-10 LAB — RAPID URINE DRUG SCREEN, HOSP PERFORMED
AMPHETAMINES: POSITIVE — AB
BENZODIAZEPINES: POSITIVE — AB
Barbiturates: NOT DETECTED
Cocaine: NOT DETECTED
OPIATES: POSITIVE — AB
TETRAHYDROCANNABINOL: NOT DETECTED

## 2015-09-10 LAB — I-STAT CG4 LACTIC ACID, ED: Lactic Acid, Venous: 2.46 mmol/L (ref 0.5–2.0)

## 2015-09-10 LAB — TROPONIN I: TROPONIN I: 0.19 ng/mL — AB (ref <0.031)

## 2015-09-10 LAB — PREGNANCY, URINE: Preg Test, Ur: NEGATIVE

## 2015-09-10 LAB — PROTIME-INR
INR: 1.26 (ref 0.00–1.49)
Prothrombin Time: 15.9 seconds — ABNORMAL HIGH (ref 11.6–15.2)

## 2015-09-10 NOTE — ED Notes (Signed)
The pt is here  With chest pain she just signed out ama from the hospital in [place] she reports that she does not like that hospital  Her diagnosis is endocarditis and she has a history of the same very drowsy she can barely keep her eyes open and her speech is slurred.  She cannot tell me what med she last had

## 2015-09-10 NOTE — ED Provider Notes (Signed)
CSN: [id number]     Arrival date & time 09/10/15  2144 History  By signing my name below, I, Evon Slack, attest that this documentation has been prepared under the direction and in the presence of Zadie Rhine, MD. Electronically Signed: Evon Slack, ED Scribe. 09/10/2015. 11:41 PM.     Chief Complaint  Patient presents with  . Endocarditis    The history is provided by the patient. No language interpreter was used.   HPI Comments: Melanie Crane is a 40 y.o. female who presents to the Emergency Department complaining of constant central non radiating CP onset several months ago. Reports fevers, SOB, cough and slight abdominal pain. She states that her pain is worse when breathing. Pt does report intermittent IV drug use. Pt reports that her last use was 2-3 days prior. Pt was recently admitted at [facility] yesterday and signed her self out after 1 nights stay due to not being happy with the care she was receiving. Denies vomiting or LOC. 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 She states that her pain feels like previous endocarditis. Denies Hx of CAD. Pt denies Hx of PE or DVT's. LMP 6-8 months prior.    Past Medical History  Diagnosis Date  . Anemia   . IVDU (intravenous drug user)   . 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 Hepatitis C   . Miscarriage     Five times   Past Surgical History  Procedure Laterality Date  . Bunionectomy    . Tonsillectomy    . 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 Tee without cardioversion N/A 04/14/2015    Procedure: TRANSESOPHAGEAL ECHOCARDIOGRAM (TEE);  Surgeon: Lewayne Bunting, MD;  Location: [facility] ENDOSCOPY;  Service: Cardiovascular;  Laterality: N/A;   Family History  Problem Relation Age of Onset  . Hypertension Father   . Kidney failure Father   . Hyperlipidemia Mother   . CAD Mother   . Deafness  Mother    Social History  Substance Use Topics  . Smoking status: Current Every Day Smoker    Types: Cigarettes  . Smokeless tobacco: Never Used     Comment: 1 1/2 ppd  . Alcohol Use: No   OB History    No data available     Review of Systems  Constitutional: Positive for fever.  Respiratory: Positive for cough and shortness of breath.   Cardiovascular: Positive for chest pain.  Gastrointestinal: Positive for abdominal pain. Negative for vomiting.  Neurological: Negative for syncope.  All other systems reviewed and are negative.     Allergies  Review of patient's allergies indicates no known allergies.  Home Medications   Prior to Admission medications   Medication Sig Start Date End Date Taking? Authorizing Provider  enoxaparin (LOVENOX) 80 MG/0.8ML injection Inject 0.7 mLs (70 mg total) into the skin every 12 (twelve) hours. To be used until INR therapeutic for > 48 hours 04/19/15   Vassie Loll, MD  oxyCODONE 10 MG TABS Take 1 tablet (10 mg total) by mouth every 4 (four) hours as needed for moderate pain. 04/19/15   Vassie Loll, MD  warfarin (COUMADIN) 5 MG tablet Take 1 tablet (5 mg total) by mouth once. 04/20/15   Vassie Loll, MD   BP 102/80 mmHg  Pulse 97  Temp(Src) 98.7 F (37.1 C) (Oral)  Resp 20  SpO2 95%  LMP 04/12/2015   Physical Exam   CONSTITUTIONAL: disheveled  HEAD: Normocephalic/atraumatic EYES: pupils pinpoint  ENMT: Mucous membranes moist NECK: supple no meningeal signs SPINE/BACK:entire spine nontender CV: S1/S2 noted, no loud murmurs noted LUNGS: Lungs are clear to auscultation bilaterally, no apparent distress ABDOMEN: soft, nontender, no rebound or guarding, bowel sounds noted throughout abdomen GU:no cva tenderness NEURO: Pt is somnolent but is arousalable and answers question appropriately  EXTREMITIES: pulses normal/equal, full ROM, multiple healing track marks to both arms and legs  SKIN: warm, color normal PSYCH: no abnormalities  of mood noted, alert and oriented to situation   ED Course  Procedures   CRITICAL CARE Performed by: Joya GaskinsWICKLINE,Consuelo Suthers W Total critical care time: 33 minutes Critical care time was exclusive of separately billable procedures and treating other patients. Critical care was necessary to treat or prevent imminent or life-threatening deterioration. Critical care was time spent personally by me on the following activities: development of treatment plan with patient and/or surrogate as well as nursing, discussions with consultants, evaluation of patient's response to treatment, examination of patient, obtaining history from patient or surrogate, ordering and performing treatments and interventions, ordering and review of laboratory studies, ordering and review of radiographic studies, pulse oximetry and re-evaluation of patient's condition. PATIENT WITH SEPTIC EMBOLI, PROBABLE ENDOCARDITIS, REQUIRED IV FLUIDS, IV ANTIBIOTICS AND FREQUENT EVALUATIONS.  I SPOKE TO HOSPITALIST AND REVIEWED OUTSIDE HOSPITAL RECORDS.  SHE WORSENED IN THE ER AND HAD WORSENING LACTATE/SEPSIS  Medications  sodium chloride 0.9 % bolus 1,000 mL (1,000 mLs Intravenous New Bag/Given 09/11/15 0208)    Followed by  sodium chloride 0.9 % bolus 500 mL (500 mLs Intravenous New Bag/Given 09/11/15 0212)  sodium chloride 0.9 % bolus 1,000 mL (0 mLs Intravenous Stopped 09/11/15 0207)  vancomycin (VANCOCIN) IVPB 1000 mg/200 mL premix (0 mg Intravenous Stopped 09/11/15 0207)  ceFEPIme (MAXIPIME) 2 g in dextrose 5 % 50 mL IVPB (2 g Intravenous New Bag/Given 09/11/15 0213)  iohexol (OMNIPAQUE) 350 MG/ML injection 80 mL (50 mLs Intravenous Contrast Given 09/11/15 0225)      EXTERNAL JUGULAR IV PLACEMENT - PROCEDURE NOTE - 0040 I PLACED A RIGHT SIDED EJ IV AREA WAS CLEANSED APPROPRIATELY 20G IV WAS PLACED IV FLUSHED WITHOUT DIFFICULTY PATIENT TOLERATED WELL  DIAGNOSTIC STUDIES: Oxygen Saturation is 95% on RA, adequate by my interpretation.     COORDINATION OF CARE: 11:33 PM-Discussed treatment plan with pt at bedside and pt agreed to plan.   1:52 AM Pt with previous h/o endocarditis H/o IVDA She has also had septic emboli previously She will need CT chest and then admit Broad spectrum ABX ordered Pt has had 3 blood cultures drawn Pt currently stable, however lactate is now more elevated, code sepsis called   2:58 AM Sepsis - Repeat Assessment  Performed at:    0300am  Vitals     Blood pressure 125/93, pulse 80, temperature 98.7 F (37.1 C), temperature source Oral, resp. rate 24, last menstrual period 04/12/2015, SpO2 100 %.  Heart:     Regular rate and rhythm  Lungs:    CTA  Capillary Refill:   <2 sec  Peripheral Pulse:   Radial pulse palpable  Skin:     Normal Color  3:19 AM D/W DR OPYD WILL ADMIT FOR SEPTIC EMBOLI AND PROBABLE ENDOCARDITIS DUE TO IVDA PT STABILIZED BUT WORSENED LACTATE/SEPSIS BP 112/76 mmHg  Pulse 99  Temp(Src) 98.7 F (37.1 C) (Oral)  Resp 33  SpO2 94%  LMP 04/12/2015 I REVIEWED OUTSIDE HOSPITAL RECORDS FROM  [facility]  Labs Review Labs Reviewed  BASIC METABOLIC PANEL - Abnormal; Notable for the following:    Potassium 3.2 (*)    Chloride 113 (*)    CO2 16 (*)    Glucose, Bld 128 (*)    BUN 27 (*)    Calcium 8.0 (*)    All other components within normal limits  CBC - Abnormal; Notable for the following:    Hemoglobin 10.8 (*)    HCT 34.6 (*)    MCV 74.1 (*)    MCH 23.1 (*)    RDW 17.7 (*)    Platelets 44 (*)    All other components within normal limits  PROTIME-INR - Abnormal; Notable for the following:    Prothrombin Time 15.9 (*)    All other components within normal limits  TROPONIN I - Abnormal; Notable for the following:    Troponin I 0.19 (*)    All other components within normal limits  URINALYSIS, ROUTINE W REFLEX MICROSCOPIC (NOT AT [facility]) - Abnormal; Notable for the following:    Color, Urine ORANGE (*)    Bilirubin Urine LARGE (*)    Ketones, ur 15 (*)     Protein, ur 100 (*)    Nitrite POSITIVE (*)    Leukocytes, UA MODERATE (*)    All other components within normal limits  URINE RAPID DRUG SCREEN, HOSP PERFORMED - Abnormal; Notable for the following:    Opiates POSITIVE (*)    Benzodiazepines POSITIVE (*)    Amphetamines POSITIVE (*)    All other components within normal limits  URINE MICROSCOPIC-ADD ON - Abnormal; Notable for the following:    Squamous Epithelial / LPF 0-5 (*)    Bacteria, UA RARE (*)    Casts HYALINE CASTS (*)    All other components within normal limits  HCG, QUANTITATIVE, PREGNANCY - Abnormal; Notable for the following:    hCG, Beta Chain, Quant, S 7 (*)    All other components within normal limits  I-STAT CG4 LACTIC ACID, ED - Abnormal; Notable for the following:    Lactic Acid, Venous 2.46 (*)    All other components within normal limits  I-STAT BETA HCG BLOOD, ED ([facility], [facility], [facility] ONLY) - Abnormal; Notable for the following:    I-stat hCG, quantitative 16.6 (*)    All other components within normal limits  I-STAT CG4 LACTIC ACID, ED - Abnormal; Notable for the following:    Lactic Acid, Venous 3.63 (*)    All other components within normal limits  CULTURE, BLOOD (ROUTINE X 2)  CULTURE, BLOOD (ROUTINE X 2)  CULTURE, BLOOD (SINGLE)  PREGNANCY, URINE    Imaging Review Dg Chest 2 View  09/10/2015  CLINICAL DATA:  Acute onset of generalized chest pain and shortness of breath. Drowsiness. Initial encounter. EXAM: CHEST  2 VIEW COMPARISON:  CTA of the chest performed 04/10/2015 FINDINGS: Right midlung density, measuring 3.1 cm, demonstrates increased density compared to prior studies. This may reflect residual or recurrent cavitating pneumonia or abscess, though a mass cannot be excluded. No pleural effusion or pneumothorax is seen. The heart is normal in size. No acute osseous abnormalities are identified. IMPRESSION: Right midlung density, measuring 3.1 cm, demonstrates increased density compared to prior studies.  This may reflect residual or recurrent cavitating pneumonia or abscess, though a mass cannot be excluded. CT of the chest could be considered for further evaluation, when and as deemed clinically appropriate. Electronically Signed   By: Roanna Raider M.D.   On:  09/10/2015 23:23   Ct Angio Chest Pe W/cm &/or Wo Cm  09/11/2015  CLINICAL DATA:  Chronic nonradiating chest pain, fever, shortness of breath, cough and mild abdominal pain. Initial encounter. EXAM: CT ANGIOGRAPHY CHEST WITH CONTRAST TECHNIQUE: Multidetector CT imaging of the chest was performed using the standard protocol during bolus administration of intravenous contrast. Multiplanar CT image reconstructions and MIPs were obtained to evaluate the vascular anatomy. CONTRAST:  50mL OMNIPAQUE IOHEXOL 350 MG/ML SOLN COMPARISON:  Chest radiograph performed 09/10/2015, and CTA of the chest performed 04/10/2015 FINDINGS: There is no evidence of significant pulmonary embolus. Multiple nodular opacities are noted scattered throughout the lungs, many of which demonstrate central cavitation, most compatible with diffuse bilateral septic emboli. Underlying trace right-sided pleural fluid is noted. No pneumothorax is seen. Scattered coronary artery calcifications are seen. Prominent subcarinal and right hilar nodes are seen, measuring 1.6 cm and 1.2 cm in short axis. No pericardial effusion is identified. The great vessels are grossly unremarkable in appearance. Postoperative change is noted about the right atrium and right ventricle. No axillary lymphadenopathy is seen. The visualized portions of the thyroid gland are unremarkable in appearance. The visualized portions of the liver are unremarkable. The spleen is somewhat bulky, but only borderline prominent in length. Trace ascites is suggested noted tracking about the liver and spleen. No acute osseous abnormalities are seen. Review of the MIP images confirms the above findings. IMPRESSION: 1. No evidence of  significant pulmonary embolus. 2. Multiple nodular opacities scattered throughout the lungs, many of which demonstrate central cavitation, most compatible with diffuse bilateral septic emboli, given the patient's history. 3. Underlying trace right-sided pleural fluid noted. 4. Prominent mediastinal and right hilar nodes noted, likely reflecting the underlying infection. 5. Scattered coronary artery calcifications seen. 6. Suggestion of trace ascites at the upper abdomen. These results were called by telephone at the time of interpretation on 09/11/2015 at 2:49 am to Dr. Zadie Rhine, who verbally acknowledged these results. Electronically Signed   By: Roanna Raider M.D.   On: 09/11/2015 02:49   I have personally reviewed and evaluated these images and lab results as part of my medical decision-making.   EKG Interpretation   Date/Time:  Friday September 10 2015 21:49:31 EDT Ventricular Rate:  93 PR Interval:  136 QRS Duration: 92 QT Interval:  392 QTC Calculation: 487 R Axis:   101 Text Interpretation:  Normal sinus rhythm Incomplete right bundle branch  block Possible Right ventricular hypertrophy Prolonged QT Abnormal ECG  Confirmed by Bebe Shaggy  MD, Dorinda Hill ([id number]) on 09/10/2015 11:34:55 PM      MDM   Final diagnoses:  Septic embolism (HCC)  Dehydration  Endocarditis      Nursing notes including past medical history and social history reviewed and considered in documentation xrays/imaging reviewed by myself and considered during evaluation Labs/vital reviewed myself and considered during evaluation   I personally performed the services described in this documentation, which was scribed in my presence. The recorded information has been reviewed and is accurate.        Zadie Rhine, MD 09/11/15 [phone]

## 2015-09-10 NOTE — ED Notes (Signed)
The pt is very difficult to get a history from

## 2015-09-10 NOTE — ED Notes (Signed)
Pt c/o chest pain especially with breathing

## 2015-09-11 ENCOUNTER — Encounter (HOSPITAL_COMMUNITY): Payer: Self-pay | Admitting: Radiology

## 2015-09-11 ENCOUNTER — Inpatient Hospital Stay (HOSPITAL_COMMUNITY): Payer: Self-pay

## 2015-09-11 ENCOUNTER — Emergency Department (HOSPITAL_COMMUNITY): Payer: Self-pay

## 2015-09-11 DIAGNOSIS — I2690 Septic pulmonary embolism without acute cor pulmonale: Secondary | ICD-10-CM

## 2015-09-11 DIAGNOSIS — I339 Acute and subacute endocarditis, unspecified: Secondary | ICD-10-CM | POA: Insufficient documentation

## 2015-09-11 DIAGNOSIS — R9431 Abnormal electrocardiogram [ECG] [EKG]: Secondary | ICD-10-CM | POA: Diagnosis present

## 2015-09-11 DIAGNOSIS — I4581 Long QT syndrome: Secondary | ICD-10-CM

## 2015-09-11 DIAGNOSIS — N179 Acute kidney failure, unspecified: Secondary | ICD-10-CM

## 2015-09-11 DIAGNOSIS — D649 Anemia, unspecified: Secondary | ICD-10-CM | POA: Insufficient documentation

## 2015-09-11 DIAGNOSIS — D696 Thrombocytopenia, unspecified: Secondary | ICD-10-CM

## 2015-09-11 DIAGNOSIS — Z515 Encounter for palliative care: Secondary | ICD-10-CM | POA: Insufficient documentation

## 2015-09-11 DIAGNOSIS — F1120 Opioid dependence, uncomplicated: Secondary | ICD-10-CM

## 2015-09-11 DIAGNOSIS — E876 Hypokalemia: Secondary | ICD-10-CM

## 2015-09-11 HISTORY — DX: Acute kidney failure, unspecified: N17.9

## 2015-09-11 LAB — I-STAT ARTERIAL BLOOD GAS, ED
Acid-base deficit: 7 mmol/L — ABNORMAL HIGH (ref 0.0–2.0)
Acid-base deficit: 6 mmol/L — ABNORMAL HIGH (ref 0.0–2.0)
Bicarbonate: 14.6 mEq/L — ABNORMAL LOW (ref 20.0–24.0)
Bicarbonate: 16.2 meq/L — ABNORMAL LOW (ref 20.0–24.0)
O2 Saturation: 89 %
O2 Saturation: 91 %
PCO2 ART: 20.1 mmHg — AB (ref 35.0–45.0)
Patient temperature: 99.1
Patient temperature: 99.1
TCO2: 15 mmol/L (ref 0–100)
TCO2: 17 mmol/L (ref 0–100)
pCO2 arterial: 23.4 mmHg — ABNORMAL LOW (ref 35.0–45.0)
pH, Arterial: 7.47 — ABNORMAL HIGH (ref 7.350–7.450)
pH, Arterial: 7.451 — ABNORMAL HIGH (ref 7.350–7.450)
pO2, Arterial: 51 mmHg — ABNORMAL LOW (ref 80.0–100.0)
pO2, Arterial: 58 mmHg — ABNORMAL LOW (ref 80.0–100.0)

## 2015-09-11 LAB — COMPREHENSIVE METABOLIC PANEL
ALBUMIN: 2.2 g/dL — AB (ref 3.5–5.0)
ALK PHOS: 72 U/L (ref 38–126)
ALT: 17 U/L (ref 14–54)
AST: 21 U/L (ref 15–41)
Anion gap: 11 (ref 5–15)
BILIRUBIN TOTAL: 0.9 mg/dL (ref 0.3–1.2)
BUN: 19 mg/dL (ref 6–20)
CALCIUM: 7.6 mg/dL — AB (ref 8.9–10.3)
CO2: 15 mmol/L — ABNORMAL LOW (ref 22–32)
Chloride: 114 mmol/L — ABNORMAL HIGH (ref 101–111)
Creatinine, Ser: 0.97 mg/dL (ref 0.44–1.00)
GFR calc Af Amer: >60 mL/min (ref >60)
GLUCOSE: 106 mg/dL — AB (ref 65–99)
POTASSIUM: 3.2 mmol/L — AB (ref 3.5–5.1)
Sodium: 140 mmol/L (ref 135–145)
TOTAL PROTEIN: 6.1 g/dL — AB (ref 6.5–8.1)

## 2015-09-11 LAB — CBC WITH DIFFERENTIAL/PLATELET
Basophils Absolute: 0 10*3/uL (ref 0.0–0.1)
Basophils Relative: 0 %
EOS PCT: 0 %
Eosinophils Absolute: 0 10*3/uL (ref 0.0–0.7)
HEMATOCRIT: 32 % — AB (ref 36.0–46.0)
HEMOGLOBIN: 10.4 g/dL — AB (ref 12.0–15.0)
LYMPHS ABS: 1.1 10*3/uL (ref 0.7–4.0)
LYMPHS PCT: 11 %
MCH: 23.9 pg — ABNORMAL LOW (ref 26.0–34.0)
MCHC: 32.5 g/dL (ref 30.0–36.0)
MCV: 73.4 fL — AB (ref 78.0–100.0)
MONOS PCT: 13 %
Monocytes Absolute: 1.3 10*3/uL — ABNORMAL HIGH (ref 0.1–1.0)
NEUTROS ABS: 7.7 10*3/uL (ref 1.7–7.7)
Neutrophils Relative %: 76 %
Platelets: 53 10*3/uL — ABNORMAL LOW (ref 150–400)
RBC: 4.36 MIL/uL (ref 3.87–5.11)
RDW: 17.8 % — AB (ref 11.5–15.5)
WBC: 10.1 10*3/uL (ref 4.0–10.5)

## 2015-09-11 LAB — I-STAT BETA HCG BLOOD, ED (MC, WL, AP ONLY): HCG, QUANTITATIVE: 16.6 m[IU]/mL — AB (ref <5)

## 2015-09-11 LAB — LACTIC ACID, PLASMA
LACTIC ACID, VENOUS: 2 mmol/L (ref 0.5–2.0)
LACTIC ACID, VENOUS: 1.3 mmol/L (ref 0.5–2.0)
Lactic Acid, Venous: 2.1 mmol/L (ref 0.5–2.0)
Lactic Acid, Venous: 2.1 mmol/L (ref 0.5–2.0)

## 2015-09-11 LAB — PROCALCITONIN: PROCALCITONIN: 3.09 ng/mL

## 2015-09-11 LAB — APTT: aPTT: >200 s (ref 24–37)

## 2015-09-11 LAB — GLUCOSE, CAPILLARY: Glucose-Capillary: 127 mg/dL — ABNORMAL HIGH (ref 65–99)

## 2015-09-11 LAB — HCG, QUANTITATIVE, PREGNANCY: hCG, Beta Chain, Quant, S: 7 m[IU]/mL — ABNORMAL HIGH (ref <5)

## 2015-09-11 LAB — MAGNESIUM: Magnesium: 1.5 mg/dL — ABNORMAL LOW (ref 1.7–2.4)

## 2015-09-11 LAB — TROPONIN I
Troponin I: 0.03 ng/mL (ref <0.031)
Troponin I: 0.03 ng/mL (ref <0.031)
Troponin I: 0.14 ng/mL — ABNORMAL HIGH (ref <0.031)

## 2015-09-11 LAB — CALCIUM: CALCIUM: 7.4 mg/dL — AB (ref 8.9–10.3)

## 2015-09-11 LAB — CBG MONITORING, ED: Glucose-Capillary: 99 mg/dL (ref 65–99)

## 2015-09-11 LAB — CORTISOL: Cortisol, Plasma: 55.8 ug/dL

## 2015-09-11 LAB — I-STAT CG4 LACTIC ACID, ED
Lactic Acid, Venous: 3.63 mmol/L (ref 0.5–2.0)
Lactic Acid, Venous: 1.22 mmol/L (ref 0.5–2.0)

## 2015-09-11 LAB — MRSA PCR SCREENING: MRSA by PCR: POSITIVE — AB

## 2015-09-11 MED ORDER — PIPERACILLIN-TAZOBACTAM 3.375 G IVPB
3.3750 g | Freq: Three times a day (TID) | INTRAVENOUS | Status: DC
  Administered 2015-09-11 – 2015-09-12 (×3): 3.375 g via INTRAVENOUS
  Filled 2015-09-11 (×5): qty 50

## 2015-09-11 MED ORDER — HYDROMORPHONE HCL 2 MG PO TABS: 4.0000 mg | ORAL_TABLET | Freq: Four times a day (QID) | ORAL | Status: DC

## 2015-09-11 MED ORDER — POTASSIUM CHLORIDE 20 MEQ/15ML (10%) PO SOLN
40.0000 meq | Freq: Two times a day (BID) | ORAL | Status: AC
  Administered 2015-09-11 – 2015-09-12 (×2): 40 meq via ORAL
  Filled 2015-09-11 (×4): qty 30

## 2015-09-11 MED ORDER — MORPHINE SULFATE (PF) 2 MG/ML IV SOLN
0.5000 mg | INTRAVENOUS | Status: DC | PRN
  Administered 2015-09-11: 0.5 mg via INTRAVENOUS
  Filled 2015-09-11: qty 1

## 2015-09-11 MED ORDER — SODIUM CHLORIDE 0.9 % IV BOLUS (SEPSIS)
1000.0000 mL | INTRAVENOUS | Status: AC
  Administered 2015-09-11 (×2): 1000 mL via INTRAVENOUS

## 2015-09-11 MED ORDER — METHOCARBAMOL 500 MG PO TABS
500.0000 mg | ORAL_TABLET | Freq: Three times a day (TID) | ORAL | Status: DC
  Administered 2015-09-11: 500 mg via ORAL
  Filled 2015-09-11: qty 1

## 2015-09-11 MED ORDER — HYDROMORPHONE HCL 2 MG PO TABS: 4.0000 mg | ORAL_TABLET | Freq: Four times a day (QID) | ORAL | Status: DC | PRN

## 2015-09-11 MED ORDER — METHYLPREDNISOLONE SODIUM SUCC 125 MG IJ SOLR
60.0000 mg | Freq: Four times a day (QID) | INTRAMUSCULAR | Status: DC
  Administered 2015-09-11: 60 mg via INTRAVENOUS
  Filled 2015-09-11: qty 2

## 2015-09-11 MED ORDER — SODIUM CHLORIDE 0.9 % IV BOLUS (SEPSIS)
500.0000 mL | INTRAVENOUS | Status: AC
  Administered 2015-09-11: 500 mL via INTRAVENOUS

## 2015-09-11 MED ORDER — ARGATROBAN 50 MG/50ML IV SOLN
0.5000 ug/kg/min | INTRAVENOUS | Status: DC
  Administered 2015-09-11: 0.5 ug/kg/min via INTRAVENOUS
  Filled 2015-09-11: qty 50

## 2015-09-11 MED ORDER — POLYETHYLENE GLYCOL 3350 17 G PO PACK
17.0000 g | PACK | Freq: Every day | ORAL | Status: DC | PRN
  Filled 2015-09-11: qty 1

## 2015-09-11 MED ORDER — VANCOMYCIN HCL IN DEXTROSE 1-5 GM/200ML-% IV SOLN
1000.0000 mg | Freq: Three times a day (TID) | INTRAVENOUS | Status: DC
  Administered 2015-09-11 – 2015-09-13 (×6): 1000 mg via INTRAVENOUS
  Filled 2015-09-11 (×9): qty 200

## 2015-09-11 MED ORDER — HYDROCODONE-ACETAMINOPHEN 5-325 MG PO TABS
1.0000 | ORAL_TABLET | ORAL | Status: DC | PRN
  Administered 2015-09-11: 1 via ORAL
  Filled 2015-09-11: qty 1

## 2015-09-11 MED ORDER — FUROSEMIDE 10 MG/ML IJ SOLN
40.0000 mg | Freq: Once | INTRAMUSCULAR | Status: AC
  Administered 2015-09-11: 40 mg via INTRAVENOUS
  Filled 2015-09-11: qty 4

## 2015-09-11 MED ORDER — POTASSIUM CHLORIDE IN NACL 40-0.9 MEQ/L-% IV SOLN
INTRAVENOUS | Status: DC
  Administered 2015-09-11: 100 mL/h via INTRAVENOUS
  Filled 2015-09-11: qty 1000

## 2015-09-11 MED ORDER — ARGATROBAN 50 MG/50ML IV SOLN
0.2500 ug/kg/min | INTRAVENOUS | Status: DC
  Administered 2015-09-11: 0.25 ug/kg/min via INTRAVENOUS

## 2015-09-11 MED ORDER — HYDROMORPHONE HCL 2 MG PO TABS: 4.0000 mg | ORAL_TABLET | ORAL | Status: DC | PRN

## 2015-09-11 MED ORDER — FUROSEMIDE 10 MG/ML IJ SOLN: 40.0000 mg | Freq: Once | INTRAMUSCULAR | Status: DC

## 2015-09-11 MED ORDER — HYDROMORPHONE HCL 2 MG PO TABS: 2.0000 mg | ORAL_TABLET | Freq: Four times a day (QID) | ORAL | Status: DC | PRN

## 2015-09-11 MED ORDER — VANCOMYCIN HCL IN DEXTROSE 1-5 GM/200ML-% IV SOLN
1000.0000 mg | Freq: Once | INTRAVENOUS | Status: AC
  Administered 2015-09-11: 1000 mg via INTRAVENOUS
  Filled 2015-09-11: qty 200

## 2015-09-11 MED ORDER — SODIUM CHLORIDE 0.9% FLUSH
3.0000 mL | Freq: Two times a day (BID) | INTRAVENOUS | Status: DC
  Administered 2015-09-11 – 2015-09-21 (×10): 3 mL via INTRAVENOUS

## 2015-09-11 MED ORDER — ONDANSETRON HCL 4 MG PO TABS: 4.0000 mg | ORAL_TABLET | Freq: Four times a day (QID) | ORAL | Status: DC | PRN

## 2015-09-11 MED ORDER — CLONAZEPAM 0.5 MG PO TABS
0.5000 mg | ORAL_TABLET | Freq: Every day | ORAL | Status: DC
  Administered 2015-09-12 – 2015-09-15 (×4): 0.5 mg via ORAL
  Filled 2015-09-11 (×4): qty 1

## 2015-09-11 MED ORDER — ACETAMINOPHEN 325 MG PO TABS: 650.0000 mg | ORAL_TABLET | Freq: Once | ORAL | Status: DC

## 2015-09-11 MED ORDER — BISACODYL 5 MG PO TBEC
5.0000 mg | DELAYED_RELEASE_TABLET | Freq: Every day | ORAL | Status: DC | PRN
  Filled 2015-09-11: qty 1

## 2015-09-11 MED ORDER — ACETAMINOPHEN 650 MG RE SUPP: 650.0000 mg | Freq: Four times a day (QID) | RECTAL | Status: DC | PRN

## 2015-09-11 MED ORDER — ACETAMINOPHEN 325 MG PO TABS: 650.0000 mg | ORAL_TABLET | Freq: Four times a day (QID) | ORAL | Status: DC | PRN

## 2015-09-11 MED ORDER — SODIUM CHLORIDE 0.9 % IV BOLUS (SEPSIS)
1000.0000 mL | Freq: Once | INTRAVENOUS | Status: AC
Start: 2015-09-11 — End: 2015-09-11
  Administered 2015-09-11: 1000 mL via INTRAVENOUS

## 2015-09-11 MED ORDER — ALBUTEROL SULFATE (2.5 MG/3ML) 0.083% IN NEBU
2.5000 mg | INHALATION_SOLUTION | Freq: Once | RESPIRATORY_TRACT | Status: AC
  Administered 2015-09-11: 2.5 mg via RESPIRATORY_TRACT
  Filled 2015-09-11: qty 3

## 2015-09-11 MED ORDER — MUPIROCIN 2 % EX OINT
1.0000 "application " | TOPICAL_OINTMENT | Freq: Two times a day (BID) | CUTANEOUS | Status: AC
  Administered 2015-09-12 – 2015-09-16 (×10): 1 via NASAL
  Filled 2015-09-11 (×2): qty 22

## 2015-09-11 MED ORDER — SODIUM BICARBONATE 8.4 % IV SOLN
INTRAVENOUS | Status: DC
  Administered 2015-09-11 (×2): via INTRAVENOUS
  Filled 2015-09-11: qty 1000

## 2015-09-11 MED ORDER — HYDROCORTISONE NA SUCCINATE PF 100 MG IJ SOLR
50.0000 mg | Freq: Four times a day (QID) | INTRAMUSCULAR | Status: DC
  Administered 2015-09-11 – 2015-09-12 (×2): 50 mg via INTRAVENOUS
  Filled 2015-09-11: qty 1
  Filled 2015-09-11: qty 2
  Filled 2015-09-11: qty 1

## 2015-09-11 MED ORDER — ONDANSETRON 4 MG PO TBDP
4.0000 mg | ORAL_TABLET | Freq: Once | ORAL | Status: AC
  Administered 2015-09-11: 4 mg via ORAL
  Filled 2015-09-11: qty 1

## 2015-09-11 MED ORDER — MORPHINE SULFATE (PF) 2 MG/ML IV SOLN
1.0000 mg | INTRAVENOUS | Status: DC | PRN
  Administered 2015-09-11 (×4): 1 mg via INTRAVENOUS
  Filled 2015-09-11 (×4): qty 1

## 2015-09-11 MED ORDER — IOHEXOL 350 MG/ML SOLN
80.0000 mL | Freq: Once | INTRAVENOUS | Status: AC | PRN
  Administered 2015-09-11: 50 mL via INTRAVENOUS

## 2015-09-11 MED ORDER — DEXTROSE 5 % IV SOLN
2.0000 g | Freq: Once | INTRAVENOUS | Status: AC
  Administered 2015-09-11: 2 g via INTRAVENOUS
  Filled 2015-09-11: qty 2

## 2015-09-11 MED ORDER — ONDANSETRON HCL 4 MG/2ML IJ SOLN
4.0000 mg | Freq: Four times a day (QID) | INTRAMUSCULAR | Status: DC | PRN
  Administered 2015-09-11 – 2015-09-12 (×2): 4 mg via INTRAVENOUS
  Filled 2015-09-11 (×2): qty 2

## 2015-09-11 MED ORDER — LORAZEPAM 1 MG PO TABS: 0.5000 mg | ORAL_TABLET | Freq: Two times a day (BID) | ORAL | Status: DC | PRN

## 2015-09-11 MED ORDER — CHLORHEXIDINE GLUCONATE CLOTH 2 % EX PADS
6.0000 | MEDICATED_PAD | Freq: Every day | CUTANEOUS | Status: AC
Start: 2015-09-12 — End: 2015-09-16
  Administered 2015-09-12 – 2015-09-16 (×5): 6 via TOPICAL

## 2015-09-11 MED ORDER — HYDROMORPHONE HCL 2 MG PO TABS
4.0000 mg | ORAL_TABLET | ORAL | Status: AC | PRN
  Administered 2015-09-11 (×2): 4 mg via ORAL
  Filled 2015-09-11 (×2): qty 2

## 2015-09-11 MED ORDER — MAGNESIUM SULFATE 2 GM/50ML IV SOLN
2.0000 g | Freq: Once | INTRAVENOUS | Status: AC
Start: 2015-09-11 — End: 2015-09-11
  Administered 2015-09-11: 2 g via INTRAVENOUS
  Filled 2015-09-11: qty 50

## 2015-09-11 NOTE — ED Notes (Signed)
Pt using phone while speaking with admitting hospitalist.

## 2015-09-11 NOTE — ED Notes (Signed)
Checked CBG 99 informed RN Marylene LandAngela

## 2015-09-11 NOTE — Progress Notes (Addendum)
Pharmacy Antibiotic Note  Chyrl CivatteJoann Verdie Shireheresa Lyons is a 40 y.o. female admitted on 09/10/2015 with sepsis likely secondary to endocarditis.  Pharmacy has been consulted for Vancomycin/Zosyn dosing. Pt has a history of endocarditis/bacteremia. Was previously on Vancomycin/Zosyn in November 2016. CT Angio with likely diffuse septic emboli.   Plan: -Vancomycin 1000 mg IV q8h (based on previous levels) -Zosyn 3.375G IV q8h to be infused over 4 hours -Trend WBC, temp, renal function  -Drug levels at steady state    Temp (24hrs), Avg:98.7 F (37.1 C), Min:98.7 F (37.1 C), Max:98.7 F (37.1 C)   Recent Labs Lab 09/10/15 2220 09/10/15 2239 09/11/15 0127  WBC 6.0  --   --   CREATININE 0.79  --   --   LATICACIDVEN  --  2.46* 3.63*    CrCl cannot be calculated (Unknown ideal weight.).    No Known Allergies   Abran DukeLedford, Zari Cly 09/11/2015 6:40 AM

## 2015-09-11 NOTE — ED Notes (Signed)
Dr Mahala MenghiniSamtani notified this RN to place pt on bipap until critical care providers can see.  Notified RT who has bipap at bedside.  Notified bed control of change to ICU bed status.

## 2015-09-11 NOTE — Progress Notes (Signed)
ABG    Component Value Date/Time   PHART 7.470* 09/11/2015 1540   PCO2ART 20.1* 09/11/2015 1540   PO2ART 51.0* 09/11/2015 1540   HCO3 14.6* 09/11/2015 1540   TCO2 15 09/11/2015 1540   ACIDBASEDEF 7.0* 09/11/2015 1540   O2SAT 89.0 09/11/2015 1540    Mixed resp failure With ? wob, fear will need intubation-have paged CCM  Resp to start on Bi-pap until can be assessed by CCM CXr pending  3:47 PM   Pleas KochJai Casmere Hollenbeck, MD [facility] [phone]

## 2015-09-11 NOTE — Consult Note (Signed)
Consultation Note Date: 09/11/2015   Patient Name: Melanie Crane  DOB: [birth year]  MRN: [id number]  Age / Sex: 40 y.o., female  PCP: No Pcp Per Patient Referring Physician: Rhetta Mura, MD  Reason for Consultation: To assist with management of withdrawal from IV opioid usage    Clinical Assessment/Narrative: Patient had just left leg per chart review she was lethargic upon presentation but she is alert when I see her in the emergency room. She is beginning to show signs and symptoms of withdrawal and in terms of generalized muscle cramps aches, nausea, "I'm hurting". Patient received a chest x-ray which showed worsening right midlung densities and this was followed by CT scan which revealed multiple nodules with central cavitation consistent with diffuse bilateral septic emboli. We were consulted for advice on opioid withdrawal. Patient per report has been using Opana 40 mg 3 times a day which she crushes his and injects. Her extremities show multiple bruises as well as scabbed over injection sites. Her urine drug screen was positive for amphetamines, benzodiazepines, and opioids. Patient denied using drugs in the class of amphetamines and benzodiazepines. 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 She tells me she has been in treatment before. I presented this case to Dr. Romie Minus with whom I'm working today and based on what we think patient's opioid usage is we recommended Dilaudid 4 mg by mouth every 6 hours around-the-clock, as well as Klonopin 0.5 mg daily and Ativan 0.5 mg every 12 when necessary.The patient denied benzodiazepine usage; her urine drug screen was positive for this and withdrawal from benzodiazepines could significantly worsen her current clinical condition. Later in the day when I revisited patient at the bedside she had been tachypnea, and acidotic per arterial  blood gas. Patient was placed on BiPAP but could not tolerate this and pulled the mask off as well as could not tolerate a nonrebreather. Critical care medicine was notified of patient's acute condition. Previous Dilaudid and Ativan orders were modified by Dr. Mahala Menghini given clinical decline. I was present for a conversation that she and attending Dr. Mahala Menghini had with patient where she states that where she to clinically decline she would want CPR or defibrillation as well as intubation.  I asked patient if there was any family that she would like for me to call and she initially stated "they won't come". But she did later asked me to call her mother and gave me a number that stated that this telephone number was not set up for a voicemail. Per chart that we received from [facility] as well as after receiving permission from patient, I contacted her half brother Melanie Crane who lives in [place]. There are records in [place], care everywhere, from [facility] [place] relating to patient's medical history. Mr. Shelda Altes shared that patient is receiving Opana from her mother and that if her mother does come to visit she may bring her medication to the unit. He does state that he will make an effort to try to find her mother since she is so critically ill. I shared this information about her mother potentially bringing opioids to patient with her attending as well as her nurse. This was documented by the emergency room nurse.  Contacts/Participants in Discussion: Primary Decision Maker: Melanie Crane   Relationship to Patient pt HCPOA: Not clear Patient states she is unmarried, has 3 minor children who are not in her custody, ages 28, 41 and 52. Per her half brother she has  2 living siblings. She had one sister who died from opioid overdose  SUMMARY OF RECOMMENDATIONS Full scope of care including intubation and CPR Palliative medicine to assist in any way we can but this patient is so critically ill, we will  defer opioid recommendations until she is more stable Hopefully patient will survive this acute event, and we can present to her further treatment for her opioid addiction  Code Status/Advance Care Planning: Full code    Code Status Orders        Start     Ordered   09/11/15 0552  Full code   Continuous     09/11/15 0556    Code Status History    Date Active Date Inactive Code Status Order ID Comments User Context   04/11/2015 12:29 AM 04/19/2015  4:06 PM Full Code [id number]  Alberteen Samhristopher P Danford, MD Inpatient   12/28/2013  4:50 AM 01/02/2014  4:20 PM Full Code [id number]  Lynden OxfordPranav Patel, MD Inpatient      Other Directives:None  Symptom Management:   Pain/withdrawal: We'll defer to critical care medicine and her attending given her clinic clinical condition. Would be happy to reengage when patient is more stable  Palliative Prophylaxis:   Aspiration, Bowel Regimen, Delirium Protocol, Frequent Pain Assessment, Oral Care and Turn Reposition  Additional Recommendations (Limitations, Scope, Preferences):  Full Scope Treatment  Long-term detox when medically stable  Psycho-social/Spiritual:  Support System: Poor Desire for further Chaplaincy support:no   Prognosis: Unable to determine  Discharge Planning: Unable to determine   Chief Complaint/ Primary Diagnoses: Present on Admission:  . Septic embolism (HCC) . Endocarditis . Hypokalemia . Hepatitis C . Thrombocytopenia, unspecified (HCC) . Anemia . IV drug abuse . Prolonged Q-T interval on ECG  I have reviewed the medical record, interviewed the patient and family, and examined the patient. The following aspects are pertinent.  Past Medical History  Diagnosis Date  . Anemia   . IVDU (intravenous drug user)   . 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 Hepatitis C   . Miscarriage     Five times   Social History   Social History  . Marital Status: Divorced     Spouse Name: N/A  . Number of Children: N/A  . Years of Education: N/A   Occupational History  . Unemployed    Social History Main Topics  . Smoking status: Current Every Day Smoker    Types: Cigarettes  . Smokeless tobacco: Never Used     Comment: 1 1/2 ppd  . Alcohol Use: No  . Drug Use: Yes    Special: IV     Comment: injectable opana in L forarm for 2-3 month  . Sexual Activity: Not Asked   Other Topics Concern  . None   Social History Narrative   [place] In [place] visiting family.   Family History  Problem Relation Age of Onset  . Hypertension Father   . Kidney failure Father   . Hyperlipidemia Mother   . CAD Mother   . Deafness Mother    Scheduled Meds: . clonazePAM  0.5 mg Oral Daily  . methylPREDNISolone (SOLU-MEDROL) injection  60 mg Intravenous Q6H  . sodium chloride flush  3 mL Intravenous Q12H   Continuous Infusions: . argatroban    . dextrose 5 % 1,000 mL with sodium bicarbonate 100 mEq infusion    . piperacillin-tazobactam (ZOSYN)  IV 3.375 g (09/11/15 1522)  .  vancomycin Stopped (09/11/15 0953)   PRN Meds:.acetaminophen **OR** acetaminophen, bisacodyl, morphine injection, [DISCONTINUED] ondansetron **OR** ondansetron (ZOFRAN) IV, polyethylene glycol Medications Prior to Admission:  Prior to Admission medications   Not on File   No Known Allergies  Review of Systems  Constitutional: Positive for chills and diaphoresis.  HENT: Positive for congestion.   Respiratory: Positive for shortness of breath.   Cardiovascular: Positive for palpitations.  Endocrine: Negative.   Genitourinary: Negative.   Musculoskeletal:       Hurting all over  Allergic/Immunologic: Negative.   Neurological: Negative.   Psychiatric/Behavioral:       Anxious, wanting to leave AMA    Physical Exam  Nursing note and vitals reviewed. Constitutional: She is oriented to person, place, and time. She appears well-developed and well-nourished.  HENT:   Head: Normocephalic and atraumatic.  Eyes: Pupils are equal, round, and reactive to light.  Neck: Normal range of motion.  Cardiovascular:  Tachy at times  Respiratory:  Increased work of breathing  Musculoskeletal: Normal range of motion.  Neurological: She is alert and oriented to person, place, and time.  Skin: Skin is warm and dry.  Multiple areas of bruising, scabs, injection sites  Psychiatric:  Anxious agitated    Vital Signs: BP 100/73 mmHg  Pulse 83  Temp(Src) 99.1 F (37.3 C) (Oral)  Resp 28  SpO2 97%  LMP 04/12/2015  SpO2: SpO2: 97 % O2 Device:SpO2: 97 % O2 Flow Rate: .O2 Flow Rate (L/min): 15 L/min  IO: Intake/output summary:  Intake/Output Summary (Last 24 hours) at 09/11/15 1834 Last data filed at 09/11/15 1755  Gross per 24 hour  Intake 3496.67 ml  Output   2600 ml  Net 896.67 ml    LBM:   Baseline Weight:   Most recent weight:        Palliative Assessment/Data:  Flowsheet Rows        Most Recent Value   Intake Tab    Referral Department  Hospitalist   Palliative Care Primary Diagnosis  Sepsis/Infectious Disease   Date Notified  09/11/15   Palliative Care Type  New Palliative care   Reason for referral  Pain   Date of Admission  09/10/15   Date first seen by Palliative Care  09/11/15   # of days Palliative referral response time  0 Day(s)   # of days IP prior to Palliative referral  1   Clinical Assessment    Palliative Performance Scale Score  50%   Pain Max last 24 hours  10   Pain Min Last 24 hours  10   Dyspnea Max Last 24 Hours  5   Dyspnea Min Last 24 hours  1   Nausea Max Last 24 Hours  4   Nausea Min Last 24 Hours  1   Anxiety Max Last 24 Hours  10   Anxiety Min Last 24 Hours  10   Psychosocial & Spiritual Assessment    Palliative Care Outcomes       Additional Data Reviewed:  CBC:    Component Value Date/Time   WBC 10.1 09/11/2015 1715   HGB 10.4* 09/11/2015 1715   HCT 32.0* 09/11/2015 1715   PLT 53* 09/11/2015 1715    MCV 73.4* 09/11/2015 1715   NEUTROABS PENDING 09/11/2015 1715   LYMPHSABS PENDING 09/11/2015 1715   MONOABS PENDING 09/11/2015 1715   EOSABS PENDING 09/11/2015 1715   BASOSABS PENDING 09/11/2015 1715   Comprehensive Metabolic Panel:    Component Value Date/Time   NA  140 09/11/2015 1715   K 3.2* 09/11/2015 1715   CL 114* 09/11/2015 1715   CO2 15* 09/11/2015 1715   BUN 19 09/11/2015 1715   CREATININE 0.97 09/11/2015 1715   GLUCOSE 106* 09/11/2015 1715   CALCIUM 7.6* 09/11/2015 1715   AST 21 09/11/2015 1715   ALT 17 09/11/2015 1715   ALKPHOS 72 09/11/2015 1715   BILITOT 0.9 09/11/2015 1715   PROT 6.1* 09/11/2015 1715   ALBUMIN 2.2* 09/11/2015 1715     Time In: 0930 Time Out: 1130 Time Total: 120 min Greater than 50%  of this time was spent counseling and coordinating care related to the above assessment and plan. Staffed with Dr. Neale Burly, and Dr. Mahala Menghini Case needs FU in terms of mother if she visits could potentially bring in drugs and give to pt. Additionally FU issues regarding mother going to pain clinic and possibly diverting her prescription. This information is second hand from pt's step-brother.  Signed by: Irean Hong, NP  Irean Hong, NP  09/11/2015, 6:34 PM  Please contact Palliative Medicine Team phone at [phone] for questions and concerns.

## 2015-09-11 NOTE — Progress Notes (Signed)
3:10 PM I agree with HPI/GPe and A/P per Dr. Antionette CharPYD  Seen examined Long h/o IVDU-injects opana  [crsuhes and injects] 40 mg tid [facility]  Admit with fever chills In attempt to fluid resus, and correct hypokalemia and AKI, developed iatrogenic vol overload ~ 1500 Dr. Radford PaxBeaton ED help appreicate-ABG pending, CXR 1 vw pending Lasix x1 given 3:13 PM   Consulted palliative care-given 2 x doses dilaudid 4 mg, ativan  Patient alert, oriented, coarse lung sounds ? wob In pain s1 s2 tach abd soft nt nd   Patient Active Problem List   Diagnosis Date Noted  . Prolonged Q-T interval on ECG 09/11/2015  . Normocytic anemia   . Pain in the chest   . Other iron deficiency anemias   . Acute septic pulmonary embolism without acute cor pulmonale (HCC)   . Sepsis (HCC)   . Prosthetic valve endocarditis (HCC)   . Embolism, pulmonary with infarction (HCC) 04/11/2015  . Endocarditis 04/11/2015  . Abscess of right lung with pneumonia (HCC)   . Pulmonary embolism (HCC) 04/10/2015  . Septic embolism (HCC) 04/10/2015  . Lung abscess (HCC) 04/10/2015  . PE (pulmonary embolism) 04/10/2015  . Hepatitis C 12/30/2013  . Hypokalemia 12/30/2013  . UTI (urinary tract infection) 12/30/2013  . Acute bacterial endocarditis - right sided 12/29/2013  . IV drug abuse 12/29/2013  . Anemia 12/29/2013  . Thrombocytopenia, unspecified (HCC) 12/29/2013  . Cigarette smoker 12/29/2013  . Shock (HCC) 12/28/2013  . Secondary amenorrhea 12/28/2013  . Bacterial myositis 12/28/2013   Will consult CCM if no better  Nursing to let me know if clinically worsens 3:22 PM

## 2015-09-11 NOTE — ED Notes (Signed)
Dr Samtani at bedside 

## 2015-09-11 NOTE — ED Notes (Signed)
[facility] contacted to call Code Sepsis 

## 2015-09-11 NOTE — Progress Notes (Signed)
[facility] Physician-Brief Progress Note Patient Name: Melanie RavelingJoann Theresa Roper DOB: 01/27/1976 MRN: [id number]   Date of Service  09/11/2015  HPI/Events of Note  Hypokalemia   eICU Interventions  replaced     Intervention Category Minor Interventions: Electrolytes abnormality - evaluation and management  Henry RusselSMITH, Ancel Easler, P 09/11/2015, 9:07 PM

## 2015-09-11 NOTE — H&P (Addendum)
PULMONARY / CRITICAL CARE MEDICINE   Name: Melanie RavelingJoann Theresa Crane MRN: [id number] DOB: 05/15/1976    ADMISSION DATE:  09/10/2015 CONSULTATION DATE:  09/11/2015  REFERRING MD:  TRH  CHIEF COMPLAINT:  Respiratory distress and chest pain.  HISTORY OF PRESENT ILLNESS:   40 year old female with 3 previous episodes of endocarditis that required open heart surgery who presents today with CP and SOB.  CT of the chest indicated multiple areas with cavitations consistent with septic emboli.  Patient reports continuing to use narcotic medication (opana) IV inspite of multiple previous episodes of endocarditis.  PAST MEDICAL HISTORY :  She  has a past medical history of Anemia; IVDU (intravenous drug user); Acute bacterial endocarditis - right sided (12/29/2013); Hepatitis C; and Miscarriage.  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  No Known Allergies  No current facility-administered medications on file prior to encounter.   No current outpatient prescriptions on file prior to encounter.    FAMILY HISTORY:  Her indicated that her mother is alive.   SOCIAL HISTORY: She  reports that she has been smoking Cigarettes.  She has never used smokeless tobacco. She reports that she uses illicit drugs (IV). She reports that she does not drink alcohol.  REVIEW OF SYSTEMS:   CP and SOB are the only positive, 12 point ROS is otherwise negative.  SUBJECTIVE:  In pain, asking for narcotics.  VITAL SIGNS: BP 102/76 mmHg  Pulse 96  Temp(Src) 99.1 F (37.3 C) (Oral)  Resp 30  SpO2 96%  LMP 04/12/2015  HEMODYNAMICS:    VENTILATOR SETTINGS: Vent Mode:  [-] BIPAP;PCV FiO2 (%):  [100 %] 100 % Set Rate:  [15 bmp] 15 bmp PEEP:  [5 cmH20] 5 cmH20 Pressure Support:  [10 cmH20] 10 cmH20 Plateau Pressure:  [16 cmH20] 16 cmH20  INTAKE / OUTPUT: I/O last 3  completed shifts: In: 2550 [IV Piggyback:2550] Out: -   PHYSICAL EXAMINATION: General:  Chronically ill appearing female who is much older than stated age. Neuro:  Alert and oriented x3, moving all ext to command. HEENT:  [place]/AT, PERRL, EOM-I and MMM. Cardiovascular:  RRR, Nl S1/S2, -M/R/G. Lungs:  Bibasilar crackles. Abdomen:  Soft, NT, ND and +BS. Musculoskeletal:  -edema and -tenderness. Skin:  Intact.  LABS:  BMET  Recent Labs Lab 09/10/15 2220  NA 139  K 3.2*  CL 113*  CO2 16*  BUN 27*  CREATININE 0.79  GLUCOSE 128*    Electrolytes  Recent Labs Lab 09/10/15 2220 09/11/15 0626  CALCIUM 8.0* 7.4*  MG  --  1.5*    CBC  Recent Labs Lab 09/10/15 2220 09/11/15 1715  WBC 6.0 10.1  HGB 10.8* 10.4*  HCT 34.6* 32.0*  PLT 44* 53*    Coag's  Recent Labs Lab 09/10/15 2220  INR 1.26    Sepsis Markers  Recent Labs Lab 09/11/15 0625 09/11/15 0626 09/11/15 0948 09/11/15 1730  LATICACIDVEN 2.1*  --  2.0 1.22  PROCALCITON  --  3.09  --   --     ABG  Recent Labs Lab 09/11/15 1540 09/11/15 1745  PHART 7.470* 7.451*  PCO2ART 20.1* 23.4*  PO2ART 51.0* 58.0*    Liver Enzymes No results for input(s): AST, ALT, ALKPHOS, BILITOT, ALBUMIN in the last 168 hours.  Cardiac Enzymes  Recent Labs Lab 09/10/15 2220 09/11/15 0626 09/11/15 1129  TROPONINI 0.19* 0.03 0.03    Glucose  Recent  Labs Lab 09/11/15 1544  GLUCAP 99    Imaging Dg Chest 2 View  09/10/2015  CLINICAL DATA:  Acute onset of generalized chest pain and shortness of breath. Drowsiness. Initial encounter. EXAM: CHEST  2 VIEW COMPARISON:  CTA of the chest performed 04/10/2015 FINDINGS: Right midlung density, measuring 3.1 cm, demonstrates increased density compared to prior studies. This may reflect residual or recurrent cavitating pneumonia or abscess, though a mass cannot be excluded. No pleural effusion or pneumothorax is seen. The heart is normal in size. No acute osseous  abnormalities are identified. IMPRESSION: Right midlung density, measuring 3.1 cm, demonstrates increased density compared to prior studies. This may reflect residual or recurrent cavitating pneumonia or abscess, though a mass cannot be excluded. CT of the chest could be considered for further evaluation, when and as deemed clinically appropriate. Electronically Signed   By: Roanna Raider M.D.   On: 09/10/2015 23:23   Ct Angio Chest Pe W/cm &/or Wo Cm  09/11/2015  CLINICAL DATA:  Chronic nonradiating chest pain, fever, shortness of breath, cough and mild abdominal pain. Initial encounter. EXAM: CT ANGIOGRAPHY CHEST WITH CONTRAST TECHNIQUE: Multidetector CT imaging of the chest was performed using the standard protocol during bolus administration of intravenous contrast. Multiplanar CT image reconstructions and MIPs were obtained to evaluate the vascular anatomy. CONTRAST:  50mL OMNIPAQUE IOHEXOL 350 MG/ML SOLN COMPARISON:  Chest radiograph performed 09/10/2015, and CTA of the chest performed 04/10/2015 FINDINGS: There is no evidence of significant pulmonary embolus. Multiple nodular opacities are noted scattered throughout the lungs, many of which demonstrate central cavitation, most compatible with diffuse bilateral septic emboli. Underlying trace right-sided pleural fluid is noted. No pneumothorax is seen. Scattered coronary artery calcifications are seen. Prominent subcarinal and right hilar nodes are seen, measuring 1.6 cm and 1.2 cm in short axis. No pericardial effusion is identified. The great vessels are grossly unremarkable in appearance. Postoperative change is noted about the right atrium and right ventricle. No axillary lymphadenopathy is seen. The visualized portions of the thyroid gland are unremarkable in appearance. The visualized portions of the liver are unremarkable. The spleen is somewhat bulky, but only borderline prominent in length. Trace ascites is suggested noted tracking about the liver  and spleen. No acute osseous abnormalities are seen. Review of the MIP images confirms the above findings. IMPRESSION: 1. No evidence of significant pulmonary embolus. 2. Multiple nodular opacities scattered throughout the lungs, many of which demonstrate central cavitation, most compatible with diffuse bilateral septic emboli, given the patient's history. 3. Underlying trace right-sided pleural fluid noted. 4. Prominent mediastinal and right hilar nodes noted, likely reflecting the underlying infection. 5. Scattered coronary artery calcifications seen. 6. Suggestion of trace ascites at the upper abdomen. These results were called by telephone at the time of interpretation on 09/11/2015 at 2:49 am to Dr. Zadie Rhine, who verbally acknowledged these results. Electronically Signed   By: Roanna Raider M.D.   On: 09/11/2015 02:49   Dg Chest Portable 1 View  09/11/2015  CLINICAL DATA:  Sepsis, endocarditis, fever EXAM: PORTABLE CHEST 1 VIEW COMPARISON:  Portable exam 1548 hours compared to 09/10/2015 and correlated with chest CT of 09/11/2015 FINDINGS: Normal size of cardiac silhouette post TVR. Mediastinal contours and pulmonary vascularity normal. Lungs clear. No pleural effusion or pneumothorax. Bones unremarkable. IMPRESSION: No acute abnormalities. Electronically Signed   By: Ulyses Southward M.D.   On: 09/11/2015 15:58   I reviewed chest CT myself, no PE, septic emboli with cavitations.  STUDIES:  CT  of the chest as above.  CULTURES: Blood 4/1>>> Urine 4/1>>> Sputum 4/1>>>  ANTIBIOTICS: Vancomycin 4/1>>> Zosyn 4/1>>>  SIGNIFICANT EVENTS: 4/1 admit to the hospital for likely endocarditis.  LINES/TUBES: PIV  DISCUSSION: 40 year female with multiple episodes of endocarditis requiring tricuspid valve replacement in the past who continues to use IV opana presenting with endocarditis and chest pain.  PCCM was consulted for hypoxemic respiratory failure.  CT without PE.    ASSESSMENT /  PLAN:  PULMONARY A: Acute hypoxemic respiratory failure due to septic emboli. P:   - High flow [place]. - Minimize sedation as able. - Make as dry as able. - CXR in AM.  CARDIOVASCULAR A:  Mild sinus tachycardia. P:  - IVF as needed. - Monitor for withdrawal.  RENAL A:   Hypokalemia Hypomag P:   - Replace K and Mg. - BMET in AM - Replace electrolytes as indicated. - KVO IVF.  GASTROINTESTINAL A:   No active issues. P:   - Heart healthy diet.NNo  HEMATOLOGIC A:   No active issues. P:  - CBC in AM. - Transfuse per ICU protocol.  INFECTIOUS A:   Fourth round of tricuspid valve endocarditis. P:   - ID consult called to see on 4/2. - Pan culture. - Vanc/zosyn. - D/C solumedrol. - Cortisol level. - Stress dose steroids.  ENDOCRINE A:   No active issues.   P:   - Monitor.  NEUROLOGIC A:   Narcotic addiction. P:   - Increase morphine to 1 mg/hr. - Monitor closely for evidence of withdrawal. - May require more narcotics and may need to start methadone.  FAMILY  - Updates: Spoke with patient, wishes full code status.  Discussed with TRH-MD, ok to admit to SDU and if deteriorates then will move to the ICU.  Alyson Reedy, M.D. [facility] Pulmonary/Critical Care Medicine. Pager: [phone]. After hours pager: [phone].  09/11/2015, 6:05 PM

## 2015-09-11 NOTE — ED Notes (Signed)
Port xray at bedside.  

## 2015-09-11 NOTE — Progress Notes (Signed)
RT placed patient on Bipap. Patient not able to tolerate the mask. Pt placed on 15 L nasal cannula sat 93%. MD and RN notified.

## 2015-09-11 NOTE — ED Notes (Signed)
MD at bedside updating patient.

## 2015-09-11 NOTE — ED Notes (Signed)
RT contacted d/t pts respiratory status, Radford PaxBeaton, MD notified to come to bedside d/t pts respiratory effort

## 2015-09-11 NOTE — ED Notes (Addendum)
Pt will not tolerate bipap machine per RT.  Pt placed on non-rebreather.

## 2015-09-11 NOTE — ED Notes (Signed)
Found, brother, Mliss SaxDavid Drum's phone number [phone] in paper work [facility], who spoke with Larence PenningBullard, NP.  Brother stated pt receives Opana from her mother who is prescribed the medication at a pain management clinic but only takes the medication to test positive to receive her next prescription and then gives out the rest.  Both pt and mother are homeless.  Pt's sister is deceased due to mother supplying opioid drugs.  One sister is in prison related to drugs and another sister was just released from prison related to drugs.  Brother states mother will likely give pt additional drugs if she visits during admission.

## 2015-09-11 NOTE — ED Notes (Addendum)
Pt not willing to keep non-rebreather on.  Eduard RouxSarah Bullard, NP at bedside.

## 2015-09-11 NOTE — ED Notes (Addendum)
Per Dr Mahala MenghiniSamtani, critical care Dr Molli KnockYacoub on his way to see pt.

## 2015-09-11 NOTE — Progress Notes (Signed)
ANTICOAGULATION CONSULT NOTE - Initial Consult  Pharmacy Consult for argatroban Indication: ?septic emboli, r/o PE  No Known Allergies  Patient Measurements:    Vital Signs: Temp: 99.1 F (37.3 C) (04/01 1440) Temp Source: Oral (04/01 1440) BP: 109/84 mmHg (04/01 1605) Pulse Rate: 113 (04/01 1605)  Labs:  Recent Labs  09/10/15 2220 09/11/15 0626 09/11/15 1129  HGB 10.8*  --   --   HCT 34.6*  --   --   PLT 44*  --   --   LABPROT 15.9*  --   --   INR 1.26  --   --   CREATININE 0.79  --   --   TROPONINI 0.19* 0.03 0.03    CrCl cannot be calculated (Unknown ideal weight.).   Medical History: Past Medical History  Diagnosis Date  . Anemia   . IVDU (intravenous drug user)   . 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 Hepatitis C   . Miscarriage     Five times    Medications:  Infusions:  . argatroban    . piperacillin-tazobactam (ZOSYN)  IV 3.375 g (09/11/15 1522)  . vancomycin Stopped (09/11/15 [id number])    Assessment: [age] yof with history of IVDA and DVTs not on anticoagulation PTA but had been in the fall. To start IV argatroban for now due to very low platelets that are inconsistent with labs in the past. She had been on heparin and lovenox in the past and platelets were normal in the past. She required very high doses of heparin in the past. She left AMA from another hospital yesterday and may have received lovenox there but records are unclear.   Goal of Therapy:  aPTT 50-90 seconds Monitor platelets by anticoagulation protocol: Yes   Plan:  - Argatroban 0.5mg /kg/min - Check a 2 hr aPTT - F/u care plan and need for anticoagulation  Emma-Lee Oddo, Drake Leachachel Lynn 09/11/2015,4:42 PM

## 2015-09-11 NOTE — Progress Notes (Addendum)
Discussed with critical care Dr. Katrinka BlazingSmith who recommends bicarbonate drip Also recommends further electrolytes monitoring given anion gap/non-anion gap acidosis and potential toxic ingestions? We will broaden her screening for other substances of abuse  When patient is not agitated respiratory rate drops into the high 20s however when moving or sitting up respiratory rate is in the 40s She will need a critical care bed and probable intubation and Precedex but I defer this to critical care medicine once they see her.  I started morphine 0.5 every when necessary and may benefit from PCA dosing? ABG repeat pending and ordered Portable chest x-ray pending Defer further management be CCM CXR shows no fluid-HOLD further lasix Also appreciate palliative medicine input into her psychosocial aspects of care  4:56 PM   Pleas KochJai Lela Murfin, MD [facility] [phone]) [phone]

## 2015-09-11 NOTE — Progress Notes (Signed)
CRITICAL VALUE ALERT  Critical value received:  Pediatric yellow top gram positive cocci in clusters  Date of notification:  09/11/15  Time of notification:  2110  Critical value read back: yes  Nurse who received alert: Julius BowelsFelicia Karo Rog, RN   MD notified (1st page):  Dr. Katrinka BlazingSmith  Time of first page:  2110

## 2015-09-11 NOTE — ED Notes (Signed)
Called xray for port chest.

## 2015-09-11 NOTE — ED Notes (Signed)
Pt has removed her monitoring equipment.  Reports she's had endocarditis in the past.

## 2015-09-11 NOTE — Progress Notes (Addendum)
ANTICOAGULATION CONSULT NOTE  Pharmacy Consult for argatroban Indication: ?septic emboli, r/o PE  No Known Allergies  Patient Measurements: Height: 5\' 4"  (162.6 cm) IBW/kg (Calculated) : 54.7  Vital Signs: Temp: 97.4 F (36.3 C) (04/01 1943) Temp Source: Oral (04/01 1943) BP: 117/92 mmHg (04/01 1900) Pulse Rate: 82 (04/01 1900)  Labs:  Recent Labs  09/10/15 2220 09/11/15 0626 09/11/15 1129 09/11/15 1715 09/11/15 2106  HGB 10.8*  --   --  10.4*  --   HCT 34.6*  --   --  32.0*  --   PLT 44*  --   --  53*  --   APTT  --   --   --   --  >200*  LABPROT 15.9*  --   --   --   --   INR 1.26  --   --   --   --   CREATININE 0.79  --   --  0.97  --   TROPONINI 0.19* 0.03 0.03 0.14*  --     CrCl cannot be calculated (Unknown ideal weight.).   Medical History: Past Medical History  Diagnosis Date  . Anemia   . IVDU (intravenous drug user)   . 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 Hepatitis C   . Miscarriage     Five times    Medications:  Infusions:  . argatroban 0.5 mcg/kg/min (09/11/15 1905)  . dextrose 5 % 1,000 mL with sodium bicarbonate 100 mEq infusion 100 mL/hr at 09/11/15 1903    Assessment: 39 yof with history of IVDA and DVTs not on anticoagulation PTA but had been in the fall. To start IV argatroban for now due to very low platelets that are inconsistent with labs in the past. She had been on heparin and lovenox in the past and platelets were normal in the past. She required very high doses of heparin in the past. She left AMA from another hospital yesterday and may have received lovenox there but records are unclear.   Initial aPTT is >200. Appears to have been drawn correctly per RN. Will hold x 30min per protocol and restart at reduced rate. Communicated with RN. No bleed reported.  Goal of Therapy:  aPTT 50-90 seconds Monitor platelets by anticoagulation protocol: Yes   Plan:  - Hold argatroban x 30  minutes - Restart argatroban at 0.25mg /kg/min (~50% decrease) at ~2245 - Check a 2 hr aPTT until 2 therapeutic levels obtained - Daily CBC/aPTT - F/u care plan and need for anticoagulation  Babs BertinHaley Akai Dollard, PharmD, BCPS Clinical Pharmacist Pager [phone] 09/11/2015 10:07 PM

## 2015-09-11 NOTE — ED Notes (Signed)
Dr. Yacoub at bedside  

## 2015-09-11 NOTE — ED Notes (Signed)
Pt requesting to have her mother called to come get her so she can leave.  [place], NP speaking with pt to call mother to speak with her about pt status.

## 2015-09-11 NOTE — H&P (Signed)
[facility] History and Physical  Melanie Crane GNF:[id number] DOB: 09/10/1975 DOA: 09/10/2015  Referring physician: ED physician PCP: No PCP Per Patient  Specialists: None listed   Chief Complaint:  Chest pain   HPI: Melanie Crane is a 40 y.o. female with PMH of IV drug abuse, chronic anemia, and hepatitis C who presents to the ED with chest pain, fevers, and malaise. Patient reports that she had just been admitted to a hospital in [place], but left AGAINST MEDICAL ADVICE due to disagreement with her treatment plan. Patient is lethargic upon presentation and additional history is difficult to elicit. Patient is had endocarditis previously and her notes obtained from the outside hospital, this was the leading suspicion in the current illness. Patient had been febrile at the outside facility.  In ED, patient was found to be afebrile, saturating adequately on room air, with blood pressure in the low 100s over 40s. Chest x-ray featured worsening right midlung densities and this was followed by CTA. Multiple nodules with central cavitation are noted on the CT chest, consistent with diffuse bilateral septic emboli. BMP is notable for a potassium of 3.2, bicarbonate of 16, and BUN to creatinine ratio of greater than 30. CBC features hemoglobin at 10.8 and platelet count of 44,000. Troponin is elevated to 0.19 and EKG features a normal sinus rhythm with incomplete right bundle branch block and QTc interval of 487 ms. Initial lactate is 2.4, then rising to 3.63. Patient was bolused with 30 cc/kg normal saline, blood cultures were obtained, and she was treated empirically with cefepime and vancomycin. Blood pressures improved following the NS bolus, she remained hemodynamically stable, and will be admitted to the stepdown unit for ongoing evaluation and treatment of sepsis suspected secondary to endocarditis with septic pulmonary emboli.   Where does patient live?   At home     Can patient  participate in ADLs?  Yes         Review of Systems:  Unable to obtain secondary to patient's poor cooperation, refusal to participate.     Allergy: No Known Allergies  Past Medical History  Diagnosis Date  . Anemia   . IVDU (intravenous drug user)   . 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 Hepatitis C   . Miscarriage     Five times    Past Surgical History  Procedure Laterality Date  . Bunionectomy    . Tonsillectomy    . 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, [place], [place]  . Tee without cardioversion N/A 04/14/2015    Procedure: TRANSESOPHAGEAL ECHOCARDIOGRAM (TEE);  Surgeon: Lewayne Bunting, MD;  Location: [facility] ENDOSCOPY;  Service: Cardiovascular;  Laterality: N/A;    Social History:  reports that she has been smoking Cigarettes.  She has never used smokeless tobacco. She reports that she uses illicit drugs (IV). She reports that she does not drink alcohol.  Family History:  Family History  Problem Relation Age of Onset  . Hypertension Father   . Kidney failure Father   . Hyperlipidemia Mother   . CAD Mother   . Deafness Mother      Prior to Admission medications   Not on File    Physical Exam: Filed Vitals:   09/11/15 0345 09/11/15 0400 09/11/15 0430 09/11/15 0500  BP: 129/93 142/97 124/90 126/95  Pulse: 95 98 91   Temp:      TempSrc:  Resp: 29 35 27 34  SpO2: 95% 98% 99%    General: Not in acute distress, though in apparent discomfort HEENT:       Eyes: PERRL, EOMI, no scleral icterus or conjunctival pallor.       ENT: No discharge from the ears or nose, no pharyngeal ulcers, oral mucosa moist       Neck: No JVD, no bruit, no appreciable mass Heme: No cervical adenopathy, no pallor Cardiac: S1/S2, RRR, grade III systolic murmur at lower sternal borders, No gallops or rubs. Pulm: Good air movement bilaterally. Rales b/l lung fields. Abd: Soft, nondistended, nontender, no  rebound pain or gaurding, BS present. Ext: No LE edema bilaterally. 2+DP/PT pulse bilaterally. Musculoskeletal: No gross deformity, no red, hot, swollen joints   Skin: No rashes on exposed surfaces. Punctate ecchymosis and scars overly veins of UEs Neuro: Lethargic, uncooperative, moving all extremities, no gross facial asymmetry. Normal tone throughout.  Psych: Agitated, uncooperative, does not make eye-contact.  Labs on Admission:  Basic Metabolic Panel:  Recent Labs Lab 09/10/15 2220  NA 139  K 3.2*  CL 113*  CO2 16*  GLUCOSE 128*  BUN 27*  CREATININE 0.79  CALCIUM 8.0*   Liver Function Tests: No results for input(s): AST, ALT, ALKPHOS, BILITOT, PROT, ALBUMIN in the last 168 hours. No results for input(s): LIPASE, AMYLASE in the last 168 hours. No results for input(s): AMMONIA in the last 168 hours. CBC:  Recent Labs Lab 09/10/15 2220  WBC 6.0  HGB 10.8*  HCT 34.6*  MCV 74.1*  PLT 44*   Cardiac Enzymes:  Recent Labs Lab 09/10/15 2220  TROPONINI 0.19*    BNP (last 3 results) No results for input(s): BNP in the last 8760 hours.  ProBNP (last 3 results) No results for input(s): PROBNP in the last 8760 hours.  CBG: No results for input(s): GLUCAP in the last 168 hours.  Radiological Exams on Admission: Dg Chest 2 View  09/10/2015  CLINICAL DATA:  Acute onset of generalized chest pain and shortness of breath. Drowsiness. Initial encounter. EXAM: CHEST  2 VIEW COMPARISON:  CTA of the chest performed 04/10/2015 FINDINGS: Right midlung density, measuring 3.1 cm, demonstrates increased density compared to prior studies. This may reflect residual or recurrent cavitating pneumonia or abscess, though a mass cannot be excluded. No pleural effusion or pneumothorax is seen. The heart is normal in size. No acute osseous abnormalities are identified. IMPRESSION: Right midlung density, measuring 3.1 cm, demonstrates increased density compared to prior studies. This may  reflect residual or recurrent cavitating pneumonia or abscess, though a mass cannot be excluded. CT of the chest could be considered for further evaluation, when and as deemed clinically appropriate. Electronically Signed   By: Roanna RaiderJeffery  Chang M.D.   On: 09/10/2015 23:23   Ct Angio Chest Pe W/cm &/or Wo Cm  09/11/2015  CLINICAL DATA:  Chronic nonradiating chest pain, fever, shortness of breath, cough and mild abdominal pain. Initial encounter. EXAM: CT ANGIOGRAPHY CHEST WITH CONTRAST TECHNIQUE: Multidetector CT imaging of the chest was performed using the standard protocol during bolus administration of intravenous contrast. Multiplanar CT image reconstructions and MIPs were obtained to evaluate the vascular anatomy. CONTRAST:  50mL OMNIPAQUE IOHEXOL 350 MG/ML SOLN COMPARISON:  Chest radiograph performed 09/10/2015, and CTA of the chest performed 04/10/2015 FINDINGS: There is no evidence of significant pulmonary embolus. Multiple nodular opacities are noted scattered throughout the lungs, many of which demonstrate central cavitation, most compatible with diffuse bilateral septic emboli. Underlying trace  right-sided pleural fluid is noted. No pneumothorax is seen. Scattered coronary artery calcifications are seen. Prominent subcarinal and right hilar nodes are seen, measuring 1.6 cm and 1.2 cm in short axis. No pericardial effusion is identified. The great vessels are grossly unremarkable in appearance. Postoperative change is noted about the right atrium and right ventricle. No axillary lymphadenopathy is seen. The visualized portions of the thyroid gland are unremarkable in appearance. The visualized portions of the liver are unremarkable. The spleen is somewhat bulky, but only borderline prominent in length. Trace ascites is suggested noted tracking about the liver and spleen. No acute osseous abnormalities are seen. Review of the MIP images confirms the above findings. IMPRESSION: 1. No evidence of significant  pulmonary embolus. 2. Multiple nodular opacities scattered throughout the lungs, many of which demonstrate central cavitation, most compatible with diffuse bilateral septic emboli, given the patient's history. 3. Underlying trace right-sided pleural fluid noted. 4. Prominent mediastinal and right hilar nodes noted, likely reflecting the underlying infection. 5. Scattered coronary artery calcifications seen. 6. Suggestion of trace ascites at the upper abdomen. These results were called by telephone at the time of interpretation on 09/11/2015 at 2:49 am to Dr. Zadie Rhine, who verbally acknowledged these results. Electronically Signed   By: Roanna Raider M.D.   On: 09/11/2015 02:49    EKG: Independently reviewed.  Abnormal findings:  Sinus rhythm, incomplete RBBB, QTc 487 ms   Assessment/Plan  1. Sepsis suspected secondary to endocarditis  - Meets criteria with fevers documented at [facility] earlier in the day, elevated lactate, tachypnea - Endocarditis suspected, murmur present; likely right-sided apparent septic emboli to lung  - Has history of this, underwent valve replacement, and had endocarditis of the prosthetic valve  - Blood cultures obtained, will follow  - Empiric cefepime and vanc initiated in ED, will continue with Zosyn and vancomycin  - She has history of pseudomonal infections, covering with Zosyn    2. 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 Anemia, thrombocytopenia  - Hgb is 10.8 on arrival and platelet count is 44,000 - Hgb is stable relative to priors; thrombocytopenia is new, possibly secondary to sepsis, though liver disease also a potential contributor in pt with known hep C   - No sign of active blood loss  - Holding pharmacologic VTE ppx for now  4. Hypokalemia  - Serum potassium 3.2 on admission, replacing  - Repeat chem  panel tomorrow  - Check mag level and replete prn   5. IV drug abuse  - Counseled regarding cessation   6. Prolonged QTc - QTc 487 ms on arrival  - Checking mag level, will replete prn  - Replacing potassium  - Opiate use could be contributing - Will need to exercise caution with opiates, antiemetics - Monitor on telemetry    DVT ppx: SQ Heparin     Code Status: Full code Family Communication: None at bed side.                Disposition Plan: Admit to inpatient   Date of Service 09/11/2015    Briscoe Deutscher, MD [facility] Pager [phone]  If 7PM-7AM, please contact night-coverage [email or web address] Password [email or web address] 09/11/2015, 5:56 AM

## 2015-09-12 ENCOUNTER — Inpatient Hospital Stay (HOSPITAL_COMMUNITY): Payer: Self-pay

## 2015-09-12 ENCOUNTER — Inpatient Hospital Stay (HOSPITAL_COMMUNITY): Payer: MEDICAID

## 2015-09-12 DIAGNOSIS — J9690 Respiratory failure, unspecified, unspecified whether with hypoxia or hypercapnia: Secondary | ICD-10-CM | POA: Insufficient documentation

## 2015-09-12 DIAGNOSIS — I330 Acute and subacute infective endocarditis: Secondary | ICD-10-CM

## 2015-09-12 DIAGNOSIS — B9561 Methicillin susceptible Staphylococcus aureus infection as the cause of diseases classified elsewhere: Secondary | ICD-10-CM

## 2015-09-12 DIAGNOSIS — A4101 Sepsis due to Methicillin susceptible Staphylococcus aureus: Secondary | ICD-10-CM | POA: Insufficient documentation

## 2015-09-12 DIAGNOSIS — N179 Acute kidney failure, unspecified: Secondary | ICD-10-CM | POA: Insufficient documentation

## 2015-09-12 DIAGNOSIS — Z952 Presence of prosthetic heart valve: Secondary | ICD-10-CM

## 2015-09-12 DIAGNOSIS — F1110 Opioid abuse, uncomplicated: Secondary | ICD-10-CM | POA: Insufficient documentation

## 2015-09-12 DIAGNOSIS — F19288 Other psychoactive substance dependence with other psychoactive substance-induced disorder: Secondary | ICD-10-CM

## 2015-09-12 DIAGNOSIS — I38 Endocarditis, valve unspecified: Secondary | ICD-10-CM | POA: Insufficient documentation

## 2015-09-12 DIAGNOSIS — J9601 Acute respiratory failure with hypoxia: Secondary | ICD-10-CM

## 2015-09-12 LAB — PHOSPHORUS: PHOSPHORUS: 3 mg/dL (ref 2.5–4.6)

## 2015-09-12 LAB — GLUCOSE, CAPILLARY
GLUCOSE-CAPILLARY: 108 mg/dL — AB (ref 65–99)
Glucose-Capillary: 119 mg/dL — ABNORMAL HIGH (ref 65–99)
Glucose-Capillary: 143 mg/dL — ABNORMAL HIGH (ref 65–99)
Glucose-Capillary: 194 mg/dL — ABNORMAL HIGH (ref 65–99)

## 2015-09-12 LAB — PROTIME-INR
INR: 2.26 — ABNORMAL HIGH (ref 0.00–1.49)
PROTHROMBIN TIME: 24.7 s — AB (ref 11.6–15.2)

## 2015-09-12 LAB — CBC
HEMATOCRIT: 30.1 % — AB (ref 36.0–46.0)
Hemoglobin: 9.4 g/dL — ABNORMAL LOW (ref 12.0–15.0)
MCH: 22.9 pg — ABNORMAL LOW (ref 26.0–34.0)
MCHC: 31.2 g/dL (ref 30.0–36.0)
MCV: 73.2 fL — ABNORMAL LOW (ref 78.0–100.0)
PLATELETS: 40 10*3/uL — AB (ref 150–400)
RBC: 4.11 MIL/uL (ref 3.87–5.11)
RDW: 17.5 % — AB (ref 11.5–15.5)
WBC: 6.5 10*3/uL (ref 4.0–10.5)

## 2015-09-12 LAB — BASIC METABOLIC PANEL
Anion gap: 11 (ref 5–15)
BUN: 18 mg/dL (ref 6–20)
CALCIUM: 7.6 mg/dL — AB (ref 8.9–10.3)
CO2: 18 mmol/L — ABNORMAL LOW (ref 22–32)
CREATININE: 0.9 mg/dL (ref 0.44–1.00)
Chloride: 110 mmol/L (ref 101–111)
GFR calc Af Amer: >60 mL/min (ref >60)
GLUCOSE: 157 mg/dL — AB (ref 65–99)
POTASSIUM: 3.2 mmol/L — AB (ref 3.5–5.1)
SODIUM: 139 mmol/L (ref 135–145)

## 2015-09-12 LAB — POCT I-STAT 3, ART BLOOD GAS (G3+)
Acid-base deficit: 5 mmol/L — ABNORMAL HIGH (ref 0.0–2.0)
Bicarbonate: 18.1 mEq/L — ABNORMAL LOW (ref 20.0–24.0)
O2 Saturation: 91 %
PCO2 ART: 25.2 mmHg — AB (ref 35.0–45.0)
PO2 ART: 56 mmHg — AB (ref 80.0–100.0)
Patient temperature: 98.3
TCO2: 19 mmol/L (ref 0–100)
pH, Arterial: 7.464 — ABNORMAL HIGH (ref 7.350–7.450)

## 2015-09-12 LAB — APTT
APTT: 133 s — AB (ref 24–37)
APTT: 110 s — AB (ref 24–37)

## 2015-09-12 LAB — MAGNESIUM: Magnesium: 1.8 mg/dL (ref 1.7–2.4)

## 2015-09-12 LAB — VOLATILES,BLD-ACETONE,ETHANOL,ISOPROP,METHANOL
ACETONE, BLOOD: NOT DETECTED % (ref 0.000–0.010)
Ethanol, blood: NOT DETECTED % (ref 0.000–0.010)
ISOPROPANOL, BLOOD: NOT DETECTED % (ref 0.000–0.010)
METHANOL, BLOOD: NOT DETECTED % (ref 0.000–0.010)

## 2015-09-12 LAB — RAPID HIV SCREEN (HIV 1/2 AB+AG)
HIV 1/2 Antibodies: NONREACTIVE
HIV-1 P24 ANTIGEN - HIV24: NONREACTIVE

## 2015-09-12 LAB — ECHOCARDIOGRAM COMPLETE
HEIGHTINCHES: 64 in
Weight: 2624.36 oz

## 2015-09-12 MED ORDER — ARGATROBAN 50 MG/50ML IV SOLN
0.1000 ug/kg/min | INTRAVENOUS | Status: DC
  Administered 2015-09-12: 0.1 ug/kg/min via INTRAVENOUS
  Filled 2015-09-12: qty 50

## 2015-09-12 MED ORDER — DIPHENHYDRAMINE HCL 12.5 MG/5ML PO ELIX
12.5000 mg | ORAL_SOLUTION | Freq: Four times a day (QID) | ORAL | Status: DC | PRN
  Filled 2015-09-12: qty 5

## 2015-09-12 MED ORDER — POTASSIUM CHLORIDE CRYS ER 20 MEQ PO TBCR
40.0000 meq | EXTENDED_RELEASE_TABLET | Freq: Two times a day (BID) | ORAL | Status: DC
  Administered 2015-09-12 – 2015-09-13 (×3): 40 meq via ORAL
  Filled 2015-09-12 (×8): qty 2

## 2015-09-12 MED ORDER — NALOXONE HCL 0.4 MG/ML IJ SOLN: 0.4000 mg | INTRAMUSCULAR | Status: DC | PRN

## 2015-09-12 MED ORDER — CEFAZOLIN SODIUM-DEXTROSE 2-4 GM/100ML-% IV SOLN
2.0000 g | Freq: Three times a day (TID) | INTRAVENOUS | Status: DC
  Administered 2015-09-12 – 2015-09-13 (×3): 2 g via INTRAVENOUS
  Filled 2015-09-12 (×5): qty 100

## 2015-09-12 MED ORDER — MAGNESIUM OXIDE 400 (241.3 MG) MG PO TABS
400.0000 mg | ORAL_TABLET | Freq: Every day | ORAL | Status: DC
  Administered 2015-09-12 – 2015-09-21 (×10): 400 mg via ORAL
  Filled 2015-09-12 (×10): qty 1

## 2015-09-12 MED ORDER — FUROSEMIDE 10 MG/ML IJ SOLN
40.0000 mg | Freq: Two times a day (BID) | INTRAMUSCULAR | Status: DC
  Administered 2015-09-12 – 2015-09-13 (×3): 40 mg via INTRAVENOUS
  Filled 2015-09-12 (×8): qty 4

## 2015-09-12 MED ORDER — MORPHINE SULFATE 2 MG/ML IV SOLN
INTRAVENOUS | Status: DC
  Administered 2015-09-12: 12 mg via INTRAVENOUS
  Administered 2015-09-13: 2 mg via INTRAVENOUS
  Filled 2015-09-12: qty 25

## 2015-09-12 MED ORDER — ONDANSETRON HCL 4 MG/2ML IJ SOLN: 4.0000 mg | Freq: Four times a day (QID) | INTRAMUSCULAR | Status: DC | PRN

## 2015-09-12 MED ORDER — SODIUM CHLORIDE 0.9 % IV SOLN
300.0000 mg | Freq: Three times a day (TID) | INTRAVENOUS | Status: DC
  Administered 2015-09-12 – 2015-09-19 (×20): 300 mg via INTRAVENOUS
  Filled 2015-09-12 (×23): qty 300

## 2015-09-12 MED ORDER — MORPHINE SULFATE 2 MG/ML IV SOLN
INTRAVENOUS | Status: DC
  Administered 2015-09-12: 9 mg via INTRAVENOUS
  Administered 2015-09-12: 12 mg via INTRAVENOUS
  Administered 2015-09-12: 6 mg via INTRAVENOUS
  Administered 2015-09-12: 9.5 mg via INTRAVENOUS
  Administered 2015-09-12: 3 mg via INTRAVENOUS
  Filled 2015-09-12 (×2): qty 25

## 2015-09-12 MED ORDER — DIPHENHYDRAMINE HCL 50 MG/ML IJ SOLN
12.5000 mg | Freq: Four times a day (QID) | INTRAMUSCULAR | Status: DC | PRN
  Administered 2015-09-12: 12.5 mg via INTRAVENOUS
  Filled 2015-09-12: qty 1

## 2015-09-12 MED ORDER — SODIUM CHLORIDE 0.9% FLUSH: 9.0000 mL | INTRAVENOUS | Status: DC | PRN

## 2015-09-12 MED ORDER — HYDROCORTISONE NA SUCCINATE PF 100 MG IJ SOLR
50.0000 mg | Freq: Two times a day (BID) | INTRAMUSCULAR | Status: DC
  Administered 2015-09-12 (×2): 50 mg via INTRAVENOUS
  Filled 2015-09-12 (×3): qty 1

## 2015-09-12 NOTE — Progress Notes (Signed)
eLink Physician-Brief Progress Note Patient Name: Melanie Crane DOB: 09/14/1975 MRN: [id number]   Date of Service  09/12/2015  HPI/Events of Note  Nurse contacted regarding frequent need for bolus IV morphine.  Patient known IV drug user. Camera check on patient shows her laying recumbent in bed eyes closed. Did wake up easily with stimulation. Patient complaining of diffuse pain.  eICU Interventions  1. DC when necessary IV morphine 2. Morphine PCA without Basal infusion     Intervention Category Intermediate Interventions: Pain - evaluation and management  [name] 09/12/2015, 12:04 AM

## 2015-09-12 NOTE — Consult Note (Signed)
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 Verlon Au   HPI: Melanie Crane is an 39 y.o. 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 Most recently she admitted admitted to [facility] in late October with a recent of history of pseudomonal bacteremia. She again had evidence of endocarditis and cardiothoracic surgery were consulted. While they felt that she would need valve repair they felt that this was pointless in the setting of her ongoing intravenous drug use. She was supposed to be treated with vancomycin and Zosyn at discharge the hospital at that time and was DC to [facility]  was supposed to follow-up with [place] in infectious disease but never was seen in house afterwards I'm not sure she finished her course of antibiotics or not.  Regardless she has been recently readmitted to [facility] in [facility] and found to have staphylococcal bacteremia with a vegetation on her prosthetic heart valve. She also had significant septic emboli to the lungs present.  When she presented to [facility] she was an acute renal failure as well although her renal failure improved with IV fluid hydration. She was treated with vancomycin and cefepime and then vancomycin rifampin and gentamicin. The notes from [facility] indicate that she was growing Staphylococcus aureus that I don't have the sensitivities on the organism I see him it was methicillin-resistant Staphylococcus aureus.  She then left [place] and separately presented here to [facility] emergency  department.  She is again growing apparent staph coccus aureus in her blood and has massive septic emboli on CT scan of her lungs  She had significant hypoxemia and has been admitted to the ICU for stepdown status. She had been on vancomycin and Zosyn which I narrowed to vancomycin and cefazolin prior to being able to examine her paper chart.  I did phone the [facility] attempt to reach someone in the microbiology lab but will pick up the phone on the other end.  .    Past Medical History  Diagnosis Date  . Anemia   . IVDU (intravenous drug user)   . 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 Hepatitis C   . Miscarriage     Five times    Past Surgical History  Procedure Laterality Date  . Bunionectomy    . Tonsillectomy    . Tricuspid valve replacement  03/2014    [place], [place]  . Tee without cardioversion N/A 04/14/2015    Procedure: TRANSESOPHAGEAL ECHOCARDIOGRAM (TEE);  Surgeon: Lelon Perla, MD;  Location: [facility] ENDOSCOPY;  Service: Cardiovascular;  Laterality: N/A;    Social History:  reports that she has been smoking Cigarettes.  She has never used smokeless tobacco. She reports that she uses illicit drugs (IV). She reports that she does not drink alcohol.   Family History  Problem Relation Age of Onset  . Hypertension Father   . Kidney failure Father   . Hyperlipidemia Mother   . CAD Mother   . Deafness Mother     No Known  Allergies   Medications: I have reviewed patients current medications as documented in Epic Anti-infectives    Start     Dose/Rate Route Frequency Ordered Stop   09/12/15 1100  ceFAZolin (ANCEF) IVPB 2g/100 mL premix     2 g 200 mL/hr over 30 Minutes Intravenous Every 8 hours 09/12/15 0957     09/11/15 0900  vancomycin (VANCOCIN) IVPB 1000 mg/200 mL premix     1,000 mg 200 mL/hr over 60 Minutes Intravenous Every 8 hours 09/11/15 0644     09/11/15 0645   piperacillin-tazobactam (ZOSYN) IVPB 3.375 g  Status:  Discontinued     3.375 g 12.5 mL/hr over 240 Minutes Intravenous 3 times per day 09/11/15 0644 09/12/15 0946   09/11/15 0100  ceFEPIme (MAXIPIME) 2 g in dextrose 5 % 50 mL IVPB     2 g 100 mL/hr over 30 Minutes Intravenous  Once 09/11/15 0056 09/11/15 0352   09/11/15 0045  vancomycin (VANCOCIN) IVPB 1000 mg/200 mL premix     1,000 mg 200 mL/hr over 60 Minutes Intravenous  Once 09/11/15 0040 09/11/15 0207         ROS: as in HPI otherwise remainder of 12 point Review of Systems is difficult to obtain due to patients somnolence    Blood pressure 117/104, pulse 66, temperature 98.5 F (36.9 C), temperature source Oral, resp. rate 20, height 5' 4"  (3.536 m), weight 164 lb 0.4 oz (74.4 kg), last menstrual period 04/12/2015, SpO2 92 %. General: Sleepy but arousable and can answer some questions before falling asleep again  HEENT: anicteric sclera,  EOMI, oropharynx clear and without exudate Cardiovascular: regular rate, normal r,  i could not hear murmur with contact precaution stethoscope Pulmonary: few rhonchi Gastrointestinal: soft nontender, nondistended, normal bowel sounds, Musculoskeletal: no  clubbing or edema noted bilaterally Skin, soft tissue: Multiple areas where she has injected intravenous drugs no obvious abscess Neuro: nonfocal, strength and sensation intact   Results for orders placed or performed during the hospital encounter of 09/10/15 (from the past 48 hour(s))  Basic metabolic panel     Status: Abnormal   Collection Time: 09/10/15 10:20 PM  Result Value Ref Range   Sodium 139 135 - 145 mmol/L   Potassium 3.2 (L) 3.5 - 5.1 mmol/L   Chloride 113 (H) 101 - 111 mmol/L   CO2 16 (L) 22 - 32 mmol/L   Glucose, Bld 128 (H) 65 - 99 mg/dL   BUN 27 (H) 6 - 20 mg/dL   Creatinine, Ser 0.79 0.44 - 1.00 mg/dL   Calcium 8.0 (L) 8.9 - 10.3 mg/dL   GFR calc non Af Amer >60 >60 mL/min   GFR calc Af Amer >60 >60 mL/min     Comment: (NOTE) The eGFR has been calculated using the CKD EPI equation. This calculation has not been validated in all clinical situations. eGFR's persistently <60 mL/min signify possible Chronic Kidney Disease.    Anion gap 10 5 - 15  CBC     Status: Abnormal   Collection Time: 09/10/15 10:20 PM  Result Value Ref Range   WBC 6.0 4.0 - 10.5 K/uL   RBC 4.67 3.87 - 5.11 MIL/uL   Hemoglobin 10.8 (L) 12.0 - 15.0 g/dL   HCT 34.6 (L) 36.0 - 46.0 %   MCV 74.1 (L) 78.0 - 100.0 fL   MCH 23.1 (L) 26.0 - 34.0 pg   MCHC 31.2 30.0 - 36.0 g/dL   RDW 17.7 (H) 11.5 - 15.5 %   Platelets 44 (  L) 150 - 400 K/uL    Comment: SPECIMEN CHECKED FOR CLOTS PLATELET COUNT CONFIRMED BY SMEAR   Protime-INR - (order if Patient is taking Coumadin / Warfarin)     Status: Abnormal   Collection Time: 09/10/15 10:20 PM  Result Value Ref Range   Prothrombin Time 15.9 (H) 11.6 - 15.2 seconds   INR 1.26 0.00 - 1.49  Troponin I     Status: Abnormal   Collection Time: 09/10/15 10:20 PM  Result Value Ref Range   Troponin I 0.19 (H) <0.031 ng/mL    Comment:        PERSISTENTLY INCREASED TROPONIN VALUES IN THE RANGE OF 0.04-0.49 ng/mL CAN BE SEEN IN:       -UNSTABLE ANGINA       -CONGESTIVE HEART FAILURE       -MYOCARDITIS       -CHEST TRAUMA       -ARRYHTHMIAS       -LATE PRESENTING MYOCARDIAL INFARCTION       -COPD   CLINICAL FOLLOW-UP RECOMMENDED.   hCG, quantitative, pregnancy     Status: Abnormal   Collection Time: 09/10/15 10:20 PM  Result Value Ref Range   hCG, Beta Chain, Quant, S 7 (H) <5 mIU/mL    Comment:          GEST. AGE      CONC.  (mIU/mL)   <=1 WEEK        5 - 50     2 WEEKS       50 - 500     3 WEEKS       100 - 10,000     4 WEEKS     1,000 - 30,000     5 WEEKS     3,500 - 115,000   6-8 WEEKS     12,000 - 270,000    12 WEEKS     15,000 - 220,000        FEMALE AND NON-PREGNANT FEMALE:     LESS THAN 5 mIU/mL   Urinalysis, Routine w reflex microscopic (not at [facility])     Status: Abnormal    Collection Time: 09/10/15 10:21 PM  Result Value Ref Range   Color, Urine ORANGE (A) YELLOW    Comment: BIOCHEMICALS MAY BE AFFECTED BY COLOR   APPearance CLEAR CLEAR   Specific Gravity, Urine 1.028 1.005 - 1.030   pH 6.5 5.0 - 8.0   Glucose, UA NEGATIVE NEGATIVE mg/dL   Hgb urine dipstick NEGATIVE NEGATIVE   Bilirubin Urine LARGE (A) NEGATIVE   Ketones, ur 15 (A) NEGATIVE mg/dL   Protein, ur 100 (A) NEGATIVE mg/dL   Nitrite POSITIVE (A) NEGATIVE   Leukocytes, UA MODERATE (A) NEGATIVE  Pregnancy, urine     Status: None   Collection Time: 09/10/15 10:21 PM  Result Value Ref Range   Preg Test, Ur NEGATIVE NEGATIVE    Comment:        THE SENSITIVITY OF THIS METHODOLOGY IS >20 mIU/mL.   Urine rapid drug screen (hosp performed)     Status: Abnormal   Collection Time: 09/10/15 10:21 PM  Result Value Ref Range   Opiates POSITIVE (A) NONE DETECTED   Cocaine NONE DETECTED NONE DETECTED   Benzodiazepines POSITIVE (A) NONE DETECTED   Amphetamines POSITIVE (A) NONE DETECTED   Tetrahydrocannabinol NONE DETECTED NONE DETECTED   Barbiturates NONE DETECTED NONE DETECTED    Comment:        DRUG SCREEN FOR MEDICAL PURPOSES ONLY.  IF  CONFIRMATION IS NEEDED FOR ANY PURPOSE, NOTIFY LAB WITHIN 5 DAYS.        LOWEST DETECTABLE LIMITS FOR URINE DRUG SCREEN Drug Class       Cutoff (ng/mL) Amphetamine      1000 Barbiturate      200 Benzodiazepine   992 Tricyclics       426 Opiates          300 Cocaine          300 THC              50   Urine microscopic-add on     Status: Abnormal   Collection Time: 09/10/15 10:21 PM  Result Value Ref Range   Squamous Epithelial / LPF 0-5 (A) NONE SEEN   WBC, UA 6-30 0 - 5 WBC/hpf   RBC / HPF 0-5 0 - 5 RBC/hpf   Bacteria, UA RARE (A) NONE SEEN   Casts HYALINE CASTS (A) NEGATIVE  I-Stat CG4 Lactic Acid, ED     Status: Abnormal   Collection Time: 09/10/15 10:39 PM  Result Value Ref Range   Lactic Acid, Venous 2.46 (HH) 0.5 - 2.0 mmol/L   Comment  NOTIFIED PHYSICIAN   I-Stat Beta hCG blood, ED ([facility], [facility], [facility] only)     Status: Abnormal   Collection Time: 09/11/15 12:15 AM  Result Value Ref Range   I-stat hCG, quantitative 16.6 (H) <5 mIU/mL   Comment 3            Comment:   GEST. AGE      CONC.  (mIU/mL)   <=1 WEEK        5 - 50     2 WEEKS       50 - 500     3 WEEKS       100 - 10,000     4 WEEKS     1,000 - 30,000        FEMALE AND NON-PREGNANT FEMALE:     LESS THAN 5 mIU/mL   Culture, blood (single) w Reflex to ID Panel     Status: None (Preliminary result)   Collection Time: 09/11/15 12:40 AM  Result Value Ref Range   Specimen Description BLOOD RIGHT ARM    Special Requests IN PEDIATRIC BOTTLE 3ML    Culture  Setup Time      GRAM POSITIVE COCCI IN CLUSTERS PEDIATRIC BOTTLE CALLED TO FLYNT,F RN 09/11/15 2046 [place] CONFIRMED BY V WILKINS    Culture      GRAM POSITIVE COCCI CULTURE REINCUBATED FOR BETTER GROWTH    Report Status PENDING   I-Stat CG4 Lactic Acid, ED     Status: Abnormal   Collection Time: 09/11/15  1:27 AM  Result Value Ref Range   Lactic Acid, Venous 3.63 (HH) 0.5 - 2.0 mmol/L   Comment NOTIFIED PHYSICIAN   Lactic acid, plasma     Status: Abnormal   Collection Time: 09/11/15  6:25 AM  Result Value Ref Range   Lactic Acid, Venous 2.1 (HH) 0.5 - 2.0 mmol/L    Comment: CRITICAL RESULT CALLED TO, READ BACK BY AND VERIFIED WITH: THOMPSON A,RN 09/11/15 0713 WAYK   Procalcitonin     Status: None   Collection Time: 09/11/15  6:26 AM  Result Value Ref Range   Procalcitonin 3.09 ng/mL    Comment:        Interpretation: PCT > 2 ng/mL: Systemic infection (sepsis) is likely, unless other causes are known. (NOTE)  ICU PCT Algorithm               Non ICU PCT Algorithm    ----------------------------     ------------------------------         PCT < 0.25 ng/mL                 PCT < 0.1 ng/mL     Stopping of antibiotics            Stopping of antibiotics       strongly encouraged.                strongly encouraged.    ----------------------------     ------------------------------       PCT level decrease by               PCT < 0.25 ng/mL       >= 80% from peak PCT       OR PCT 0.25 - 0.5 ng/mL          Stopping of antibiotics                                             encouraged.     Stopping of antibiotics           encouraged.    ----------------------------     ------------------------------       PCT level decrease by              PCT >= 0.25 ng/mL       < 80% from peak PCT        AND PCT >= 0.5 ng/mL            Continuing antibiotics                                               encouraged.       Continuing antibiotics            encouraged.    ----------------------------     ------------------------------     PCT level increase compared          PCT > 0.5 ng/mL         with peak PCT AND          PCT >= 0.5 ng/mL             Escalation of antibiotics                                          strongly encouraged.      Escalation of antibiotics        strongly encouraged.   Magnesium     Status: Abnormal   Collection Time: 09/11/15  6:26 AM  Result Value Ref Range   Magnesium 1.5 (L) 1.7 - 2.4 mg/dL  Calcium     Status: Abnormal   Collection Time: 09/11/15  6:26 AM  Result Value Ref Range   Calcium 7.4 (L) 8.9 - 10.3 mg/dL  Troponin I     Status: None   Collection Time: 09/11/15  6:26 AM  Result Value Ref Range   Troponin I 0.03 <  0.031 ng/mL    Comment:        NO INDICATION OF MYOCARDIAL INJURY.   Lactic acid, plasma     Status: None   Collection Time: 09/11/15  9:48 AM  Result Value Ref Range   Lactic Acid, Venous 2.0 0.5 - 2.0 mmol/L  Troponin I     Status: None   Collection Time: 09/11/15 11:29 AM  Result Value Ref Range   Troponin I 0.03 <0.031 ng/mL    Comment:        NO INDICATION OF MYOCARDIAL INJURY.   I-Stat arterial blood gas, ED     Status: Abnormal   Collection Time: 09/11/15  3:40 PM  Result Value Ref Range   pH, Arterial 7.470 (H)  7.350 - 7.450   pCO2 arterial 20.1 (L) 35.0 - 45.0 mmHg   pO2, Arterial 51.0 (L) 80.0 - 100.0 mmHg   Bicarbonate 14.6 (L) 20.0 - 24.0 mEq/L   TCO2 15 0 - 100 mmol/L   O2 Saturation 89.0 %   Acid-base deficit 7.0 (H) 0.0 - 2.0 mmol/L   Patient temperature 99.1 F    Collection site RADIAL, ALLEN'S TEST ACCEPTABLE    Drawn by RT    Sample type ARTERIAL    Comment NOTIFIED PHYSICIAN   CBG monitoring, ED     Status: None   Collection Time: 09/11/15  3:44 PM  Result Value Ref Range   Glucose-Capillary 99 65 - 99 mg/dL  Troponin I     Status: Abnormal   Collection Time: 09/11/15  5:15 PM  Result Value Ref Range   Troponin I 0.14 (H) <0.031 ng/mL    Comment:        PERSISTENTLY INCREASED TROPONIN VALUES IN THE RANGE OF 0.04-0.49 ng/mL CAN BE SEEN IN:       -UNSTABLE ANGINA       -CONGESTIVE HEART FAILURE       -MYOCARDITIS       -CHEST TRAUMA       -ARRYHTHMIAS       -LATE PRESENTING MYOCARDIAL INFARCTION       -COPD   CLINICAL FOLLOW-UP RECOMMENDED.   CBC with Differential/Platelet     Status: Abnormal   Collection Time: 09/11/15  5:15 PM  Result Value Ref Range   WBC 10.1 4.0 - 10.5 K/uL   RBC 4.36 3.87 - 5.11 MIL/uL   Hemoglobin 10.4 (L) 12.0 - 15.0 g/dL   HCT 32.0 (L) 36.0 - 46.0 %   MCV 73.4 (L) 78.0 - 100.0 fL   MCH 23.9 (L) 26.0 - 34.0 pg   MCHC 32.5 30.0 - 36.0 g/dL   RDW 17.8 (H) 11.5 - 15.5 %   Platelets 53 (L) 150 - 400 K/uL    Comment: CONSISTENT WITH PREVIOUS RESULT   Neutrophils Relative % 76 %   Lymphocytes Relative 11 %   Monocytes Relative 13 %   Eosinophils Relative 0 %   Basophils Relative 0 %   Neutro Abs 7.7 1.7 - 7.7 K/uL   Lymphs Abs 1.1 0.7 - 4.0 K/uL   Monocytes Absolute 1.3 (H) 0.1 - 1.0 K/uL   Eosinophils Absolute 0.0 0.0 - 0.7 K/uL   Basophils Absolute 0.0 0.0 - 0.1 K/uL   WBC Morphology ATYPICAL LYMPHOCYTES   Comprehensive metabolic panel     Status: Abnormal   Collection Time: 09/11/15  5:15 PM  Result Value Ref Range   Sodium 140 135  - 145 mmol/L   Potassium 3.2 (L) 3.5 - 5.1  mmol/L   Chloride 114 (H) 101 - 111 mmol/L   CO2 15 (L) 22 - 32 mmol/L   Glucose, Bld 106 (H) 65 - 99 mg/dL   BUN 19 6 - 20 mg/dL   Creatinine, Ser 0.97 0.44 - 1.00 mg/dL   Calcium 7.6 (L) 8.9 - 10.3 mg/dL   Total Protein 6.1 (L) 6.5 - 8.1 g/dL   Albumin 2.2 (L) 3.5 - 5.0 g/dL   AST 21 15 - 41 U/L   ALT 17 14 - 54 U/L   Alkaline Phosphatase 72 38 - 126 U/L   Total Bilirubin 0.9 0.3 - 1.2 mg/dL   GFR calc non Af Amer >60 >60 mL/min   GFR calc Af Amer >60 >60 mL/min    Comment: (NOTE) The eGFR has been calculated using the CKD EPI equation. This calculation has not been validated in all clinical situations. eGFR's persistently <60 mL/min signify possible Chronic Kidney Disease.    Anion gap 11 5 - 15  Volatiles,Blood (acetone,ethanol,isoprop,methanol)     Status: None   Collection Time: 09/11/15  5:15 PM  Result Value Ref Range   Acetone, blood None Detected 0.000 - 0.010 %    Comment: (NOTE) RESULTS CALLED TO RIA WILSON ON 09-11-2015 AT 21:15. TESTING PERFORMED BY NANCY DENNY.                                Detection Limit = 0.010 This test was developed and its performance characteristics determined by [facility]. It has not been cleared or approved by the Food and Drug Administration.    Ethanol, blood None Detected 0.000 - 0.010 %    Comment:                                 Detection Limit = 0.010   Isopropanol, blood None Detected 0.000 - 0.010 %    Comment:                                 Detection Limit = 0.010   Methanol, blood None Detected 0.000 - 0.010 %    Comment: (NOTE)                                Detection Limit = 0.010 Performed At: [facility] [place], [place] [id number] Lindon Romp MD EX:[id number]   Lactic acid, plasma     Status: None   Collection Time: 09/11/15  5:15 PM  Result Value Ref Range   Lactic Acid, Venous 1.3 0.5 - 2.0 mmol/L  I-Stat CG4 Lactic Acid, ED     Status:  None   Collection Time: 09/11/15  5:30 PM  Result Value Ref Range   Lactic Acid, Venous 1.22 0.5 - 2.0 mmol/L  I-Stat arterial blood gas, ED     Status: Abnormal   Collection Time: 09/11/15  5:45 PM  Result Value Ref Range   pH, Arterial 7.451 (H) 7.350 - 7.450   pCO2 arterial 23.4 (L) 35.0 - 45.0 mmHg   pO2, Arterial 58.0 (L) 80.0 - 100.0 mmHg   Bicarbonate 16.2 (L) 20.0 - 24.0 mEq/L   TCO2 17 0 - 100 mmol/L   O2 Saturation 91.0 %   Acid-base deficit 6.0 (H)  0.0 - 2.0 mmol/L   Patient temperature 99.1 F    Sample type ARTERIAL   MRSA PCR Screening     Status: Abnormal   Collection Time: 09/11/15  6:54 PM  Result Value Ref Range   MRSA by PCR POSITIVE (A) NEGATIVE    Comment:        The GeneXpert MRSA Assay (FDA approved for NASAL specimens only), is one component of a comprehensive MRSA colonization surveillance program. It is not intended to diagnose MRSA infection nor to guide or monitor treatment for MRSA infections. RESULT CALLED TO, READ BACK BY AND VERIFIED WITH: T.CRITE,RN 09/11/15 @2042  BY V.WILKINS   Glucose, capillary     Status: Abnormal   Collection Time: 09/11/15  7:43 PM  Result Value Ref Range   Glucose-Capillary 127 (H) 65 - 99 mg/dL   Comment 1 Notify RN   Lactic acid, plasma     Status: Abnormal   Collection Time: 09/11/15  9:06 PM  Result Value Ref Range   Lactic Acid, Venous 2.1 (HH) 0.5 - 2.0 mmol/L    Comment: CRITICAL RESULT CALLED TO, READ BACK BY AND VERIFIED WITH: CROSS M,RN 09/11/15 2209 WAYK   Cortisol     Status: None   Collection Time: 09/11/15  9:06 PM  Result Value Ref Range   Cortisol, Plasma 55.8 ug/dL    Comment: RESULTS CONFIRMED BY MANUAL DILUTION (NOTE) AM    6.7 - 22.6 ug/dL PM   <10.0       ug/dL   APTT     Status: Abnormal   Collection Time: 09/11/15  9:06 PM  Result Value Ref Range   aPTT >200 (HH) 24 - 37 seconds    Comment:        IF BASELINE aPTT IS ELEVATED, SUGGEST PATIENT RISK ASSESSMENT BE USED TO DETERMINE  APPROPRIATE ANTICOAGULANT THERAPY. REPEATED TO VERIFY CRITICAL RESULT CALLED TO, READ BACK BY AND VERIFIED WITH: FLYNT [facility] 09/11/15 2158 WAYK   Glucose, capillary     Status: Abnormal   Collection Time: 09/11/15 11:59 PM  Result Value Ref Range   Glucose-Capillary 194 (H) 65 - 99 mg/dL   Comment 1 Notify RN   APTT     Status: Abnormal   Collection Time: 09/12/15  1:30 AM  Result Value Ref Range   aPTT >200 (HH) 24 - 37 seconds    Comment:        IF BASELINE aPTT IS ELEVATED, SUGGEST PATIENT RISK ASSESSMENT BE USED TO DETERMINE APPROPRIATE ANTICOAGULANT THERAPY. REPEATED TO VERIFY CRITICAL RESULT CALLED TO, READ BACK BY AND VERIFIED WITH: F.FLINT,RN 0234 09/12/15 M.CAMPBELL   Basic metabolic panel     Status: Abnormal   Collection Time: 09/12/15  1:30 AM  Result Value Ref Range   Sodium 139 135 - 145 mmol/L   Potassium 3.2 (L) 3.5 - 5.1 mmol/L   Chloride 110 101 - 111 mmol/L   CO2 18 (L) 22 - 32 mmol/L   Glucose, Bld 157 (H) 65 - 99 mg/dL   BUN 18 6 - 20 mg/dL   Creatinine, Ser 0.90 0.44 - 1.00 mg/dL   Calcium 7.6 (L) 8.9 - 10.3 mg/dL   GFR calc non Af Amer >60 >60 mL/min   GFR calc Af Amer >60 >60 mL/min    Comment: (NOTE) The eGFR has been calculated using the CKD EPI equation. This calculation has not been validated in all clinical situations. eGFR's persistently <60 mL/min signify possible Chronic Kidney Disease.    Anion gap  11 5 - 15  CBC     Status: Abnormal   Collection Time: 09/12/15  1:30 AM  Result Value Ref Range   WBC 6.5 4.0 - 10.5 K/uL   RBC 4.11 3.87 - 5.11 MIL/uL   Hemoglobin 9.4 (L) 12.0 - 15.0 g/dL   HCT 30.1 (L) 36.0 - 46.0 %   MCV 73.2 (L) 78.0 - 100.0 fL   MCH 22.9 (L) 26.0 - 34.0 pg   MCHC 31.2 30.0 - 36.0 g/dL   RDW 17.5 (H) 11.5 - 15.5 %   Platelets 40 (L) 150 - 400 K/uL    Comment: CONSISTENT WITH PREVIOUS RESULT  Magnesium     Status: None   Collection Time: 09/12/15  1:30 AM  Result Value Ref Range   Magnesium 1.8 1.7 - 2.4 mg/dL   Phosphorus     Status: None   Collection Time: 09/12/15  1:30 AM  Result Value Ref Range   Phosphorus 3.0 2.5 - 4.6 mg/dL  Protime-INR     Status: Abnormal   Collection Time: 09/12/15  1:30 AM  Result Value Ref Range   Prothrombin Time 24.7 (H) 11.6 - 15.2 seconds   INR 2.26 (H) 0.00 - 1.49  I-STAT 3, arterial blood gas (G3+)     Status: Abnormal   Collection Time: 09/12/15  2:49 AM  Result Value Ref Range   pH, Arterial 7.464 (H) 7.350 - 7.450   pCO2 arterial 25.2 (L) 35.0 - 45.0 mmHg   pO2, Arterial 56.0 (L) 80.0 - 100.0 mmHg   Bicarbonate 18.1 (L) 20.0 - 24.0 mEq/L   TCO2 19 0 - 100 mmol/L   O2 Saturation 91.0 %   Acid-base deficit 5.0 (H) 0.0 - 2.0 mmol/L   Patient temperature 98.3 F    Collection site RADIAL, ALLEN'S TEST ACCEPTABLE    Drawn by RT    Sample type ARTERIAL   Glucose, capillary     Status: Abnormal   Collection Time: 09/12/15  4:05 AM  Result Value Ref Range   Glucose-Capillary 143 (H) 65 - 99 mg/dL   Comment 1 Notify RN    Comment 2 Call MD NNP PA CNM   APTT     Status: Abnormal   Collection Time: 09/12/15  8:23 AM  Result Value Ref Range   aPTT >200 (HH) 24 - 37 seconds    Comment:        IF BASELINE aPTT IS ELEVATED, SUGGEST PATIENT RISK ASSESSMENT BE USED TO DETERMINE APPROPRIATE ANTICOAGULANT THERAPY. REPEATED TO VERIFY CRITICAL RESULT CALLED TO, READ BACK BY AND VERIFIED WITH: DOROTHU [facility] RN AT [id number] 09/12/15 BY Karie Chimera   APTT     Status: Abnormal   Collection Time: 09/12/15 11:17 AM  Result Value Ref Range   aPTT 133 (H) 24 - 37 seconds    Comment:        IF BASELINE aPTT IS ELEVATED, SUGGEST PATIENT RISK ASSESSMENT BE USED TO DETERMINE APPROPRIATE ANTICOAGULANT THERAPY.    @BRIEFLABTABLE (sdes,specrequest,cult,reptstatus)   ) Recent Results (from the past 720 hour(s))  Culture, blood (single) w Reflex to ID Panel     Status: None (Preliminary result)   Collection Time: 09/11/15 12:40 AM  Result Value Ref Range Status    Specimen Description BLOOD RIGHT ARM  Final   Special Requests IN PEDIATRIC BOTTLE 3ML  Final   Culture  Setup Time   Final    GRAM POSITIVE COCCI IN CLUSTERS PEDIATRIC BOTTLE CALLED TO FLYNT,F RN 09/11/15 2046 [place],K CONFIRMED BY  Raynelle Fanning    Culture   Final    GRAM POSITIVE COCCI CULTURE REINCUBATED FOR BETTER GROWTH    Report Status PENDING  Incomplete  MRSA PCR Screening     Status: Abnormal   Collection Time: 09/11/15  6:54 PM  Result Value Ref Range Status   MRSA by PCR POSITIVE (A) NEGATIVE Final    Comment:        The GeneXpert MRSA Assay (FDA approved for NASAL specimens only), is one component of a comprehensive MRSA colonization surveillance program. It is not intended to diagnose MRSA infection nor to guide or monitor treatment for MRSA infections. RESULT CALLED TO, READ BACK BY AND VERIFIED WITH: T.CRITE,RN 09/11/15 @2042  BY V.WILKINS      Impression/Recommendation  Active Problems:   IV drug abuse   Anemia   Thrombocytopenia, unspecified (HCC)   Hepatitis C   Hypokalemia   Septic embolism (HCC)   Endocarditis   Prolonged Q-T interval on ECG   Acute endocarditis   Narcotic dependence [facility])   Palliative care encounter   Melanie Crane is a 40 y.o. female with hx of TVR in [place] and then at least 3 and now 4 episodes or recurrent prosthetic valve endocarditis in the context of ongoing IV drug use. Her transthoracic echocardiogram done here at [facility] suggest that she may potentially have a mitral valve vegetation as well.  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:  I would like to confirm whether this is MRSA or MSSA  In the interim and today vancomycin and cefazolin and added rifampin I plan on adding gentamicin as well but would like clarity on MRSA vs MSSA to minimize nephrotoxicity  She will need TEE  She needs a palliative care consult re her goals of care as well as  psychiatry consult  She should be seen by cardiology during her inpatient stay and we should also touch with cardiothoracic surgery once we have obtained the data from the transesophageal echocardiogram I doubt that any surgery will be entertained in this patient with ongoing IV drug use.  Her actions continue to show that she seems incapable of detoxing. Her recent leaving [facility] in [place] also does not bode well for her.  Recheck HIV   09/12/2015, 1:57 PM   Thank you so much for this interesting consult  [place] for [place] [phone] (pager) [phone] (office) 09/12/2015, 1:57 PM  [place] 09/12/2015, 1:57 PM

## 2015-09-12 NOTE — Progress Notes (Signed)
PULMONARY / CRITICAL CARE MEDICINE   Name: Melanie Crane MRN: [id number] DOB: [birth year]    ADMISSION DATE:  09/10/2015 CONSULTATION DATE:  09/11/2015  REFERRING MD:  TRH  CHIEF COMPLAINT:  Respiratory distress and chest pain.  HISTORY OF PRESENT ILLNESS:   40 year old female with 3 previous episodes of endocarditis that required open heart surgery who presents today with CP and SOB.  CT of the chest indicated multiple areas with cavitations consistent with septic emboli.  Patient reports continuing to use narcotic medication (opana) IV inspite of multiple previous episodes of endocarditis.    SUBJECTIVE:  In pain, asking for narcotics. But sedated   VITAL SIGNS: BP 107/87 mmHg  Pulse 66  Temp(Src) 98.8 F (37.1 C) (Oral)  Resp 17  Ht  (1.626 m)  Wt 164 lb 0.4 oz (74.4 kg)  BMI 28.14 kg/m2  SpO2 92%  LMP 04/12/2015  HEMODYNAMICS:    VENTILATOR SETTINGS: Vent Mode:  [-] BIPAP;PCV FiO2 (%):  [100 %] 100 % Set Rate:  [15 bmp] 15 bmp PEEP:  [5 cmH20] 5 cmH20 Pressure Support:  [10 cmH20] 10 cmH20 Plateau Pressure:  [16 cmH20] 16 cmH20  INTAKE / OUTPUT: I/O last 3 completed shifts: In: 4558.3 [P.O.:240; I.V.:1168.3; IV Piggyback:3150] Out: 4000 [Urine:4000]  PHYSICAL EXAMINATION: General:  Chronically ill appearing female who is much older than stated age. Neuro:  Alert and oriented x3, moving all ext to command. HEENT:  [place]/AT, PERRL, EOM-I and MMM. Cardiovascular:  RRR, Nl S1/S2, -M/R/G. Lungs:  Bibasilar crackles. No accessory muscle use  Abdomen:  Soft, NT, ND and +BS. Musculoskeletal:  -edema and -tenderness. Skin:  Intact.  LABS:  BMET  Recent Labs Lab 09/10/15 2220 09/11/15 1715 09/12/15 0130  NA 139 140 139  K 3.2* 3.2* 3.2*  CL 113* 114* 110  CO2 16* 15* 18*  BUN 27* 19 18  CREATININE 0.79 0.97 0.90  GLUCOSE 128* 106* 157*    Electrolytes  Recent Labs Lab 09/11/15 0626 09/11/15 1715 09/12/15 0130  CALCIUM 7.4* 7.6* 7.6*   MG 1.5*  --  1.8  PHOS  --   --  3.0    CBC  Recent Labs Lab 09/10/15 2220 09/11/15 1715 09/12/15 0130  WBC 6.0 10.1 6.5  HGB 10.8* 10.4* 9.4*  HCT 34.6* 32.0* 30.1*  PLT 44* 53* 40*    Coag's  Recent Labs Lab 09/10/15 2220 09/11/15 2106 09/12/15 0130 09/12/15 0823  APTT  --  >200* >200* >200*  INR 1.26  --  2.26*  --     Sepsis Markers  Recent Labs Lab 09/11/15 0626  09/11/15 1715 09/11/15 1730 09/11/15 2106  LATICACIDVEN  --   < > 1.3 1.22 2.1*  PROCALCITON 3.09  --   --   --   --   < > = values in this interval not displayed.  ABG  Recent Labs Lab 09/11/15 1540 09/11/15 1745 09/12/15 0249  PHART 7.470* 7.451* 7.464*  PCO2ART 20.1* 23.4* 25.2*  PO2ART 51.0* 58.0* 56.0*    Liver Enzymes  Recent Labs Lab 09/11/15 1715  AST 21  ALT 17  ALKPHOS 72  BILITOT 0.9  ALBUMIN 2.2*    Cardiac Enzymes  Recent Labs Lab 09/11/15 0626 09/11/15 1129 09/11/15 1715  TROPONINI 0.03 0.03 0.14*    Glucose  Recent Labs Lab 09/11/15 1544 09/11/15 1943 09/11/15 2359 09/12/15 0405  GLUCAP 99 127* 194* 143*    Imaging Dg Chest 2 View  09/12/2015  CLINICAL DATA:  Respiratory failure. EXAM: CHEST  2 VIEW COMPARISON:  September 11, 2015. FINDINGS: Stable cardiomegaly. No pneumothorax is noted. Mild bibasilar opacities are noted concerning for subsegmental atelectasis or possibly inflammation. Bony thorax is unremarkable. IMPRESSION: Mild bibasilar opacities concerning for subsegmental atelectasis or possibly inflammation. Electronically Signed   By: Lupita RaiderJames  Green Jr, M.D.   On: 09/12/2015 09:11   Dg Chest Portable 1 View  09/11/2015  CLINICAL DATA:  Sepsis, endocarditis, fever EXAM: PORTABLE CHEST 1 VIEW COMPARISON:  Portable exam 1548 hours compared to 09/10/2015 and correlated with chest CT of 09/11/2015 FINDINGS: Normal size of cardiac silhouette post TVR. Mediastinal contours and pulmonary vascularity normal. Lungs clear. No pleural effusion or  pneumothorax. Bones unremarkable. IMPRESSION: No acute abnormalities. Electronically Signed   By: Ulyses SouthwardMark  Boles M.D.   On: 09/11/2015 15:58   I reviewed chest CT myself, no PE, septic emboli with cavitations.  STUDIES:  CT of the chest as above.  CULTURES: Blood 4/1>>>GPC in blood>>> Urine 4/1>>> Sputum 4/1>>>  ANTIBIOTICS: Vancomycin 4/1>>> Zosyn 4/1>>>  SIGNIFICANT EVENTS: 4/1 admit to the hospital for likely endocarditis.  LINES/TUBES: PIV  DISCUSSION: [age] year female with multiple episodes of endocarditis requiring tricuspid valve replacement in the past who continues to use IV opana presenting with endocarditis and chest pain.  PCCM was consulted for hypoxemic respiratory failure.  CT without PE. Now on [place] 2 liters. Can go to ICU.   ASSESSMENT / PLAN:  PULMONARY A: Acute hypoxemic respiratory failure due to septic emboli.  P:   - wean )2 - Minimize sedation as able. - Make as dry as able. - CXR in AM.  CARDIOVASCULAR A:  Mild sinus tachycardia. P:  - IVF as needed. - Monitor for withdrawal.  RENAL A:   Hypokalemia P:   - Replace K - BMET in AM - Replace electrolytes as indicated. - KVO IVF.  GASTROINTESTINAL A:   No active issues. P:   - Heart healthy diet.  HEMATOLOGIC A:   No active issues. P:  - CBC in AM. - Transfuse per ICU protocol.  INFECTIOUS A:   Fourth round of tricuspid valve endocarditis. P:   - ID consult called to see on 4/2. - Pan culture-->pending - Vanc/zosyn. - Stress dose steroids-->wean to off   ENDOCRINE A:   No active issues.   P:   - Monitor.  NEUROLOGIC A:   Narcotic addiction. P:   - rx per medicine/ palliative  - Monitor closely for evidence of withdrawal. - May require more narcotics and may need to start methadone.  FAMILY  - Updates: Spoke with patient, wishes full code status.  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  The patient was unusually hypoxemic yesterday but now improved.  On exam, bibasilar rales noted.  I reviewed CXR myself, bibasilar opacities.  Discussed with TRH-MD and PCCM-NP.  Septic emboli: due to endocarditis.  - Abx as ordered.  - F/U imaging given necrotization.  Hypoxemia:  - Titrate O2 for sat of 88-92%.  - Will need an ambulatory desaturation prior to discharge for ?of home O2.  Tricuspid valve endocarditis:  - Abx as ordered.  - May need CVTS involved.  - Appreciate input from ID.  IVDA:  - Will need serious rehab issues, 4th bout of endocarditis.  Transfer  to SDU and keep on [facility] service with PCCM off, please call back if needed.  Patient seen and examined, agree with above note.  I dictated the care and orders written for this patient under my direction.  Alyson Reedy, MD [phone]  09/12/2015, 11:32 AM

## 2015-09-12 NOTE — Progress Notes (Signed)
CRITICAL VALUE ALERT  Critical value received:  APTT >200  Date of notification:   09/12/15  Time of notification:  0235  Critical value read back: yes  Nurse who received alert:  Julius BowelsFelicia Maisen Klingler, RN  Pharmacy notified, new argatroban orders given

## 2015-09-12 NOTE — Progress Notes (Addendum)
CRITICAL VALUE ALERT  Critical value received: APTT >200  Date of notification:  09/11/15  Time of notification:  2200  Critical value read back: Yes  Nurse who received alert:  Julius BowelsFelicia Quoc Tome, RN  Pharmacy aware argatroban dosage adjust

## 2015-09-12 NOTE — Progress Notes (Signed)
Pharmacy Antibiotic Note  Melanie CivatteJoann Crane Melanie Crane is a 40 y.o. female admitted on 09/10/2015 with sepsis likely secondary to endocarditis. Changing zosyn to cefazolin per ID recommendations. Pt is afebrile and WBC is WNL.SCr remains WNL.   Plan: - Continue vanc 1gm IV Q8H - consider trough tomorrow - Cefazolin 2gm IV Q8H - F/u renal fxn, C&S, clinical status and trough at SS  Height: 5\' 4"  (162.6 cm) Weight: 164 lb 0.4 oz (74.4 kg) IBW/kg (Calculated) : 54.7  Temp (24hrs), Avg:98.4 F (36.9 C), Min:97.4 F (36.3 C), Max:99.1 F (37.3 C)   Recent Labs Lab 09/10/15 2220  09/11/15 0625 09/11/15 0948 09/11/15 1715 09/11/15 1730 09/11/15 2106 09/12/15 0130  WBC 6.0  --   --   --  10.1  --   --  6.5  CREATININE 0.79  --   --   --  0.97  --   --  0.90  LATICACIDVEN  --   < > 2.1* 2.0 1.3 1.22 2.1*  --   < > = values in this interval not displayed.  Estimated Creatinine Clearance: 82.9 mL/min (by C-G formula based on Cr of 0.9).    No Known Allergies  Antimicrobials this admission: Cefazolin 4/2>> Vanc 4/1>> Zosyn 4/1>>4/2 Cefepime 4/1 x 1 dose  Dose adjustments this admission: N/A  Microbiology results: 4/1 Blood - GPC 4/1 MRSA - NEG  Thank you for allowing pharmacy to be a part of this patient's care.  Jordane Hisle, Drake LeachRachel Lynn 09/12/2015 10:02 AM

## 2015-09-12 NOTE — Progress Notes (Signed)
  Echocardiogram 2D Echocardiogram has been performed.  Arvil ChacoFoster, Maja Mccaffery 09/12/2015, 10:13 AM

## 2015-09-12 NOTE — Progress Notes (Signed)
Long discussion with Dr. Candise CheKale, [Oncology] No specific indication re: Long-term Anti-coag as DVT RUE was provoked Usually drvvt, mixing styudies etc are essential to diagnose anti-phospholipid syndrome Will need anti-phospholipid AC testing in 12 weeks Note AC in this specific patient is extremely risky, given poor compliance etc. Etc.  Pleas KochJai Mandela Bello, MD [facility] [phone](P) [phone]

## 2015-09-12 NOTE — Progress Notes (Addendum)
ANTICOAGULATION CONSULT NOTE - Follow Up Consult  Pharmacy Consult for argatroban Indication: septic emboli, r/o PE  No Known Allergies  Patient Measurements: Height: 5\' 4"  (162.6 cm) Weight: 164 lb 0.4 oz (74.4 kg) IBW/kg (Calculated) : 54.7  Vital Signs: Temp: 98.8 F (37.1 C) (04/02 0407) Temp Source: Oral (04/02 0407) BP: 116/96 mmHg (04/02 0800) Pulse Rate: 61 (04/02 0800)  Labs:  Recent Labs  09/10/15 2220 09/11/15 0626 09/11/15 1129 09/11/15 1715 09/11/15 2106 09/12/15 0130 09/12/15 0823  HGB 10.8*  --   --  10.4*  --  9.4*  --   HCT 34.6*  --   --  32.0*  --  30.1*  --   PLT 44*  --   --  53*  --  40*  --   APTT  --   --   --   --  >200* >200* >200*  LABPROT 15.9*  --   --   --   --  24.7*  --   INR 1.26  --   --   --   --  2.26*  --   CREATININE 0.79  --   --  0.97  --  0.90  --   TROPONINI 0.19* 0.03 0.03 0.14*  --   --   --     Estimated Creatinine Clearance: 82.9 mL/min (by C-G formula based on Cr of 0.9).  Assessment: [age] yof continues on argatroban for septic emboli/septic PE. APTT remains very high despite multiple dose reductions. No bleeding noted. CBC is stable and platelets remain low. HIT antibody pending.   Goal of Therapy:  aPTT 50-90 seconds Monitor platelets by anticoagulation protocol: Yes   Plan:  - Hold argatroban - Check an aPTT in 2 hours - F/u HIT antibody  Clydell Alberts, Drake Leachachel Lynn 09/12/2015,9:58 AM  Addendum: APTT remains elevated with argatroban off.  Plan:  - Continue to hold argatroban - Recheck aPTT now  Lysle Pearlachel Lafaye Mcelmurry, PharmD, BCPS Pager # [phone] 09/12/2015 1:34 PM

## 2015-09-12 NOTE — Progress Notes (Addendum)
ANTICOAGULATION CONSULT NOTE - Follow Up Consult  Pharmacy Consult for Heparin  Indication: septic emboli, r/o PE  No Known Allergies  Patient Measurements: Height: 5\' 4"  (162.6 cm) IBW/kg (Calculated) : 54.7  Vital Signs: Temp: 98.3 F (36.8 C) (04/02 0000) Temp Source: Oral (04/02 0000) BP: 135/99 mmHg (04/02 0200) Pulse Rate: 63 (04/02 0200)  Labs:  Recent Labs  09/10/15 2220 09/11/15 0626 09/11/15 1129 09/11/15 1715 09/11/15 2106 09/12/15 0130  HGB 10.8*  --   --  10.4*  --  9.4*  HCT 34.6*  --   --  32.0*  --  30.1*  PLT 44*  --   --  53*  --  40*  APTT  --   --   --   --  >200* >200*  LABPROT 15.9*  --   --   --   --  24.7*  INR 1.26  --   --   --   --  2.26*  CREATININE 0.79  --   --  0.97  --  0.90  TROPONINI 0.19* 0.03 0.03 0.14*  --   --     CrCl cannot be calculated (Unknown ideal weight.).   Assessment: Concern with low platelets, pt on argatroban for septic emboli, aPTT is now SUPRA-therapeutic x 2 (both were >200), verified with RN that this was drawn correctly, no bleeding issues. ?if patient had elevated aPTT at baseline (not drawn).   Goal of Therapy:  aPTT 50-90 seconds Monitor platelets by anticoagulation protocol: Yes   Plan:  -Hold argatroban x 1 hour -Re-start argatroban at 0.1 mcg/kg/min at 0400 -Check aPTT at 0600  Abran DukeLedford, Reilyn Nelson 09/12/2015,2:53 AM

## 2015-09-12 NOTE — Progress Notes (Signed)
Melanie Crane OZH:[id number] DOB: 11/06/1975 DOA: 09/10/2015 PCP: No PCP Per Patient  Brief narrative:  40 y.o. 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   Patient reports that she had just been [place] to a hospital in [place], but left AGAINST MEDICAL ADVICE due to disagreement with her treatment plan  Past medical history-As per Problem list Chart reviewed as below-   Consultants:  ID  PCCM  Procedures:    Antibiotics:  Cefepime  vanc  zosyn   Subjective  See documentation from 09/11/15  Patient doing better Less short of breath Tolerating diet Pain is reasonably controlled on PCA morphine No blurred or double vision No dysuria No overt shortness of breath No chest pain at current time Becomes tearful at relation of her history with IV drug abuse Started doing IV drugs 2015 after her sister died-relates that she used to take pills before this. relates her sister died of endocarditis as well and also had a valve repair States that she buys the Opana off the street    Objective    Interim Hist and :   Telemetry:    Objective: Filed Vitals:   09/12/15 0513 09/12/15 0530 09/12/15 0600 09/12/15 0630  BP:  116/94 142/93 125/111  Pulse:  56 62 64  Temp:      TempSrc:      Resp: 19 20 22 19   Height:      Weight:      SpO2: 94% 91% 91% 95%    Intake/Output Summary (Last 24 hours) at 09/12/15 0713 Last data filed at 09/12/15 [id number]  Gross per 24 hour  Intake 2008.34 ml  Output   4000 ml  Net -1991.66  ml    Exam:  General: Alert oriented pupils are normal, no pallor no icterus No throat injection dentition is poor has a plate at the upper mouth moderate dentition lower mouth. Cardiovascular: s1 s 2no m/r/g Respiratory: clear no added sound Abdomen: soft nt nd no rebound no gaurd Skin no le edema Neuro intact not sleepy  Data Reviewed: Basic Metabolic Panel:  Recent Labs Lab 09/10/15 2220 09/11/15 0626 09/11/15 1715 09/12/15 0130  NA 139  --  140 139  K 3.2*  --  3.2* 3.2*  CL 113*  --  114* 110  CO2 16*  --  15* 18*  GLUCOSE 128*  --  106* 157*  BUN 27*  --  19 18  CREATININE 0.79  --  0.97 0.90  CALCIUM 8.0* 7.4* 7.6* 7.6*  MG  --  1.5*  --  1.8  PHOS  --   --   --  3.0   Liver Function Tests:  Recent Labs Lab 09/11/15 1715  AST 21  ALT 17  ALKPHOS 72  BILITOT 0.9  PROT 6.1*  ALBUMIN 2.2*   No results for input(s): LIPASE, AMYLASE in the last 168 hours. No results for input(s): AMMONIA in the last 168 hours. CBC:  Recent Labs Lab 09/10/15 2220 09/11/15 1715 09/12/15 0130  WBC 6.0 10.1 6.5  NEUTROABS  --  7.7  --   HGB  10.8* 10.4* 9.4*  HCT 34.6* 32.0* 30.1*  MCV 74.1* 73.4* 73.2*  PLT 44* 53* 40*   Cardiac Enzymes:  Recent Labs Lab 09/10/15 2220 09/11/15 0626 09/11/15 1129 09/11/15 1715  TROPONINI 0.19* 0.03 0.03 0.14*   BNP: Invalid input(s): POCBNP CBG:  Recent Labs Lab 09/11/15 1544 09/11/15 1943 09/11/15 2359 09/12/15 0405  GLUCAP 99 127* 194* 143*    Recent Results (from the past 240 hour(s))  Culture, blood (single) w Reflex to ID Panel     Status: None (Preliminary result)   Collection Time: 09/11/15 12:40 AM  Result Value Ref Range Status   Specimen Description BLOOD RIGHT ARM  Final   Special Requests IN PEDIATRIC BOTTLE  Final   Culture  Setup Time   Final    GRAM POSITIVE COCCI IN CLUSTERS PEDIATRIC BOTTLE CALLED TO FLYNT,F RN 09/11/15 2046 WOOTEN,K CONFIRMED BY V WILKINS    Culture PENDING  Incomplete    Report Status PENDING  Incomplete  MRSA PCR Screening     Status: Abnormal   Collection Time: 09/11/15  6:54 PM  Result Value Ref Range Status   MRSA by PCR POSITIVE (A) NEGATIVE Final    Comment:        The GeneXpert MRSA Assay (FDA approved for NASAL specimens only), is one component of a comprehensive MRSA colonization surveillance program. It is not intended to diagnose MRSA infection nor to guide or monitor treatment for MRSA infections. RESULT CALLED TO, READ BACK BY AND VERIFIED WITH: T.CRITE,RN 09/11/15  BY V.WILKINS      Studies:              All Imaging reviewed and is as per above notation   Scheduled Meds: . Chlorhexidine Gluconate Cloth  6 each Topical Q0600  . clonazePAM  0.5 mg Oral Daily  . hydrocortisone sodium succinate  50 mg Intravenous Q6H  . morphine   Intravenous 6 times per day  . mupirocin ointment  1 application Nasal BID  . piperacillin-tazobactam (ZOSYN)  IV  3.375 g Intravenous 3 times per day  . potassium chloride  40 mEq Oral BID  . sodium chloride flush  3 mL Intravenous Q12H  . vancomycin  1,000 mg Intravenous Q8H   Continuous Infusions: . argatroban 0.1 mcg/kg/min (09/12/15 0429)     Assessment/Plan: 1.  severe sepsis with source IV drug abuse and . Very likely endocarditis-cardiology to be consult it as May need TEE-TTE report is pending as of 4/2 AM . Continue empiric vancomycin and Zosyn-appreciate ID input. Transition off hydrocortisone stress dose steroids 50 every 6-->50 every 12 09/12/2015.--high risk patient for Cardiac surgery. 2. Acute respiratory failure and Mixed metabolic acidosis-initially on bicarbonate gtt. 3 ampules because of severe acidosis likely secondary to sepsis-patient had a gap as well as a non-anion gap acidosis which was felt to be secondary to attempted respiratory compensation for the same in addition to chronic pain causing her to splint-her acidosis seems to be somewhat better and her bicarbonate today is  18 with an anion gap of 11. Her lactic acid is 2.1 which may contribute as well to this.-I agree with critical care that we should keep her dry based on her chest x-ray showing confluence potential fluid potential septic emboli potential pneumonia-->continue Lasix 40 every 12 for now. Repeat labs a.m. 3. Septic emboli-? History of lupus anticoagulant--just continue argatroban-supposed to be on lifelong anticoagulation. 4. History hepatitis C-never treated-diagnosed August 2015-obtained viral loads and reflex. ID to comment. Suggest testing for  HIV although patient states that she is HIV negative as of last couple of checks. 5. History of incarceration for 5 years-see above discussion 6. chronic pain-keep on morphine PCA. We will calculate long acting equivalent based on this and I have explained to her that cross tolerance occurs and that we will not get her pain controlled/withdrawal symptoms controlled 100%-she clearly understands this. Appreciate palliative input---Unclear if can sustain cessation efforts.  Suggested to her Subitx/methadone maintenance [has never tried b4]-to re-address late rthis admit 7. Hypokalemia-replace orally-she is eating-Kdur 40 bid, check am magnesium 8. Smoker-offer patch   Global-patient is improved and requires stepdown management I appreciate critical care input into her care Palliative care to see the patient and help [place] adjust her current dosage of PCA morphine to methadone which we will start likely tomorrow. ID to kindly see the patient after echocardiogram does return and we may need surveillance cultures after initial treatment and initial blood cultures resulted We will involve CVT S once more is known Appreciate CCM and Pallaitive care input Overall stable  >45 minutes coordinating this patient's care this morning Stepdown status Full code     Pleas Koch, MD  [facility] Pager [phone] 09/12/2015, 7:13 AM    LOS: 1 day

## 2015-09-13 ENCOUNTER — Other Ambulatory Visit (HOSPITAL_COMMUNITY): Payer: Self-pay

## 2015-09-13 DIAGNOSIS — N179 Acute kidney failure, unspecified: Secondary | ICD-10-CM

## 2015-09-13 DIAGNOSIS — R7881 Bacteremia: Secondary | ICD-10-CM | POA: Diagnosis present

## 2015-09-13 DIAGNOSIS — B9562 Methicillin resistant Staphylococcus aureus infection as the cause of diseases classified elsewhere: Secondary | ICD-10-CM

## 2015-09-13 LAB — COMPREHENSIVE METABOLIC PANEL
ALBUMIN: 2.1 g/dL — AB (ref 3.5–5.0)
ALK PHOS: 59 U/L (ref 38–126)
ALT: 12 U/L — ABNORMAL LOW (ref 14–54)
ANION GAP: 9 (ref 5–15)
AST: 12 U/L — ABNORMAL LOW (ref 15–41)
BUN: 16 mg/dL (ref 6–20)
CO2: 22 mmol/L (ref 22–32)
Calcium: 7.8 mg/dL — ABNORMAL LOW (ref 8.9–10.3)
Chloride: 109 mmol/L (ref 101–111)
Creatinine, Ser: 0.9 mg/dL (ref 0.44–1.00)
GFR calc Af Amer: >60 mL/min (ref >60)
GFR calc non Af Amer: >60 mL/min (ref >60)
GLUCOSE: 131 mg/dL — AB (ref 65–99)
POTASSIUM: 3.7 mmol/L (ref 3.5–5.1)
SODIUM: 140 mmol/L (ref 135–145)
Total Bilirubin: 0.6 mg/dL (ref 0.3–1.2)
Total Protein: 6.2 g/dL — ABNORMAL LOW (ref 6.5–8.1)

## 2015-09-13 LAB — HIV ANTIBODY (ROUTINE TESTING W REFLEX): HIV Screen 4th Generation wRfx: NONREACTIVE

## 2015-09-13 LAB — HEPARIN INDUCED PLATELET AB (HIT ANTIBODY): HEPARIN INDUCED PLT AB: 0.511 {OD_unit} — AB (ref 0.000–0.400)

## 2015-09-13 LAB — CBC
HEMATOCRIT: 31.2 % — AB (ref 36.0–46.0)
Hemoglobin: 9.7 g/dL — ABNORMAL LOW (ref 12.0–15.0)
MCH: 23 pg — ABNORMAL LOW (ref 26.0–34.0)
MCHC: 31.1 g/dL (ref 30.0–36.0)
MCV: 74.1 fL — AB (ref 78.0–100.0)
PLATELETS: 77 10*3/uL — AB (ref 150–400)
RBC: 4.21 MIL/uL (ref 3.87–5.11)
RDW: 17.7 % — ABNORMAL HIGH (ref 11.5–15.5)
WBC: 8.6 10*3/uL (ref 4.0–10.5)

## 2015-09-13 LAB — GLUCOSE, CAPILLARY
GLUCOSE-CAPILLARY: 191 mg/dL — AB (ref 65–99)
Glucose-Capillary: 114 mg/dL — ABNORMAL HIGH (ref 65–99)

## 2015-09-13 LAB — PROTIME-INR
INR: 1.32 (ref 0.00–1.49)
Prothrombin Time: 16.5 seconds — ABNORMAL HIGH (ref 11.6–15.2)

## 2015-09-13 LAB — CULTURE, BLOOD (SINGLE)

## 2015-09-13 LAB — MAGNESIUM: MAGNESIUM: 1.6 mg/dL — AB (ref 1.7–2.4)

## 2015-09-13 LAB — VANCOMYCIN, TROUGH: Vancomycin Tr: 29 ug/mL (ref 10.0–20.0)

## 2015-09-13 LAB — ANTI-DNA ANTIBODY, DOUBLE-STRANDED: ds DNA Ab: 3 IU/mL (ref 0–9)

## 2015-09-13 MED ORDER — MAGNESIUM OXIDE 400 (241.3 MG) MG PO TABS: 400.0000 mg | ORAL_TABLET | Freq: Every day | ORAL | Status: DC

## 2015-09-13 MED ORDER — GENTAMICIN SULFATE 40 MG/ML IJ SOLN
70.0000 mg | Freq: Three times a day (TID) | INTRAMUSCULAR | Status: DC
  Administered 2015-09-13 – 2015-09-20 (×21): 70 mg via INTRAVENOUS
  Filled 2015-09-13 (×25): qty 1.75

## 2015-09-13 MED ORDER — CLONIDINE HCL 0.1 MG PO TABS
0.1000 mg | ORAL_TABLET | Freq: Two times a day (BID) | ORAL | Status: DC
  Administered 2015-09-14 – 2015-09-16 (×6): 0.1 mg via ORAL
  Filled 2015-09-13 (×9): qty 1

## 2015-09-13 MED ORDER — MORPHINE SULFATE ER 30 MG PO TBCR
30.0000 mg | EXTENDED_RELEASE_TABLET | Freq: Two times a day (BID) | ORAL | Status: DC
  Administered 2015-09-13 – 2015-09-15 (×5): 30 mg via ORAL
  Filled 2015-09-13 (×5): qty 1

## 2015-09-13 MED ORDER — METHOCARBAMOL 500 MG PO TABS
500.0000 mg | ORAL_TABLET | Freq: Four times a day (QID) | ORAL | Status: DC | PRN
  Administered 2015-09-14 – 2015-09-21 (×6): 500 mg via ORAL
  Filled 2015-09-13 (×7): qty 1

## 2015-09-13 MED ORDER — VANCOMYCIN HCL 10 G IV SOLR
1250.0000 mg | Freq: Two times a day (BID) | INTRAVENOUS | Status: DC
  Administered 2015-09-14 – 2015-09-16 (×6): 1250 mg via INTRAVENOUS
  Filled 2015-09-13 (×8): qty 1250

## 2015-09-13 MED ORDER — OXYCODONE-ACETAMINOPHEN 5-325 MG PO TABS
1.0000 | ORAL_TABLET | ORAL | Status: DC | PRN
  Administered 2015-09-13 – 2015-09-14 (×5): 1 via ORAL
  Filled 2015-09-13 (×5): qty 1

## 2015-09-13 NOTE — Progress Notes (Signed)
[facility] refusing medical care, pulling at lines, and saying that she wants to die.  Patient sitter is currently at bedside, will continue to monitor patient.

## 2015-09-13 NOTE — Progress Notes (Signed)
Subjective:  C/o hurting all over  Antibiotics:  Anti-infectives    Start     Dose/Rate Route Frequency Ordered Stop   09/13/15 1000  gentamicin (GARAMYCIN) 70 mg in dextrose 5 % 50 mL IVPB     70 mg 103.5 mL/hr over 30 Minutes Intravenous Every 8 hours 09/13/15 0938     09/12/15 1415  rifampin (RIFADIN) 300 mg in sodium chloride 0.9 % 100 mL IVPB     300 mg 200 mL/hr over 30 Minutes Intravenous 3 times per day 09/12/15 1408     09/12/15 1100  ceFAZolin (ANCEF) IVPB 2g/100 mL premix  Status:  Discontinued     2 g 200 mL/hr over 30 Minutes Intravenous Every 8 hours 09/12/15 0957 09/13/15 0907   09/11/15 0900  vancomycin (VANCOCIN) IVPB 1000 mg/200 mL premix     1,000 mg 200 mL/hr over 60 Minutes Intravenous Every 8 hours 09/11/15 0644     09/11/15 0645  piperacillin-tazobactam (ZOSYN) IVPB 3.375 g  Status:  Discontinued     3.375 g 12.5 mL/hr over 240 Minutes Intravenous 3 times per day 09/11/15 0644 09/12/15 0946   09/11/15 0100  ceFEPIme (MAXIPIME) 2 g in dextrose 5 % 50 mL IVPB     2 g 100 mL/hr over 30 Minutes Intravenous  Once 09/11/15 0056 09/11/15 0352   09/11/15 0045  vancomycin (VANCOCIN) IVPB 1000 mg/200 mL premix     1,000 mg 200 mL/hr over 60 Minutes Intravenous  Once 09/11/15 0040 09/11/15 0207      Medications: Scheduled Meds: . Chlorhexidine Gluconate Cloth  6 each Topical Q0600  . clonazePAM  0.5 mg Oral Daily  . furosemide  40 mg Intravenous Q12H  . gentamicin  70 mg Intravenous Q8H  . magnesium oxide  400 mg Oral Daily  . morphine  30 mg Oral Q12H  . mupirocin ointment  1 application Nasal BID  . potassium chloride  40 mEq Oral BID  . rifampin (RIFADIN) IVPB  300 mg Intravenous 3 times per day  . sodium chloride flush  3 mL Intravenous Q12H  . vancomycin  1,000 mg Intravenous Q8H   Continuous Infusions:  PRN Meds:.acetaminophen **OR** [DISCONTINUED] acetaminophen, bisacodyl, methocarbamol, oxyCODONE-acetaminophen, polyethylene  glycol    Objective: Weight change: -2 lb 6.8 oz (-1.1 kg)  Intake/Output Summary (Last 24 hours) at 09/13/15 1524 Last data filed at 09/13/15 1200  Gross per 24 hour  Intake 1411.75 ml  Output   3100 ml  Net -1688.25 ml   Blood pressure 113/92, pulse 96, temperature 97.9 F (36.6 C), temperature source Oral, resp. rate 22, height  (1.626 m), weight 161 lb 9.6 oz (73.3 kg), last menstrual period 04/12/2015, SpO2 90 %. Temp:  [97.9 F (36.6 C)-98.6 F (37 C)] 97.9 F (36.6 C) (04/03 1225) Pulse Rate:  [62-99] 96 (04/03 1100) Resp:  [12-22] 22 (04/03 1100) BP: (102-123)/(83-100) 113/92 mmHg (04/03 1100) SpO2:  [87 %-95 %] 90 % (04/03 1100) Weight:  [161 lb 9.6 oz (73.3 kg)] 161 lb 9.6 oz (73.3 kg) (04/03 0357)  Physical Exam: General: aox3 HEENT: anicteric sclera, EOMI, oropharynx clear and without exudate Cardiovascular: regular rate, normal r, i could not hear murmur with contact precaution stethoscope Pulmonary: Fairly clear Gastrointestinal: soft nontender, nondistended, normal bowel sounds, Musculoskeletal: no clubbing or edema noted bilaterally Skin, soft tissue: Multiple areas where she has injected intravenous drugs no obvious abscess Neuro: nonfocal, strength and sensation intact  CBC:  CBC Latest Ref  Rng 09/13/2015 09/12/2015 09/11/2015  WBC 4.0 - 10.5 K/uL 8.6 6.5 10.1  Hemoglobin 12.0 - 15.0 g/dL 1.6(X) 0.9(U) 10.4(L)  Hematocrit 36.0 - 46.0 % 31.2(L) 30.1(L) 32.0(L)  Platelets 150 - 400 K/uL 77(L) 40(L) 53(L)       BMET  Recent Labs  09/12/15 0130 09/13/15 0232  NA 139 140  K 3.2* 3.7  CL 110 109  CO2 18* 22  GLUCOSE 157* 131*  BUN 18 16  CREATININE 0.90 0.90  CALCIUM 7.6* 7.8*     Liver Panel   Recent Labs  09/11/15 1715 09/13/15 0232  PROT 6.1* 6.2*  ALBUMIN 2.2* 2.1*  AST 21 12*  ALT 17 12*  ALKPHOS 72 59  BILITOT 0.9 0.6       Sedimentation Rate No results for input(s): ESRSEDRATE in the last 72 hours. C-Reactive  Protein No results for input(s): CRP in the last 72 hours.  Micro Results: Recent Results (from the past 720 hour(s))  Blood culture (routine x 2)     Status: None (Preliminary result)   Collection Time: 09/10/15 11:50 PM  Result Value Ref Range Status   Specimen Description BLOOD RIGHT ARM  Final   Special Requests BOTTLES DRAWN AEROBIC AND ANAEROBIC  Final   Culture NO GROWTH 2 DAYS  Final   Report Status PENDING  Incomplete  Blood culture (routine x 2)     Status: None (Preliminary result)   Collection Time: 09/10/15 11:55 PM  Result Value Ref Range Status   Specimen Description BLOOD RIGHT HAND  Final   Special Requests BOTTLES DRAWN AEROBIC AND ANAEROBIC  Final   Culture NO GROWTH 2 DAYS  Final   Report Status PENDING  Incomplete  Culture, blood (single) w Reflex to ID Panel     Status: None   Collection Time: 09/11/15 12:40 AM  Result Value Ref Range Status   Specimen Description BLOOD RIGHT ARM  Final   Special Requests IN PEDIATRIC BOTTLE  Final   Culture  Setup Time   Final    GRAM POSITIVE COCCI IN CLUSTERS PEDIATRIC BOTTLE CALLED TO FLYNT,F RN 09/11/15 2046 WOOTEN,K CONFIRMED BY V WILKINS    Culture METHICILLIN RESISTANT STAPHYLOCOCCUS AUREUS  Final   Report Status 09/13/2015 FINAL  Final   Organism ID, Bacteria METHICILLIN RESISTANT STAPHYLOCOCCUS AUREUS  Final      Susceptibility   Methicillin resistant staphylococcus aureus - MIC*    CIPROFLOXACIN >=8 RESISTANT Resistant     ERYTHROMYCIN >=8 RESISTANT Resistant     GENTAMICIN <=0.5 SENSITIVE Sensitive     OXACILLIN >=4 RESISTANT Resistant     TETRACYCLINE <=1 SENSITIVE Sensitive     VANCOMYCIN 1 SENSITIVE Sensitive     TRIMETH/SULFA <=10 SENSITIVE Sensitive     CLINDAMYCIN <=0.25 SENSITIVE Sensitive     RIFAMPIN <=0.5 SENSITIVE Sensitive     Inducible Clindamycin NEGATIVE Sensitive     * METHICILLIN RESISTANT STAPHYLOCOCCUS AUREUS  MRSA PCR Screening     Status: Abnormal   Collection Time:  09/11/15  6:54 PM  Result Value Ref Range Status   MRSA by PCR POSITIVE (A) NEGATIVE Final    Comment:        The GeneXpert MRSA Assay (FDA approved for NASAL specimens only), is one component of a comprehensive MRSA colonization surveillance program. It is not intended to diagnose MRSA infection nor to guide or monitor treatment for MRSA infections. RESULT CALLED TO, READ BACK BY AND VERIFIED WITH: T.CRITE,RN 09/11/15  BY V.WILKINS  Studies/Results: Dg Chest 2 View  09/12/2015  CLINICAL DATA:  Respiratory failure. EXAM: CHEST  2 VIEW COMPARISON:  September 11, 2015. FINDINGS: Stable cardiomegaly. No pneumothorax is noted. Mild bibasilar opacities are noted concerning for subsegmental atelectasis or possibly inflammation. Bony thorax is unremarkable. IMPRESSION: Mild bibasilar opacities concerning for subsegmental atelectasis or possibly inflammation. Electronically Signed   By: Lupita RaiderJames  Green Jr, M.D.   On: 09/12/2015 09:11   Dg Chest Portable 1 View  09/11/2015  CLINICAL DATA:  Sepsis, endocarditis, fever EXAM: PORTABLE CHEST 1 VIEW COMPARISON:  Portable exam 1548 hours compared to 09/10/2015 and correlated with chest CT of 09/11/2015 FINDINGS: Normal size of cardiac silhouette post TVR. Mediastinal contours and pulmonary vascularity normal. Lungs clear. No pleural effusion or pneumothorax. Bones unremarkable. IMPRESSION: No acute abnormalities. Electronically Signed   By: Ulyses SouthwardMark  Boles M.D.   On: 09/11/2015 15:58      Assessment/Plan:  INTERVAL HISTORY:   09/11/15: blood cultures still +   Principal Problem:   Narcotic dependence (HCC) Active Problems:   IV drug abuse   Anemia   Thrombocytopenia, unspecified (HCC)   Hepatitis C   Hypokalemia   Septic embolism (HCC)   Endocarditis   Prolonged Q-T interval on ECG   Acute endocarditis   Palliative care encounter   Respiratory failure (HCC)   Endocarditis of native valve   Staphylococcus aureus bacteremia with sepsis  (HCC)   Acute renal failure (HCC)   Narcotic abuse    Melanie Verdie Shireheresa Crane is a 40 y.o. 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 [facility] suggest that she may potentially have a mitral valve vegetation as well.  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

## 2015-09-13 NOTE — Progress Notes (Signed)
Pharmacy Antibiotic Note Melanie Crane is a 40 y.o. female admitted on 09/10/2015 MRSA bacteremia in setting of recurrent prosthetic valve endocarditis. VT drawn this evening was 29 (goal ~ 20 based on indication), SCr 0.91 (baseline ~ 0.7)  Plan: 1. Vancomycin trough of 29 is above desired range of 15-20; will adjust dose to 1250 mg q 12 hours starting 4/4 am  2. Obtain level at SS 3. Continue gentamycin at current dose and interval; level has been ordered for 4/4 am which will help guide further dosing needs   Height: 5\' 4"  (162.6 cm) Weight: 161 lb 9.6 oz (73.3 kg) IBW/kg (Calculated) : 54.7  Temp (24hrs), Avg:98.6 F (37 C), Min:97.9 F (36.6 C), Max:100.3 F (37.9 C)   Recent Labs Lab 09/10/15 2220  09/11/15 0625 09/11/15 0948 09/11/15 1715 09/11/15 1730 09/11/15 2106 09/12/15 0130 09/13/15 0232 09/13/15 1619  WBC 6.0  --   --   --  10.1  --   --  6.5 8.6  --   CREATININE 0.79  --   --   --  0.97  --   --  0.90 0.90  --   LATICACIDVEN  --   < > 2.1* 2.0 1.3 1.22 2.1*  --   --   --   VANCOTROUGH  --   --   --   --   --   --   --   --   --  29*  < > = values in this interval not displayed.  Estimated Creatinine Clearance: 82.3 mL/min (by C-G formula based on Cr of 0.9).    No Known Allergies  Antimicrobials this admission: Vanc 4/1>> Rifampin 4/2 >> Gentamycin 4/3 >>   Cefazolin 4/2>> 4/3 Zosyn 4/1>>4/2 Cefepime 4/1 x 1 dose  Pertinent levels this admission: 4/3 VT 29 on 1 gram q8h and SCr 0.91   Microbiology results: 4/3 BCx: px 4/1 Blood - MRSA 4/1 MRSA - NEG 3/31 BCx: ngtd  Thank you for allowing pharmacy to be a part of this patient's care.  Pollyann SamplesAndy Bocephus Crane, PharmD, BCPS 09/13/2015, 6:56 PM Pager: [phone] '

## 2015-09-13 NOTE — Progress Notes (Signed)
Melanie Crane ZOX:[id number] DOB: [birth year] DOA: 09/10/2015 PCP: No PCP Per Patient  Brief narrative:  40 y.o. 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   Patient reports that she had just been admitted to a hospital in [place], but left AGAINST MEDICAL ADVICE due to disagreement with her treatment plan  Admitted to SDU   Past medical history-As per Problem list Chart reviewed as below-   Consultants:  ID  PCCM  Procedures:    Antibiotics:  Cefepime  vanc  rifampin   Subjective  See documentation from 09/11/15  Patient doing better Less short of breath Some desat overnight No cp No diarr No blurred or double vision   Objective    Interim Hist and :   Telemetry:    Objective: Filed Vitals:   09/13/15 0400 09/13/15 0410 09/13/15 0500 09/13/15 0600  BP: 108/92  112/85 114/90  Pulse: 79  86 85  Temp:  98.5 F (36.9 C)    TempSrc:  Oral    Resp: Height:      Weight:      SpO2: 93%  92% 92%    Intake/Output Summary (Last 24 hours) at 09/13/15 0729 Last data filed at 09/13/15 0600  Gross per 24 hour  Intake   1920 ml  Output   3450 ml  Net  -1530 ml    Exam:  General: Alert oriented pupils are normal, no pallor no icterus No throat injection dentition is poor has a plate at the upper mouth moderate dentition lower mouth. Cardiovascular: s1 s 2no m/r/g Respiratory: clear no added sound, decreased AE L post lung fields  Abdomen: soft nt nd no rebound no gaurd Skin no le edema  Data  Reviewed: Basic Metabolic Panel:  Recent Labs Lab 09/10/15 2220 09/11/15 0626 09/11/15 1715 09/12/15 0130 09/13/15 0232  NA 139  --  140 139 140  K 3.2*  --  3.2* 3.2* 3.7  CL 113*  --  114* 110 109  CO2 16*  --  15* 18* 22  GLUCOSE 128*  --  106* 157* 131*  BUN 27*  --  CREATININE 0.79  --  0.97 0.90 0.90  CALCIUM 8.0* 7.4* 7.6* 7.6* 7.8*  MG  --  1.5*  --  1.8 1.6*  PHOS  --   --   --  3.0  --    Liver Function Tests:  Recent Labs Lab 09/11/15 1715 09/13/15 0232  AST 21 12*  ALT 17 12*  ALKPHOS 72 59  BILITOT 0.9 0.6  PROT 6.1* 6.2*  ALBUMIN 2.2* 2.1*   No results for input(s): LIPASE, AMYLASE in the last 168 hours. No results for input(s): AMMONIA in the last 168 hours. CBC:  Recent Labs Lab 09/10/15 2220 09/11/15 1715 09/12/15 0130 09/13/15 0232  WBC 6.0 10.1 6.5 8.6  NEUTROABS  --  7.7  --   --   HGB 10.8* 10.4* 9.4* 9.7*  HCT 34.6* 32.0* 30.1* 31.2*  MCV 74.1* 73.4* 73.2* 74.1*  PLT 44* 53* 40* 77*   Cardiac  Enzymes:  Recent Labs Lab 09/10/15 2220 09/11/15 0626 09/11/15 1129 09/11/15 1715  TROPONINI 0.19* 0.03 0.03 0.14*   BNP: Invalid input(s): POCBNP CBG:  Recent Labs Lab 09/12/15 0405 09/12/15 1626 09/12/15 1927 09/12/15 2353 09/13/15 0408  GLUCAP 143* 108* 119* 191* 114*    Recent Results (from the past 240 hour(s))  Blood culture (routine x 2)     Status: None (Preliminary result)   Collection Time: 09/10/15 11:50 PM  Result Value Ref Range Status   Specimen Description BLOOD RIGHT ARM  Final   Special Requests BOTTLES DRAWN AEROBIC AND ANAEROBIC 5ML  Final   Culture NO GROWTH 1 DAY  Final   Report Status PENDING  Incomplete  Blood culture (routine x 2)     Status: None (Preliminary result)   Collection Time: 09/10/15 11:55 PM  Result Value Ref Range Status   Specimen Description BLOOD RIGHT HAND  Final   Special Requests BOTTLES DRAWN AEROBIC AND ANAEROBIC 5ML  Final   Culture NO GROWTH 1 DAY  Final    Report Status PENDING  Incomplete  Culture, blood (single) w Reflex to ID Panel     Status: None (Preliminary result)   Collection Time: 09/11/15 12:40 AM  Result Value Ref Range Status   Specimen Description BLOOD RIGHT ARM  Final   Special Requests IN PEDIATRIC BOTTLE 3ML  Final   Culture  Setup Time   Final    GRAM POSITIVE COCCI IN CLUSTERS PEDIATRIC BOTTLE CALLED TO FLYNT,F RN 09/11/15 2046 WOOTEN,K CONFIRMED BY V WILKINS    Culture STAPHYLOCOCCUS AUREUS  Final   Report Status PENDING  Incomplete  MRSA PCR Screening     Status: Abnormal   Collection Time: 09/11/15  6:54 PM  Result Value Ref Range Status   MRSA by PCR POSITIVE (A) NEGATIVE Final    Comment:        The GeneXpert MRSA Assay (FDA approved for NASAL specimens only), is one component of a comprehensive MRSA colonization surveillance program. It is not intended to diagnose MRSA infection nor to guide or monitor treatment for MRSA infections. RESULT CALLED TO, READ BACK BY AND VERIFIED WITH: T.CRITE,RN 09/11/15 @2042  BY V.WILKINS      Studies:              All Imaging reviewed and is as per above notation   Scheduled Meds: .  ceFAZolin (ANCEF) IV  2 g Intravenous Q8H  . Chlorhexidine Gluconate Cloth  6 each Topical Q0600  . clonazePAM  0.5 mg Oral Daily  . furosemide  40 mg Intravenous Q12H  . hydrocortisone sodium succinate  50 mg Intravenous Q12H  . magnesium oxide  400 mg Oral Daily  . morphine   Intravenous 6 times per day  . mupirocin ointment  1 application Nasal BID  . potassium chloride  40 mEq Oral BID  . rifampin (RIFADIN) IVPB  300 mg Intravenous 3 times per day  . sodium chloride flush  3 mL Intravenous Q12H  . vancomycin  1,000 mg Intravenous Q8H   Continuous Infusions:     Assessment/Plan: 1.  severe sepsis with source IV drug abuse and . Very likely endocarditis-cardiology to be consult it as May need TEE-TTE report is pending as of 4/2 AM . Continue empiric vancomycin and  Zosyn-appreciate ID input. Transition off hydrocortisone stress dose steroids 50 every 6-->off 09/12/2015--high risk patient for Cardiac surgery. 2. Acute respiratory failure and Mixed metabolic acidosis-initially on bicarbonate gtt. 3 ampules because of severe acidosis  likely secondary to sepsis--resolved as of 09/13/15-- potential septic emboli/ potential pneumonia-->rec'ds initially IV Lasix 40 q 12 --change to lasix po 40 bid 09/13/15.  Replace magnesium.  Labs am.  Ok with sats 87-92%.  Ambulate. oob. 3. Septic emboli-? History of lupus anticoagulant--d/c Argatroban as per discussion Dr. Candise Che 09/12/15.  Needs rpt Anti-phospholipid AB 12 weeks from time of Rx of Endocarditis 4. History hepatitis C- Hep C is +-diagnosed August 2015- viral loads 93, 436-- ID to comment.  HIV is negative. 5. Severe TCP-2/2 to sepsis-improving significantly-should get better in time-PLT count 04/2015 200 range 6. History of incarceration for 5 years-see above discussion 7. chronic pain-keep on morphine PCA. We will calculate long acting equivalent based on this and I have explained to her that cross tolerance occurs and that we will not get her pain controlled/withdrawal symptoms controlled 100%-she clearly understands this. Suggested to her Subitx/methadone maintenance  8. Hypokalemia-replace orally-she is eating-Kdur 40 bid.  Labs am  9. Smoker-offer patch   Global-patient is improved-trasnfer to tele 09/13/15 Main issues now are Rx Endocarditis Appreciate CCM and Pallaitive care input Overall stable  >25 minutes coordinating this patient's care this morning Full code     Pleas Koch, MD  [facility] Pager [phone] 09/13/2015, 7:29 AM    LOS: 2 days

## 2015-09-13 NOTE — Progress Notes (Signed)
Pt has stated twice tonight that she wanted to die. Also, pt refusing aspects of care and wanting to leave. This NP called psych on call since they have seen pt. Suggested IVC. This NP called my colleague, Dr. Toniann FailKakrakandy, of [facility] and he IVC'd the pt.  KJKG, NP [facility]

## 2015-09-13 NOTE — Progress Notes (Signed)
Brother from [place] called to say that the patient text him that " it is over, goodbye" Patient watching TV did not confront patient will notifiy MD.

## 2015-09-13 NOTE — Consult Note (Addendum)
[place] Psychiatry Consult   Reason for Consult:  Capacity evaluation on substance abuse Referring Physician:  Dr. Verlon Au Patient Identification: Melanie Crane MRN:  [id number] Principal Diagnosis: Narcotic dependence [facility]) Diagnosis:   Patient Active Problem List   Diagnosis Date Noted  . Respiratory failure ([place]) [J96.90]   . 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 Staphylococcus aureus bacteremia with sepsis ([place]) [A41.01]   . Acute renal failure ([place]) [N17.9]   . Narcotic abuse [F11.10]   . Prolonged Q-T interval on ECG [I45.81] 09/11/2015  . Normocytic anemia [D64.9]   . Acute endocarditis [I33.9]   . Narcotic dependence ([place]) [F11.20]   . Palliative care encounter [Z51.5]   . Pain in the chest [R07.9]   . Other iron deficiency anemias [D50.8]   . Acute septic pulmonary embolism without acute cor pulmonale (HCC) [I26.90]   . Sepsis ([place]) [A41.9]   . 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 Embolism, pulmonary with infarction ([place]) [I26.99] 04/11/2015  . Endocarditis [I38] 04/11/2015  . Abscess of right lung with pneumonia ([place]) [J85.1]   . Pulmonary embolism ([place]) [I26.99] 04/10/2015  . Septic embolism ([place]) [I26.90] 04/10/2015  . Lung abscess ([place]) [J85.2] 04/10/2015  . PE (pulmonary embolism) [I26.99] 04/10/2015  . Hepatitis C [B19.20] 12/30/2013  . Hypokalemia [E87.6] 12/30/2013  . UTI (urinary tract infection) [N39.0] 12/30/2013  . Acute bacterial endocarditis - right sided [I33.0] 12/29/2013  . IV drug abuse [F19.10] 12/29/2013  . Anemia [D64.9] 12/29/2013  . Thrombocytopenia, unspecified ([place]) [D69.6] 12/29/2013  . Cigarette smoker [Z72.0] 12/29/2013  . Shock ([place]) [R57.9] 12/28/2013  . Secondary amenorrhea [N91.1] 12/28/2013  . Bacterial myositis [M60.09] 12/28/2013    Total Time spent with patient: 1 hour  Subjective:   Melanie Crane is a 40 y.o. female patient admitted with chest pain and shortness of breath.  HPI:  Melanie Crane is a 40 years old married female admitted to [facility] with the shortness of breath and chest pain. Patient has been suffering with opioid dependence and had 3 previous episodes of endocarditis that required open heart surgery in [place] about a year and half ago. Patient reportedly relapsed on drug of abuse about 2 months ago after being sober one and half years. Patient reported she came to [place] and then she relapsed with Opana a intravenous drug use. Patient reportedly abusing marijuana and opioids since age 75 years old. Patient reported her sister died secondary to Opana dependence and related infection in January 2015. Patient reportedly abusing same medication since her sister died. Patient denied drug abuse in biological parents and 3 brothers who live in different states. Patient has a 3 children ages 7, 4 and 34 who has been under care of paternal grandparents. Patient also suffering with hepatitis C secondary to IV drug use. Patient has intact cognitions including orientation, concentration, memory and language functions. Patient denies symptoms of depression, anxiety, bipolar mania, auditory/visual hallucinations, delusions and paranoia. Patient denied suicidal or homicidal ideation. Patient is willing to receive substance abuse rehabilitation and also medication if needed.  Past Psychiatric History: Patient denied history of substance abuse rehabilitation and mental health treatment.    Risk to Self: Is patient at risk for suicide?: No Risk to Others:   Prior Inpatient Therapy:   Prior Outpatient Therapy:    Past Medical History:  Past Medical History  Diagnosis Date  . Anemia   . IVDU (intravenous drug user)   . Acute bacterial endocarditis - right  sided 12/29/2013    Group B Streptococcus   . Hepatitis C   . Miscarriage     Five times    Past Surgical History  Procedure Laterality Date  . Bunionectomy    . Tonsillectomy    . 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 Tee without cardioversion N/A 04/14/2015    Procedure: TRANSESOPHAGEAL ECHOCARDIOGRAM (TEE);  Surgeon: Lelon Perla, MD;  Location: [facility] ENDOSCOPY;  Service: Cardiovascular;  Laterality: N/A;   Family History:  Family History  Problem Relation Age of Onset  . Hypertension Father   . Kidney failure Father   . Hyperlipidemia Mother   . CAD Mother   . Deafness Mother    Family Psychiatric  History: Patient's sister suffered with a drug of abuse and died secondary to drug related infection. Social History:  History  Alcohol Use No     History  Drug Use  . Yes  . Special: IV    Comment: injectable opana in L forarm for 2-3 month    Social History   Social History  . Marital Status: Divorced    Spouse Name: N/A  . Number of Children: N/A  . Years of Education: N/A   Occupational History  . Unemployed    Social History Main Topics  . Smoking status: Current Every Day Smoker    Types: Cigarettes  . Smokeless tobacco: Never Used     Comment: 1 1/2 ppd  . Alcohol Use: No  . Drug Use: Yes    Special: IV     Comment: injectable opana in L forarm for 2-3 month  . Sexual Activity: Not Asked   Other Topics Concern  . None   Social History Narrative   Was living in [place], [place]. In [place] visiting family.   Additional Social History:    Allergies:  No Known Allergies  Labs:  Results for orders placed or performed during the hospital encounter of 09/10/15 (from the past 48 hour(s))  I-Stat arterial blood gas, ED     Status: Abnormal   Collection Time: 09/11/15  3:40 PM  Result Value Ref Range   pH, Arterial 7.470 (H) 7.350 - 7.450   pCO2 arterial 20.1 (L) 35.0 - 45.0 mmHg   pO2, Arterial 51.0 (L) 80.0 - 100.0 mmHg   Bicarbonate 14.6 (L) 20.0 - 24.0 mEq/L   TCO2 15 0 - 100 mmol/L   O2 Saturation 89.0 %   Acid-base deficit 7.0 (H) 0.0 - 2.0 mmol/L   Patient temperature 99.1 F    Collection  site RADIAL, ALLEN'S TEST ACCEPTABLE    Drawn by RT    Sample type ARTERIAL    Comment NOTIFIED PHYSICIAN   CBG monitoring, ED     Status: None   Collection Time: 09/11/15  3:44 PM  Result Value Ref Range   Glucose-Capillary 99 65 - 99 mg/dL  Troponin I     Status: Abnormal   Collection Time: 09/11/15  5:15 PM  Result Value Ref Range   Troponin I 0.14 (H) <0.031 ng/mL    Comment:        PERSISTENTLY INCREASED TROPONIN VALUES IN THE RANGE OF 0.04-0.49 ng/mL CAN BE SEEN IN:       -UNSTABLE ANGINA       -CONGESTIVE HEART FAILURE       -MYOCARDITIS       -CHEST TRAUMA       -ARRYHTHMIAS       -  LATE PRESENTING MYOCARDIAL INFARCTION       -COPD   CLINICAL FOLLOW-UP RECOMMENDED.   CBC with Differential/Platelet     Status: Abnormal   Collection Time: 09/11/15  5:15 PM  Result Value Ref Range   WBC 10.1 4.0 - 10.5 K/uL   RBC 4.36 3.87 - 5.11 MIL/uL   Hemoglobin 10.4 (L) 12.0 - 15.0 g/dL   HCT 32.0 (L) 36.0 - 46.0 %   MCV 73.4 (L) 78.0 - 100.0 fL   MCH 23.9 (L) 26.0 - 34.0 pg   MCHC 32.5 30.0 - 36.0 g/dL   RDW 17.8 (H) 11.5 - 15.5 %   Platelets 53 (L) 150 - 400 K/uL    Comment: CONSISTENT WITH PREVIOUS RESULT   Neutrophils Relative % 76 %   Lymphocytes Relative 11 %   Monocytes Relative 13 %   Eosinophils Relative 0 %   Basophils Relative 0 %   Neutro Abs 7.7 1.7 - 7.7 K/uL   Lymphs Abs 1.1 0.7 - 4.0 K/uL   Monocytes Absolute 1.3 (H) 0.1 - 1.0 K/uL   Eosinophils Absolute 0.0 0.0 - 0.7 K/uL   Basophils Absolute 0.0 0.0 - 0.1 K/uL   WBC Morphology ATYPICAL LYMPHOCYTES   Comprehensive metabolic panel     Status: Abnormal   Collection Time: 09/11/15  5:15 PM  Result Value Ref Range   Sodium 140 135 - 145 mmol/L   Potassium 3.2 (L) 3.5 - 5.1 mmol/L   Chloride 114 (H) 101 - 111 mmol/L   CO2 15 (L) 22 - 32 mmol/L   Glucose, Bld 106 (H) 65 - 99 mg/dL   BUN 19 6 - 20 mg/dL   Creatinine, Ser 0.97 0.44 - 1.00 mg/dL   Calcium 7.6 (L) 8.9 - 10.3 mg/dL   Total Protein 6.1 (L) 6.5  - 8.1 g/dL   Albumin 2.2 (L) 3.5 - 5.0 g/dL   AST 21 15 - 41 U/L   ALT 17 14 - 54 U/L   Alkaline Phosphatase 72 38 - 126 U/L   Total Bilirubin 0.9 0.3 - 1.2 mg/dL   GFR calc non Af Amer >60 >60 mL/min   GFR calc Af Amer >60 >60 mL/min    Comment: (NOTE) The eGFR has been calculated using the CKD EPI equation. This calculation has not been validated in all clinical situations. eGFR's persistently <60 mL/min signify possible Chronic Kidney Disease.    Anion gap 11 5 - 15  Volatiles,Blood (acetone,ethanol,isoprop,methanol)     Status: None   Collection Time: 09/11/15  5:15 PM  Result Value Ref Range   Acetone, blood None Detected 0.000 - 0.010 %    Comment: (NOTE) RESULTS CALLED TO RIA WILSON ON 09-11-2015 AT 21:15. TESTING PERFORMED BY NANCY DENNY.                                Detection Limit = 0.010 This test was developed and its performance characteristics determined by [facility]. It has not been cleared or approved by the Food and Drug Administration.    Ethanol, blood None Detected 0.000 - 0.010 %    Comment:                                 Detection Limit = 0.010   Isopropanol, blood None Detected 0.000 - 0.010 %    Comment:  Detection Limit = 0.010   Methanol, blood None Detected 0.000 - 0.010 %    Comment: (NOTE)                                Detection Limit = 0.010 Performed At: [facility] [place], [place] [id number] Lindon Romp MD EH:[id number]   Lactic acid, plasma     Status: None   Collection Time: 09/11/15  5:15 PM  Result Value Ref Range   Lactic Acid, Venous 1.3 0.5 - 2.0 mmol/L  I-Stat CG4 Lactic Acid, ED     Status: None   Collection Time: 09/11/15  5:30 PM  Result Value Ref Range   Lactic Acid, Venous 1.22 0.5 - 2.0 mmol/L  I-Stat arterial blood gas, ED     Status: Abnormal   Collection Time: 09/11/15  5:45 PM  Result Value Ref Range   pH, Arterial 7.451 (H) 7.350 - 7.450   pCO2 arterial  23.4 (L) 35.0 - 45.0 mmHg   pO2, Arterial 58.0 (L) 80.0 - 100.0 mmHg   Bicarbonate 16.2 (L) 20.0 - 24.0 mEq/L   TCO2 17 0 - 100 mmol/L   O2 Saturation 91.0 %   Acid-base deficit 6.0 (H) 0.0 - 2.0 mmol/L   Patient temperature 99.1 F    Sample type ARTERIAL   MRSA PCR Screening     Status: Abnormal   Collection Time: 09/11/15  6:54 PM  Result Value Ref Range   MRSA by PCR POSITIVE (A) NEGATIVE    Comment:        The GeneXpert MRSA Assay (FDA approved for NASAL specimens only), is one component of a comprehensive MRSA colonization surveillance program. It is not intended to diagnose MRSA infection nor to guide or monitor treatment for MRSA infections. RESULT CALLED TO, READ BACK BY AND VERIFIED WITH: T.CRITE,RN 09/11/15 @2042  BY V.WILKINS   Glucose, capillary     Status: Abnormal   Collection Time: 09/11/15  7:43 PM  Result Value Ref Range   Glucose-Capillary 127 (H) 65 - 99 mg/dL   Comment 1 Notify RN   Lactic acid, plasma     Status: Abnormal   Collection Time: 09/11/15  9:06 PM  Result Value Ref Range   Lactic Acid, Venous 2.1 (HH) 0.5 - 2.0 mmol/L    Comment: CRITICAL RESULT CALLED TO, READ BACK BY AND VERIFIED WITH: CROSS M,RN 09/11/15 2209 WAYK   Cortisol     Status: None   Collection Time: 09/11/15  9:06 PM  Result Value Ref Range   Cortisol, Plasma 55.8 ug/dL    Comment: RESULTS CONFIRMED BY MANUAL DILUTION (NOTE) AM    6.7 - 22.6 ug/dL PM   <10.0       ug/dL   APTT     Status: Abnormal   Collection Time: 09/11/15  9:06 PM  Result Value Ref Range   aPTT >200 (HH) 24 - 37 seconds    Comment:        IF BASELINE aPTT IS ELEVATED, SUGGEST PATIENT RISK ASSESSMENT BE USED TO DETERMINE APPROPRIATE ANTICOAGULANT THERAPY. REPEATED TO VERIFY CRITICAL RESULT CALLED TO, READ BACK BY AND VERIFIED WITH: FLYNT [facility] 09/11/15 2158 WAYK   Glucose, capillary     Status: Abnormal   Collection Time: 09/11/15 11:59 PM  Result Value Ref Range   Glucose-Capillary 194 (H) 65  - 99 mg/dL   Comment 1 Notify RN   APTT  Status: Abnormal   Collection Time: 09/12/15  1:30 AM  Result Value Ref Range   aPTT >200 (HH) 24 - 37 seconds    Comment:        IF BASELINE aPTT IS ELEVATED, SUGGEST PATIENT RISK ASSESSMENT BE USED TO DETERMINE APPROPRIATE ANTICOAGULANT THERAPY. REPEATED TO VERIFY CRITICAL RESULT CALLED TO, READ BACK BY AND VERIFIED WITH: F.FLINT,RN 0234 09/12/15 M.CAMPBELL   Basic metabolic panel     Status: Abnormal   Collection Time: 09/12/15  1:30 AM  Result Value Ref Range   Sodium 139 135 - 145 mmol/L   Potassium 3.2 (L) 3.5 - 5.1 mmol/L   Chloride 110 101 - 111 mmol/L   CO2 18 (L) 22 - 32 mmol/L   Glucose, Bld 157 (H) 65 - 99 mg/dL   BUN 18 6 - 20 mg/dL   Creatinine, Ser 0.90 0.44 - 1.00 mg/dL   Calcium 7.6 (L) 8.9 - 10.3 mg/dL   GFR calc non Af Amer >60 >60 mL/min   GFR calc Af Amer >60 >60 mL/min    Comment: (NOTE) The eGFR has been calculated using the CKD EPI equation. This calculation has not been validated in all clinical situations. eGFR's persistently <60 mL/min signify possible Chronic Kidney Disease.    Anion gap 11 5 - 15  CBC     Status: Abnormal   Collection Time: 09/12/15  1:30 AM  Result Value Ref Range   WBC 6.5 4.0 - 10.5 K/uL   RBC 4.11 3.87 - 5.11 MIL/uL   Hemoglobin 9.4 (L) 12.0 - 15.0 g/dL   HCT 30.1 (L) 36.0 - 46.0 %   MCV 73.2 (L) 78.0 - 100.0 fL   MCH 22.9 (L) 26.0 - 34.0 pg   MCHC 31.2 30.0 - 36.0 g/dL   RDW 17.5 (H) 11.5 - 15.5 %   Platelets 40 (L) 150 - 400 K/uL    Comment: CONSISTENT WITH PREVIOUS RESULT  Magnesium     Status: None   Collection Time: 09/12/15  1:30 AM  Result Value Ref Range   Magnesium 1.8 1.7 - 2.4 mg/dL  Phosphorus     Status: None   Collection Time: 09/12/15  1:30 AM  Result Value Ref Range   Phosphorus 3.0 2.5 - 4.6 mg/dL  Protime-INR     Status: Abnormal   Collection Time: 09/12/15  1:30 AM  Result Value Ref Range   Prothrombin Time 24.7 (H) 11.6 - 15.2 seconds   INR 2.26  (H) 0.00 - 1.49  I-STAT 3, arterial blood gas (G3+)     Status: Abnormal   Collection Time: 09/12/15  2:49 AM  Result Value Ref Range   pH, Arterial 7.464 (H) 7.350 - 7.450   pCO2 arterial 25.2 (L) 35.0 - 45.0 mmHg   pO2, Arterial 56.0 (L) 80.0 - 100.0 mmHg   Bicarbonate 18.1 (L) 20.0 - 24.0 mEq/L   TCO2 19 0 - 100 mmol/L   O2 Saturation 91.0 %   Acid-base deficit 5.0 (H) 0.0 - 2.0 mmol/L   Patient temperature 98.3 F    Collection site RADIAL, ALLEN'S TEST ACCEPTABLE    Drawn by RT    Sample type ARTERIAL   Glucose, capillary     Status: Abnormal   Collection Time: 09/12/15  4:05 AM  Result Value Ref Range   Glucose-Capillary 143 (H) 65 - 99 mg/dL   Comment 1 Notify RN    Comment 2 Call MD NNP PA CNM   APTT     Status:  Abnormal   Collection Time: 09/12/15  8:23 AM  Result Value Ref Range   aPTT >200 (HH) 24 - 37 seconds    Comment:        IF BASELINE aPTT IS ELEVATED, SUGGEST PATIENT RISK ASSESSMENT BE USED TO DETERMINE APPROPRIATE ANTICOAGULANT THERAPY. REPEATED TO VERIFY CRITICAL RESULT CALLED TO, READ BACK BY AND VERIFIED WITH: DOROTHU [facility] RN AT 1007 09/12/15 BY Karie Chimera   APTT     Status: Abnormal   Collection Time: 09/12/15 11:17 AM  Result Value Ref Range   aPTT 133 (H) 24 - 37 seconds    Comment:        IF BASELINE aPTT IS ELEVATED, SUGGEST PATIENT RISK ASSESSMENT BE USED TO DETERMINE APPROPRIATE ANTICOAGULANT THERAPY.   APTT     Status: Abnormal   Collection Time: 09/12/15  3:10 PM  Result Value Ref Range   aPTT 110 (H) 24 - 37 seconds    Comment:        IF BASELINE aPTT IS ELEVATED, SUGGEST PATIENT RISK ASSESSMENT BE USED TO DETERMINE APPROPRIATE ANTICOAGULANT THERAPY.   Rapid HIV screen (HIV 1/2 Ab+Ag)     Status: None   Collection Time: 09/12/15  3:10 PM  Result Value Ref Range   HIV-1 P24 Antigen - HIV24 NON REACTIVE NON REACTIVE   HIV 1/2 Antibodies NON REACTIVE NON REACTIVE   Interpretation (HIV Ag Ab)      A non reactive test result means  that HIV 1 or HIV 2 antibodies and HIV 1 p24 antigen were not detected in the specimen.  Glucose, capillary     Status: Abnormal   Collection Time: 09/12/15  4:26 PM  Result Value Ref Range   Glucose-Capillary 108 (H) 65 - 99 mg/dL  Glucose, capillary     Status: Abnormal   Collection Time: 09/12/15  7:27 PM  Result Value Ref Range   Glucose-Capillary 119 (H) 65 - 99 mg/dL   Comment 1 Notify RN   Glucose, capillary     Status: Abnormal   Collection Time: 09/12/15 11:53 PM  Result Value Ref Range   Glucose-Capillary 191 (H) 65 - 99 mg/dL   Comment 1 Notify RN   CBC     Status: Abnormal   Collection Time: 09/13/15  2:32 AM  Result Value Ref Range   WBC 8.6 4.0 - 10.5 K/uL   RBC 4.21 3.87 - 5.11 MIL/uL   Hemoglobin 9.7 (L) 12.0 - 15.0 g/dL   HCT 31.2 (L) 36.0 - 46.0 %   MCV 74.1 (L) 78.0 - 100.0 fL   MCH 23.0 (L) 26.0 - 34.0 pg   MCHC 31.1 30.0 - 36.0 g/dL   RDW 17.7 (H) 11.5 - 15.5 %   Platelets 77 (L) 150 - 400 K/uL    Comment: CONSISTENT WITH PREVIOUS RESULT  Magnesium     Status: Abnormal   Collection Time: 09/13/15  2:32 AM  Result Value Ref Range   Magnesium 1.6 (L) 1.7 - 2.4 mg/dL  Comprehensive metabolic panel     Status: Abnormal   Collection Time: 09/13/15  2:32 AM  Result Value Ref Range   Sodium 140 135 - 145 mmol/L   Potassium 3.7 3.5 - 5.1 mmol/L   Chloride 109 101 - 111 mmol/L   CO2 22 22 - 32 mmol/L   Glucose, Bld 131 (H) 65 - 99 mg/dL   BUN 16 6 - 20 mg/dL   Creatinine, Ser 0.90 0.44 - 1.00 mg/dL   Calcium 7.8 (L) 8.9 -  10.3 mg/dL   Total Protein 6.2 (L) 6.5 - 8.1 g/dL   Albumin 2.1 (L) 3.5 - 5.0 g/dL   AST 12 (L) 15 - 41 U/L   ALT 12 (L) 14 - 54 U/L   Alkaline Phosphatase 59 38 - 126 U/L   Total Bilirubin 0.6 0.3 - 1.2 mg/dL   GFR calc non Af Amer >60 >60 mL/min   GFR calc Af Amer >60 >60 mL/min    Comment: (NOTE) The eGFR has been calculated using the CKD EPI equation. This calculation has not been validated in all clinical situations. eGFR's  persistently <60 mL/min signify possible Chronic Kidney Disease.    Anion gap 9 5 - 15  Protime-INR     Status: Abnormal   Collection Time: 09/13/15  2:32 AM  Result Value Ref Range   Prothrombin Time 16.5 (H) 11.6 - 15.2 seconds   INR 1.32 0.00 - 1.49  Glucose, capillary     Status: Abnormal   Collection Time: 09/13/15  4:08 AM  Result Value Ref Range   Glucose-Capillary 114 (H) 65 - 99 mg/dL   Comment 1 Notify RN     Current Facility-Administered Medications  Medication Dose Route Frequency Provider Last Rate Last Dose  . acetaminophen (TYLENOL) tablet 650 mg  650 mg Oral Q6H PRN Ilene Qua Opyd, MD      . bisacodyl (DULCOLAX) EC tablet 5 mg  5 mg Oral Daily PRN Vianne Bulls, MD      . Chlorhexidine Gluconate Cloth 2 % PADS 6 each  6 each Topical Q0600 Rush Farmer, MD   6 each at 09/13/15 0532  . clonazePAM (KLONOPIN) tablet 0.5 mg  0.5 mg Oral Daily Dory Horn, NP   0.5 mg at 09/13/15 1049  . furosemide (LASIX) injection 40 mg  40 mg Intravenous Q12H Nita Sells, MD   40 mg at 09/13/15 1049  . gentamicin (GARAMYCIN) 70 mg in dextrose 5 % 50 mL IVPB  70 mg Intravenous [address] RPH   70 mg at 09/13/15 1049  . magnesium oxide (MAG-OX) tablet 400 mg  400 mg Oral Daily Nita Sells, MD   400 mg at 09/13/15 1133  . methocarbamol (ROBAXIN) tablet 500 mg  500 mg Oral Q6H PRN Nita Sells, MD      . morphine (MS CONTIN) 12 hr tablet 30 mg  30 mg Oral Q12H Nita Sells, MD   30 mg at 09/13/15 1049  . mupirocin ointment (BACTROBAN) 2 % 1 application  1 application Nasal BID Rush Farmer, MD   1 application at 27/74/12 1049  . oxyCODONE-acetaminophen (PERCOCET/ROXICET) 5-325 MG per tablet 1 tablet  1 tablet Oral Q4H PRN Nita Sells, MD      . polyethylene glycol (MIRALAX / GLYCOLAX) packet 17 g  17 g Oral Daily PRN Ilene Qua Opyd, MD      . potassium chloride SA (K-DUR,KLOR-CON) CR tablet 40 mEq  40 mEq Oral BID Nita Sells, MD   40 mEq at 09/13/15 1049  . rifampin (RIFADIN) 300 mg in sodium chloride 0.9 % 100 mL IVPB  300 mg Intravenous 3 times per day Truman Hayward, MD   300 mg at 09/13/15 0530  . sodium chloride flush (NS) 0.9 % injection 3 mL  3 mL Intravenous Q12H Ilene Qua Opyd, MD   3 mL at 09/13/15 1100  . vancomycin (VANCOCIN) IVPB 1000 mg/200 mL premix  1,000 mg Intravenous Q8H Erenest Blank, [facility]  200 mL/hr at 09/13/15 0818 1,000 mg at 09/13/15 0818    Musculoskeletal: Strength & Muscle Tone: within normal limits Gait & Station: unable to stand Patient leans: N/A  Psychiatric Specialty Exam: ROS complaining of chest pain and shortness of breath. No Fever-chills, No Headache, No changes with Vision or hearing, reports vertigo No problems swallowing food or Liquids, No Chest pain, Cough or Shortness of Breath, No Abdominal pain, No Nausea or Vommitting, Bowel movements are regular, No Blood in stool or Urine, No dysuria, No new skin rashes or bruises, No new joints pains-aches,  No new weakness, tingling, numbness in any extremity, No recent weight gain or loss, No polyuria, polydypsia or polyphagia,   A full 10 point Review of Systems was done, except as stated above, all other Review of Systems were negative.  Blood pressure 123/100, pulse 88, temperature 97.9 F (36.6 C), temperature source Oral, resp. rate 18, height 5' 4"  (1.626 m), weight 73.3 kg (161 lb 9.6 oz), last menstrual period 04/12/2015, SpO2 88 %.Body mass index is 27.72 kg/(m^2).  General Appearance: Guarded  Eye Contact::  Good  Speech:  Clear and Coherent  Volume:  Decreased  Mood:  Anxious and Depressed  Affect:  Constricted and Depressed  Thought Process:  Coherent and Goal Directed  Orientation:  Full (Time, Place, and Person)  Thought Content:  WDL  Suicidal Thoughts:  No  Homicidal Thoughts:  No  Memory:  Immediate;   Good Recent;   Fair Remote;   Fair  Judgement:  Impaired  Insight:  Fair   Psychomotor Activity:  Restlessness  Concentration:  Fair  Recall:  [employer] of Knowledge:Good  Language: Good  Akathisia:  Negative  Handed:  Right  AIMS (if indicated):     Assets:  Communication Skills Desire for Improvement Financial Resources/Insurance Housing Intimacy Leisure Time Resilience Social Support Transportation  ADL's:  Impaired  Cognition: WNL  Sleep:      Treatment Plan Summary: Patient has capacity to make her own medical decisions and living arrangements based on my evaluation today. Monitor for opioid withdrawal symptoms and CIWA protocol May benefit from clonidine 0.1 mg twice daily for anxiety and possible withdrawal symptoms Patient will be referred to the residential substance abuse rehabilitation program and medically stable  Referred to the unit social work regarding rehabilitation placement   Disposition: Recommended residential substance abuse rehabilitation program at [facility] recovery services when medically stable. Supportive therapy provided about ongoing stressors.  Durward Parcel., MD 09/13/2015 11:48 AM

## 2015-09-13 NOTE — Progress Notes (Signed)
eLink Physician-Brief Progress Note Patient Name: [name] DOB: 02/15/1976 MRN: [id number]   Date of Service  09/13/2015  HPI/Events of Note  Rn concerned about low RR on pca  eICU Interventions  Reduced bolus and max pca Camera in: appears good Awake eating a strawberry icey      Intervention Category Major Interventions: Infection - evaluation and management;Respiratory failure - evaluation and management  FEINSTEIN,DANIEL J. 09/13/2015, 12:12 AM

## 2015-09-13 NOTE — Progress Notes (Addendum)
Pharmacy Antibiotic Note  Melanie CivatteJoann Verdie Shireheresa Crane is a 40 y.o. 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  Pharmacy has been consulted for gentamicin dosing.   Plan: Gentamicin IV 70mg  q8h Continue Vancomycin, discontinue cefazolin Gent trough with 4th dose  F/u renal function, LOT, C&S  Height: 5\' 4"  (162.6 cm) Weight: 161 lb 9.6 oz (73.3 kg) IBW/kg (Calculated) : 54.7  Temp (24hrs), Avg:98.4 F (36.9 C), Min:97.9 F (36.6 C), Max:98.6 F (37 C)   Recent Labs Lab 09/10/15 2220  09/11/15 0625 09/11/15 0948 09/11/15 1715 09/11/15 1730 09/11/15 2106 09/12/15 0130 09/13/15 0232  WBC 6.0  --   --   --  10.1  --   --  6.5 8.6  CREATININE 0.79  --   --   --  0.97  --   --  0.90 0.90  LATICACIDVEN  --   < > 2.1* 2.0 1.3 1.22 2.1*  --   --   < > = values in this interval not displayed.  Estimated Creatinine Clearance: 82.3 mL/min (by C-G formula based on Cr of 0.9).    No Known Allergies  Antimicrobials this admission: Natasha BenceGent 4/3>> Rifampin 4/2>> Cefazolin 4/2>>04/03 Vanc 4/1>> Zosyn 4/1>>4/2 Cefepime 4/1 x 1 dose  Microbiology results: 4/1 Blood - GPC - speciation MRSA 4/1 MRSA - POS  Thank you for allowing pharmacy to be a part of this patient's care.  [place] PinkGazda, Nitzia Perren P, PharmD 09/13/2015 9:18 AM

## 2015-09-14 DIAGNOSIS — Z7189 Other specified counseling: Secondary | ICD-10-CM

## 2015-09-14 DIAGNOSIS — F1910 Other psychoactive substance abuse, uncomplicated: Secondary | ICD-10-CM

## 2015-09-14 DIAGNOSIS — B182 Chronic viral hepatitis C: Secondary | ICD-10-CM

## 2015-09-14 LAB — COMPREHENSIVE METABOLIC PANEL
ALT: 10 U/L — AB (ref 14–54)
AST: 21 U/L (ref 15–41)
Albumin: 2.2 g/dL — ABNORMAL LOW (ref 3.5–5.0)
Alkaline Phosphatase: 69 U/L (ref 38–126)
Anion gap: 13 (ref 5–15)
BUN: 12 mg/dL (ref 6–20)
CHLORIDE: 105 mmol/L (ref 101–111)
CO2: 19 mmol/L — AB (ref 22–32)
CREATININE: 0.9 mg/dL (ref 0.44–1.00)
Calcium: 7.9 mg/dL — ABNORMAL LOW (ref 8.9–10.3)
GFR calc Af Amer: >60 mL/min (ref >60)
GFR calc non Af Amer: >60 mL/min (ref >60)
GLUCOSE: 216 mg/dL — AB (ref 65–99)
Potassium: 3.5 mmol/L (ref 3.5–5.1)
SODIUM: 137 mmol/L (ref 135–145)
Total Bilirubin: 0.7 mg/dL (ref 0.3–1.2)
Total Protein: 6.9 g/dL (ref 6.5–8.1)

## 2015-09-14 LAB — CBC
HCT: 33.9 % — ABNORMAL LOW (ref 36.0–46.0)
Hemoglobin: 10.5 g/dL — ABNORMAL LOW (ref 12.0–15.0)
MCH: 23.1 pg — ABNORMAL LOW (ref 26.0–34.0)
MCHC: 31 g/dL (ref 30.0–36.0)
MCV: 74.7 fL — ABNORMAL LOW (ref 78.0–100.0)
PLATELETS: 111 10*3/uL — AB (ref 150–400)
RBC: 4.54 MIL/uL (ref 3.87–5.11)
RDW: 17.4 % — ABNORMAL HIGH (ref 11.5–15.5)
WBC: 7.7 10*3/uL (ref 4.0–10.5)

## 2015-09-14 LAB — HCG, QUANTITATIVE, PREGNANCY

## 2015-09-14 LAB — GENTAMICIN LEVEL, TROUGH: Gentamicin Trough: 0.7 ug/mL (ref 0.5–2.0)

## 2015-09-14 MED ORDER — POTASSIUM CHLORIDE CRYS ER 20 MEQ PO TBCR
40.0000 meq | EXTENDED_RELEASE_TABLET | Freq: Three times a day (TID) | ORAL | Status: DC
  Administered 2015-09-14 – 2015-09-15 (×4): 40 meq via ORAL
  Filled 2015-09-14 (×5): qty 2

## 2015-09-14 MED ORDER — OXYCODONE-ACETAMINOPHEN 5-325 MG PO TABS
1.0000 | ORAL_TABLET | ORAL | Status: DC | PRN
  Administered 2015-09-14 – 2015-09-15 (×6): 2 via ORAL
  Filled 2015-09-14 (×6): qty 2

## 2015-09-14 MED ORDER — CHLORDIAZEPOXIDE HCL 10 MG PO CAPS
10.0000 mg | ORAL_CAPSULE | Freq: Once | ORAL | Status: AC
  Administered 2015-09-14: 10 mg via ORAL
  Filled 2015-09-14: qty 1

## 2015-09-14 MED ORDER — DIPHENOXYLATE-ATROPINE 2.5-0.025 MG PO TABS: 1.0000 | ORAL_TABLET | Freq: Four times a day (QID) | ORAL | Status: DC | PRN

## 2015-09-14 NOTE — Progress Notes (Signed)
Sandi RavelingJoann Theresa Heinsohn ZOX:[id number] DOB: 10/18/1975 DOA: 09/10/2015 PCP: No PCP Per Patient  Brief narrative:  40 y.o. 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 Greggory StallionGeorge Comas [facility], Dr Othelia Pullingajendra Sharma-Cards]  chronic anemia,  hepatitis C +  smoker Note admitted 4/5-4/26/16-->cavitary lung lesions on CT chest --gram neg bacteremia[see care everywhere 10/06/14] At that admit DVT RUE [provoked-PICC line related]-LUPUS AC +?--advised lifelong AC??--Left AMA that admission who presents to the ED with chest pain, fevers, and malaise.   Patient reports that she had just been [place], but left AGAINST MEDICAL ADVICE due to disagreement with her treatment plan  Admitted to SDU   Past medical history-As per Problem list Chart reviewed as below-   Consultants:  ID  PCCM  Procedures:    Antibiotics:  Cefepime  vanc  rifampin   Subjective   Fair  Less sob Less WOB Diarrhea+ No blurred nor double vision Doesn't want the lasix Emphatically states " i wasn't trying to kill myself"   Objective    Interim Hist and :   Telemetry: sinus   Objective: Filed Vitals:   09/14/15 0300 09/14/15 0355 09/14/15 0400 09/14/15 0800  BP:    135/93  Pulse: 87  89 82  Temp:      TempSrc:      Resp: 21  26 21   Height:      Weight:  71 kg (156 lb 8.4 oz)    SpO2: 92%  91% 94%    Intake/Output Summary (Last 24 hours) at 09/14/15 1010 Last data filed at 09/14/15 0400  Gross per 24 hour  Intake 815.25 ml  Output   2475 ml  Net -1659.75 ml    Exam:  General: Alert oriented pupils are normal, no pallor no icterus No throat injection dentition is poor has a plate at the upper mouth moderate dentition lower mouth. Cardiovascular: s1 s 2no m/r/g Respiratory: clear no added sound, no tvr,no tvf Abdomen: soft nt nd no rebound no gaurd Skin no le  edema  Data Reviewed: Basic Metabolic Panel:  Recent Labs Lab 09/10/15 2220 09/11/15 0626 09/11/15 1715 09/12/15 0130 09/13/15 0232  NA 139  --  140 139 140  K 3.2*  --  3.2* 3.2* 3.7  CL 113*  --  114* 110 109  CO2 16*  --  15* 18* 22  GLUCOSE 128*  --  106* 157* 131*  BUN 27*  --  19 18 16   CREATININE 0.79  --  0.97 0.90 0.90  CALCIUM 8.0* 7.4* 7.6* 7.6* 7.8*  MG  --  1.5*  --  1.8 1.6*  PHOS  --   --   --  3.0  --    Liver Function Tests:  Recent Labs Lab 09/11/15 1715 09/13/15 0232  AST 21 12*  ALT 17 12*  ALKPHOS 72 59  BILITOT 0.9 0.6  PROT 6.1* 6.2*  ALBUMIN 2.2* 2.1*   No results for input(s): LIPASE, AMYLASE in the last 168 hours. No results for input(s): AMMONIA in the last 168 hours. CBC:  Recent Labs Lab 09/10/15 2220 09/11/15 1715 09/12/15 0130 09/13/15 0232  WBC 6.0 10.1 6.5 8.6  NEUTROABS  --  7.7  --   --   HGB 10.8* 10.4* 9.4* 9.7*  HCT 34.6* 32.0* 30.1* 31.2*  MCV 74.1* 73.4* 73.2* 74.1*  PLT 44* 53* 40* 77*   Cardiac  Enzymes:  Recent Labs Lab 09/10/15 2220 09/11/15 0626 09/11/15 1129 09/11/15 1715  TROPONINI 0.19* 0.03 0.03 0.14*   BNP: Invalid input(s): POCBNP CBG:  Recent Labs Lab 09/12/15 0405 09/12/15 1626 09/12/15 1927 09/12/15 2353 09/13/15 0408  GLUCAP 143* 108* 119* 191* 114*    Recent Results (from the past 240 hour(s))  Blood culture (routine x 2)     Status: None (Preliminary result)   Collection Time: 09/10/15 11:50 PM  Result Value Ref Range Status   Specimen Description BLOOD RIGHT ARM  Final   Special Requests BOTTLES DRAWN AEROBIC AND ANAEROBIC  Final   Culture NO GROWTH 2 DAYS  Final   Report Status PENDING  Incomplete  Blood culture (routine x 2)     Status: None (Preliminary result)   Collection Time: 09/10/15 11:55 PM  Result Value Ref Range Status   Specimen Description BLOOD RIGHT HAND  Final   Special Requests BOTTLES DRAWN AEROBIC AND ANAEROBIC  Final   Culture NO GROWTH 2 DAYS   Final   Report Status PENDING  Incomplete  Culture, blood (single) w Reflex to ID Panel     Status: None   Collection Time: 09/11/15 12:40 AM  Result Value Ref Range Status   Specimen Description BLOOD RIGHT ARM  Final   Special Requests IN PEDIATRIC BOTTLE  Final   Culture  Setup Time   Final    GRAM POSITIVE COCCI IN CLUSTERS PEDIATRIC BOTTLE CALLED TO FLYNT,F RN 09/11/15 2046 WOOTEN,K CONFIRMED BY V WILKINS    Culture METHICILLIN RESISTANT STAPHYLOCOCCUS AUREUS  Final   Report Status 09/13/2015 FINAL  Final   Organism ID, Bacteria METHICILLIN RESISTANT STAPHYLOCOCCUS AUREUS  Final      Susceptibility   Methicillin resistant staphylococcus aureus - MIC*    CIPROFLOXACIN >=8 RESISTANT Resistant     ERYTHROMYCIN >=8 RESISTANT Resistant     GENTAMICIN <=0.5 SENSITIVE Sensitive     OXACILLIN >=4 RESISTANT Resistant     TETRACYCLINE <=1 SENSITIVE Sensitive     VANCOMYCIN 1 SENSITIVE Sensitive     TRIMETH/SULFA <=10 SENSITIVE Sensitive     CLINDAMYCIN <=0.25 SENSITIVE Sensitive     RIFAMPIN <=0.5 SENSITIVE Sensitive     Inducible Clindamycin NEGATIVE Sensitive     * METHICILLIN RESISTANT STAPHYLOCOCCUS AUREUS  MRSA PCR Screening     Status: Abnormal   Collection Time: 09/11/15  6:54 PM  Result Value Ref Range Status   MRSA by PCR POSITIVE (A) NEGATIVE Final    Comment:        The GeneXpert MRSA Assay (FDA approved for NASAL specimens only), is one component of a comprehensive MRSA colonization surveillance program. It is not intended to diagnose MRSA infection nor to guide or monitor treatment for MRSA infections. RESULT CALLED TO, READ BACK BY AND VERIFIED WITH: T.CRITE,RN 09/11/15  BY V.WILKINS      Studies:              All Imaging reviewed and is as per above notation   Scheduled Meds: . Chlorhexidine Gluconate Cloth  6 each Topical Q0600  . clonazePAM  0.5 mg Oral Daily  . cloNIDine  0.1 mg Oral BID  . furosemide  40 mg Intravenous Q12H  . gentamicin   70 mg Intravenous Q8H  . magnesium oxide  400 mg Oral Daily  . morphine  30 mg Oral Q12H  . mupirocin ointment  1 application Nasal BID  . potassium chloride  40 mEq Oral TID  .  rifampin (RIFADIN) IVPB  300 mg Intravenous 3 times per day  . sodium chloride flush  3 mL Intravenous Q12H  . vancomycin  1,250 mg Intravenous Q12H   Continuous Infusions:     Assessment/Plan: 1.  Severe sepsis with source IV drug abuse and . Very likely endocarditis-cardiology to be consult it as May need TEE-TTE report is pending as of 4/2 AM . Continue empiric vancomycin and Zosyn, Gentamicin-appreciate ID input. Transition off hydrocortisone stress dose steroids 50 every 6-->off 09/12/2015--high risk patient for Cardiac surgery. 2. Opiate withdrawal-no escalation beyond today's dosing of MS contin 30 bid, Percocet 5/325 1-2 tabs-NO FU\RTHER ESCALATION-WILL NOT D/C ON OPIATES--THIS HAS BEEN MADE CLEAR TO PATIENT--transfer to Med-surg 3. Acute respiratory failure and Mixed metabolic acidosis-initially on bicarbonate gtt. 3 ampules because of severe acidosis likely secondary to sepsis--resolved as of 09/13/15-- potential septic emboli/ potential pneumonia-->rec'ds initially IV Lasix 40 q 12 --change to lasix po 40 bid 09/13/15-->off 09/14/15.  Labs from this am Pending-needed foot stick. 4. Septic emboli-? History of lupus anticoagulant--d/c Argatroban as per discussion Dr. Candise Che 09/12/15.  Needs rpt Anti-phospholipid AB 12 weeks from time of Rx of Endocarditis 5. History hepatitis C- Hep C is +-diagnosed August 2015- viral loads 93, 436-- ID to comment.  HIV is negative. 6. Severe TCP-2/2 to sepsis-improving significantly-should get better in time-PLT count 04/2015 were in the  200 range 7. History of incarceration for 5 years-see above discussion 8. chronic pain- I have explained to her that cross tolerance occurs and that we will not get her pain controlled/withdrawal symptoms controlled 100%-she clearly understands this.  Suggested to her Subitx/methadone maintenance as OP-SOCIAL WORKER TO GIVE HER IDEAS AS TO WHERE TO GET THIS METHADONE MAINTENANCE 9. Hypokalemia-replace orally-she is eating-Kdur 40 bid.  Labs am Pending 10. Smoker-offer patch   Global-patient is improved-trasnfer to med-surg 09/14/15 Main issues now are Rx Endocarditis Appreciate CCM and Pallaitive care input Overall stable  >25 minutes coordinating this patient's care this morning Full code     Pleas Koch, MD  [facility] Pager [phone] 09/14/2015, 10:10 AM    LOS: 3 days

## 2015-09-14 NOTE — Progress Notes (Signed)
Patient IVC'd by Dr. Toniann FailKakrakandy as patient voiced "I want to die" She vehemently denies this  She tells me that she " [place] heart surgeon Dr. Narda Amberomas will operate on me"  This statement is difficult to comprehend, given the fact that we do not have TEE as yet to determine which valves are involved nor can we trust the veracity of her statement regarding getting Dr. Narda Amberomas available to see her on such short notice!  She remains IVC'd until Dr. Carmelina DaneJonnalagada can assess clearly her intent to do self-harm  She has the right to refuse TEE  Her prognosis if she is cleared from psych perpective for d/c [which would be AMA] remians extremely guarded  I have told her she will not survive to the age of 40 if she leaves the hospital without appropriate medical attention and cessation of IV drugs.   Pleas KochJai Venancio Chenier, MD [facility] ([phone]) [phone]   >6o minutes total time today >50% today was face to face time

## 2015-09-14 NOTE — Progress Notes (Signed)
CSW discussed case with Dr. Mahala MenghiniSamtani.  Plan is to continue IVC this pm until psych can evaluate on 09/14/15.  [facility]/s [facility].  Pollyann SavoyJody Patricio Popwell, LCSW Evening/ED Coverage [phone]

## 2015-09-14 NOTE — Progress Notes (Signed)
Patient requesting discharge papers. Explained IVC papers to patient. MD notified. Mother and sitter at bedside

## 2015-09-14 NOTE — Progress Notes (Signed)
Pharmacy Antibiotic Note Melanie CivatteJoann Verdie Shireheresa Crane is a 40 y.o. female admitted on 09/10/2015 MRSA bacteremia in setting of recurrent prosthetic valve endocarditis. Gentamicin trough level today is 0.7 (goal <1)    Plan: Continue gentimicin 70mg  q8h  Continue vancomycin 1250mg  IV every 12 hours.    Height: 5\' 4"  (162.6 cm) Weight: 156 lb 8.4 oz (71 kg) IBW/kg (Calculated) : 54.7  Temp (24hrs), Avg:100.3 F (37.9 C), Min:100.3 F (37.9 C), Max:100.3 F (37.9 C)   Recent Labs Lab 09/10/15 2220  09/11/15 0625 09/11/15 0948 09/11/15 1715 09/11/15 1730 09/11/15 2106 09/12/15 0130 09/13/15 0232 09/13/15 1619 09/14/15 1009  WBC 6.0  --   --   --  10.1  --   --  6.5 8.6  --  7.7  CREATININE 0.79  --   --   --  0.97  --   --  0.90 0.90  --  0.90  LATICACIDVEN  --   < > 2.1* 2.0 1.3 1.22 2.1*  --   --   --   --   VANCOTROUGH  --   --   --   --   --   --   --   --   --  2429*  --   GENTTROUGH  --   --   --   --   --   --   --   --   --   --  0.7  < > = values in this interval not displayed.  Estimated Creatinine Clearance: 81.1 mL/min (by C-G formula based on Cr of 0.9).    No Known Allergies  Antimicrobials this admission: Vanc 4/1>> Rifampin 4/2 >> Gentamycin 4/3 >>   Cefazolin 4/2>> 4/3 Zosyn 4/1>>4/2 Cefepime 4/1 x 1 dose  Pertinent levels this admission: 4/3 VT 29 on 1 gram q8h and SCr 0.91 4/4 Gentamicin trough 0.7  On 70mg  IV q8h- continue    Microbiology results: 4/3 BCx: px 4/1 Blood - MRSA 4/1 MRSA - NEG 3/31 BCx: ngtd  Thank you for allowing pharmacy to be a part of this patient's care.  Link SnufferJessica Wayne Wicklund, PharmD, BCPS Clinical Pharmacist [phone] 09/14/2015, 2:13 PM

## 2015-09-14 NOTE — Progress Notes (Signed)
NURSING PROGRESS NOTE  Melanie Crane [id number] Admission Data: 09/14/2015 7:00 PM Attending Provider: Rhetta MuraJai-Gurmukh Samtani, MD PCP:No PCP Per Patient Code Status: FULL  Melanie Crane is a 40 y.o. female patient transferred from Stepdown:  -No acute distress noted.  -No complaints of shortness of breath.  -No complaints of chest pain.   Cardiac Monitoring: None  Blood pressure 123/86, pulse 92, temperature 97.6 F (36.4 C), temperature source Oral, resp. rate 18, height 5\' 4"  (1.626 m), weight 70.9 kg (156 lb 4.9 oz), last menstrual period 04/12/2015, SpO2 95 %.   IV Fluids:  IV in place, occlusive dsg intact without redness, IV cath forearm left, condition patent and no redness normal saline.   Allergies:  Review of patient's allergies indicates no known allergies.  Past Medical History:   has a past medical history of Anemia; IVDU (intravenous drug user); Acute bacterial endocarditis - right sided (12/29/2013); Hepatitis C; and Miscarriage.  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  Social History:   reports that she has been smoking Cigarettes.  She has never used smokeless tobacco. She reports that she uses illicit drugs (IV). She reports that she does not drink alcohol.   Patient/Family orientated to room. Information packet given to patient/family. Admission inpatient armband information verified with patient/family to include name and date of birth and placed on patient arm. Side rails up x 2, fall assessment and education completed with patient/family. Patient/family able to verbalize understanding of risk associated with falls and verbalized understanding to call for assistance before getting out of bed. Call light within reach. Patient/family able to voice and demonstrate understanding of unit orientation instructions.    Will  continue to evaluate and treat per MD orders.  Melanie Pieriniyndi Braeden Dolinski, RN

## 2015-09-14 NOTE — Progress Notes (Addendum)
Subjective:  Patient states she still feels miserable despite appearing comfortable. She tells me she wants to leave the hospital and board a plane for [place] so she can be seen by the doctors there who she is sure will perform open heart surgery to replace her heart valves  Antibiotics:  Anti-infectives    Start     Dose/Rate Route Frequency Ordered Stop   09/14/15 0400  vancomycin (VANCOCIN) 1,250 mg in sodium chloride 0.9 % 250 mL IVPB     1,250 mg 166.7 mL/hr over 90 Minutes Intravenous Every 12 hours 09/13/15 1902     09/13/15 1000  gentamicin (GARAMYCIN) 70 mg in dextrose 5 % 50 mL IVPB     70 mg 103.5 mL/hr over 30 Minutes Intravenous Every 8 hours 09/13/15 0938     09/12/15 1415  rifampin (RIFADIN) 300 mg in sodium chloride 0.9 % 100 mL IVPB     300 mg 200 mL/hr over 30 Minutes Intravenous 3 times per day 09/12/15 1408     09/12/15 1100  ceFAZolin (ANCEF) IVPB 2g/100 mL premix  Status:  Discontinued     2 g 200 mL/hr over 30 Minutes Intravenous Every 8 hours 09/12/15 0957 09/13/15 0907   09/11/15 0900  vancomycin (VANCOCIN) IVPB 1000 mg/200 mL premix  Status:  Discontinued     1,000 mg 200 mL/hr over 60 Minutes Intravenous Every 8 hours 09/11/15 0644 09/13/15 1841   09/11/15 0645  piperacillin-tazobactam (ZOSYN) IVPB 3.375 g  Status:  Discontinued     3.375 g 12.5 mL/hr over 240 Minutes Intravenous 3 times per day 09/11/15 0644 09/12/15 0946   09/11/15 0100  ceFEPIme (MAXIPIME) 2 g in dextrose 5 % 50 mL IVPB     2 g 100 mL/hr over 30 Minutes Intravenous  Once 09/11/15 0056 09/11/15 0352   09/11/15 0045  vancomycin (VANCOCIN) IVPB 1000 mg/200 mL premix     1,000 mg 200 mL/hr over 60 Minutes Intravenous  Once 09/11/15 0040 09/11/15 0207      Medications: Scheduled Meds: . Chlorhexidine Gluconate Cloth  6 each Topical Q0600  . clonazePAM  0.5 mg Oral Daily  . cloNIDine  0.1 mg Oral BID  . gentamicin  70 mg Intravenous Q8H  . magnesium oxide  400 mg Oral  Daily  . morphine  30 mg Oral Q12H  . mupirocin ointment  1 application Nasal BID  . potassium chloride  40 mEq Oral TID  . rifampin (RIFADIN) IVPB  300 mg Intravenous 3 times per day  . sodium chloride flush  3 mL Intravenous Q12H  . vancomycin  1,250 mg Intravenous Q12H   Continuous Infusions:  PRN Meds:.acetaminophen **OR** [DISCONTINUED] acetaminophen, bisacodyl, diphenoxylate-atropine, methocarbamol, oxyCODONE-acetaminophen, polyethylene glycol    Objective: Weight change: -5 lb 1.1 oz (-2.3 kg)  Intake/Output Summary (Last 24 hours) at 09/14/15 1643 Last data filed at 09/14/15 1050  Gross per 24 hour  Intake 715.25 ml  Output    675 ml  Net  40.25 ml   Blood pressure 135/93, pulse 82, temperature 100.3 F (37.9 C), temperature source Oral, resp. rate 21, height 5\' 4"  (1.626 m), weight 156 lb 8.4 oz (71 kg), last menstrual period 04/12/2015, SpO2 94 %. Pulse Rate:  [82-101] 82 (04/04 0800) Resp:  [21-31] 21 (04/04 0800) BP: (104-135)/(82-93) 135/93 mmHg (04/04 0800) SpO2:  [90 %-94 %] 94 % (04/04 0800) Weight:  [156 lb 8.4 oz (71 kg)] 156 lb 8.4 oz (71 kg) (04/04 0355)  Physical Exam: General: aox3 HEENT: anicteric sclera, EOMI, oropharynx clear and without exudate Cardiovascular: regular rate, normal r, i could not hear murmur with contact precaution stethoscope Pulmonary: Fairly clear Gastrointestinal: soft nontender, nondistended, normal bowel sounds, Musculoskeletal: no clubbing or edema noted bilaterally Skin, soft tissue: Multiple areas where she has injected intravenous drugs no obvious abscess Neuro: nonfocal, strength and sensation intact  CBC:  CBC Latest Ref Rng 09/14/2015 09/13/2015 09/12/2015  WBC 4.0 - 10.5 K/uL 7.7 8.6 6.5  Hemoglobin 12.0 - 15.0 g/dL 10.5(L) 9.7(L) 9.4(L)  Hematocrit 36.0 - 46.0 % 33.9(L) 31.2(L) 30.1(L)  Platelets 150 - 400 K/uL 111(L) 77(L) 40(L)       BMET  Recent Labs  09/13/15 0232 09/14/15 1009  NA 140 137  K 3.7 3.5   CL 109 105  CO2 22 19*  GLUCOSE 131* 216*  BUN 16 12  CREATININE 0.90 0.90  CALCIUM 7.8* 7.9*     Liver Panel   Recent Labs  09/13/15 0232 09/14/15 1009  PROT 6.2* 6.9  ALBUMIN 2.1* 2.2*  AST 12* 21  ALT 12* 10*  ALKPHOS 59 69  BILITOT 0.6 0.7       Sedimentation Rate No results for input(s): ESRSEDRATE in the last 72 hours. C-Reactive Protein No results for input(s): CRP in the last 72 hours.  Micro Results: Recent Results (from the past 720 hour(s))  Blood culture (routine x 2)     Status: None (Preliminary result)   Collection Time: 09/10/15 11:50 PM  Result Value Ref Range Status   Specimen Description BLOOD RIGHT ARM  Final   Special Requests BOTTLES DRAWN AEROBIC AND ANAEROBIC  Final   Culture NO GROWTH 3 DAYS  Final   Report Status PENDING  Incomplete  Blood culture (routine x 2)     Status: None (Preliminary result)   Collection Time: 09/10/15 11:55 PM  Result Value Ref Range Status   Specimen Description BLOOD RIGHT HAND  Final   Special Requests BOTTLES DRAWN AEROBIC AND ANAEROBIC  Final   Culture NO GROWTH 3 DAYS  Final   Report Status PENDING  Incomplete  Culture, blood (single) w Reflex to ID Panel     Status: None   Collection Time: 09/11/15 12:40 AM  Result Value Ref Range Status   Specimen Description BLOOD RIGHT ARM  Final   Special Requests IN PEDIATRIC BOTTLE  Final   Culture  Setup Time   Final    GRAM POSITIVE COCCI IN CLUSTERS PEDIATRIC BOTTLE CALLED TO FLYNT,F RN 09/11/15 2046 WOOTEN,K CONFIRMED BY V WILKINS    Culture METHICILLIN RESISTANT STAPHYLOCOCCUS AUREUS  Final   Report Status 09/13/2015 FINAL  Final   Organism ID, Bacteria METHICILLIN RESISTANT STAPHYLOCOCCUS AUREUS  Final      Susceptibility   Methicillin resistant staphylococcus aureus - MIC*    CIPROFLOXACIN >=8 RESISTANT Resistant     ERYTHROMYCIN >=8 RESISTANT Resistant     GENTAMICIN <=0.5 SENSITIVE Sensitive     OXACILLIN >=4 RESISTANT Resistant      TETRACYCLINE <=1 SENSITIVE Sensitive     VANCOMYCIN 1 SENSITIVE Sensitive     TRIMETH/SULFA <=10 SENSITIVE Sensitive     CLINDAMYCIN <=0.25 SENSITIVE Sensitive     RIFAMPIN <=0.5 SENSITIVE Sensitive     Inducible Clindamycin NEGATIVE Sensitive     * METHICILLIN RESISTANT STAPHYLOCOCCUS AUREUS  MRSA PCR Screening     Status: Abnormal   Collection Time: 09/11/15  6:54 PM  Result Value Ref Range Status  MRSA by PCR POSITIVE (A) NEGATIVE Final    Comment:        The GeneXpert MRSA Assay (FDA approved for NASAL specimens only), is one component of a comprehensive MRSA colonization surveillance program. It is not intended to diagnose MRSA infection nor to guide or monitor treatment for MRSA infections. RESULT CALLED TO, READ BACK BY AND VERIFIED WITH: T.CRITE,RN 09/11/15  BY V.WILKINS   Culture, blood (Routine X 2) w Reflex to ID Panel     Status: None (Preliminary result)   Collection Time: 09/13/15  4:05 PM  Result Value Ref Range Status   Specimen Description BLOOD LEFT ANTECUBITAL  Final   Special Requests IN PEDIATRIC BOTTLE 3CC  Final   Culture NO GROWTH < 24 HOURS  Final   Report Status PENDING  Incomplete  Culture, blood (Routine X 2) w Reflex to ID Panel     Status: None (Preliminary result)   Collection Time: 09/13/15  4:15 PM  Result Value Ref Range Status   Specimen Description BLOOD LEFT ANTECUBITAL  Final   Special Requests IN PEDIATRIC BOTTLE 0.5CC  Final   Culture NO GROWTH < 24 HOURS  Final   Report Status PENDING  Incomplete    Studies/Results: No results found.    Assessment/Plan:  INTERVAL HISTORY:   09/11/15: blood cultures still + 09/12/15--09/14/15: patient sp IVC see chart   Principal Problem:   Narcotic dependence (HCC) Active Problems:   IV drug abuse   Anemia   Thrombocytopenia, unspecified (HCC)   Hepatitis C   Hypokalemia   Septic embolism (HCC)   Endocarditis   Prolonged Q-T interval on ECG   Acute endocarditis   Palliative care  encounter   Respiratory failure (HCC)   Endocarditis of native valve   Staphylococcus aureus bacteremia with sepsis (HCC)   Acute renal failure (HCC)   Narcotic abuse   MRSA bacteremia    Melanie Crane is a 40 y.o. 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 Her transthoracic echocardiogram done here at [facility] suggest that she may potentially have a mitral valve vegetation as well.  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 worsening septic emboli to the lungs:  --I discussed with Trish and Carlean Jews PA [facility] and as per PA note pt refused TEE due to need to be NPO, Cardiology would leave option open if patient could be convinced to undergo procedure tomorrow --repeat blood cultures from the third 2017 are no growth in 24 hours --continue vanco/rifampin/gent --she needs to have clearance of blood cultures before placement of PICC or central line  Cardiology is seeing the patient  I do not think she is going to be CT surgery candidate in current circumstances unless her life depends upon it  #2 Persistent ongoing IV drug abuse with now her fourth episode of endocarditis after her heart valve replacement in 2015: I have a high discrete degree of skepticism about her ability to comply with therapy but maybe there is a way going forward greatly appreciate psychiatry's and palliative cares assistance along with coronal care of the primary team  #3 Possible suicidal ideation: pt sp IVC papers. She has TERRIBLE insight into her condition. I explained that in the context of IVC she could not leave the hospital even AGAINST MEDICAL  ADVICE until that issue is resolved.  #4 chronic hepatitis C without hepatic coma: This could be considered as an outpatient if this  patient were ever stable to follow-up in the outpatient world which I sincerely doubt  I spent greater than 35 minutes with the patient including greater than 50% of time in face to face counsel of the patient re her endocarditis, IVDU  and in coordination of her care with primary team and Cardiology.     LOS: 3 days   Acey Lav 09/14/2015, 4:43 PM

## 2015-09-14 NOTE — Progress Notes (Signed)
Mother in to visit patient. Sitter reports that patient is asking mother to bring her medications from home.

## 2015-09-14 NOTE — Progress Notes (Signed)
    I went into room to discuss TEE. Initially patient agreed, but when told her she would be NPO after midnight she asked for sedation. I told her I could not provide pain medications or sedation for hunger discomfort and then she refused the procedure.     Cline CrockHOMPSON, Laekyn Rayos R PA-C 09/14/2015 1:55 PM

## 2015-09-15 ENCOUNTER — Encounter (HOSPITAL_COMMUNITY): Payer: Self-pay | Admitting: General Practice

## 2015-09-15 DIAGNOSIS — E860 Dehydration: Secondary | ICD-10-CM | POA: Insufficient documentation

## 2015-09-15 LAB — HEPATITIS C VRS RNA DETECT BY PCR-QUAL: Hepatitis C Vrs RNA by PCR-Qual: POSITIVE — AB

## 2015-09-15 LAB — COMPREHENSIVE METABOLIC PANEL
ALBUMIN: 2.1 g/dL — AB (ref 3.5–5.0)
ALT: 9 U/L — ABNORMAL LOW (ref 14–54)
ANION GAP: 12 (ref 5–15)
AST: 13 U/L — ABNORMAL LOW (ref 15–41)
Alkaline Phosphatase: 59 U/L (ref 38–126)
BILIRUBIN TOTAL: 0.8 mg/dL (ref 0.3–1.2)
BUN: 7 mg/dL (ref 6–20)
CO2: 20 mmol/L — ABNORMAL LOW (ref 22–32)
Calcium: 8 mg/dL — ABNORMAL LOW (ref 8.9–10.3)
Chloride: 106 mmol/L (ref 101–111)
Creatinine, Ser: 0.71 mg/dL (ref 0.44–1.00)
GFR calc Af Amer: >60 mL/min (ref >60)
GFR calc non Af Amer: >60 mL/min (ref >60)
GLUCOSE: 91 mg/dL (ref 65–99)
POTASSIUM: 5.2 mmol/L — AB (ref 3.5–5.1)
Sodium: 138 mmol/L (ref 135–145)
TOTAL PROTEIN: 6.9 g/dL (ref 6.5–8.1)

## 2015-09-15 LAB — CBC WITH DIFFERENTIAL/PLATELET
BASOS ABS: 0 10*3/uL (ref 0.0–0.1)
Basophils Relative: 0 %
Eosinophils Absolute: 0 10*3/uL (ref 0.0–0.7)
Eosinophils Relative: 0 %
HEMATOCRIT: 30.5 % — AB (ref 36.0–46.0)
Hemoglobin: 9.8 g/dL — ABNORMAL LOW (ref 12.0–15.0)
LYMPHS PCT: 22 %
Lymphs Abs: 1.9 10*3/uL (ref 0.7–4.0)
MCH: 24.3 pg — ABNORMAL LOW (ref 26.0–34.0)
MCHC: 32.1 g/dL (ref 30.0–36.0)
MCV: 75.7 fL — AB (ref 78.0–100.0)
MONO ABS: 0.8 10*3/uL (ref 0.1–1.0)
Monocytes Relative: 9 %
NEUTROS ABS: 6.1 10*3/uL (ref 1.7–7.7)
Neutrophils Relative %: 69 %
Platelets: 141 10*3/uL — ABNORMAL LOW (ref 150–400)
RBC: 4.03 MIL/uL (ref 3.87–5.11)
RDW: 17.8 % — ABNORMAL HIGH (ref 11.5–15.5)
WBC: 8.8 10*3/uL (ref 4.0–10.5)

## 2015-09-15 LAB — PROTIME-INR
INR: 1.35 (ref 0.00–1.49)
Prothrombin Time: 16.8 seconds — ABNORMAL HIGH (ref 11.6–15.2)

## 2015-09-15 MED ORDER — ENOXAPARIN SODIUM 40 MG/0.4ML ~~LOC~~ SOLN
40.0000 mg | SUBCUTANEOUS | Status: DC
  Administered 2015-09-15 – 2015-09-21 (×7): 40 mg via SUBCUTANEOUS
  Filled 2015-09-15 (×7): qty 0.4

## 2015-09-15 MED ORDER — OXYCODONE HCL 5 MG PO TABS
10.0000 mg | ORAL_TABLET | ORAL | Status: DC | PRN
  Filled 2015-09-15: qty 3

## 2015-09-15 MED ORDER — CLONAZEPAM 1 MG PO TABS
1.0000 mg | ORAL_TABLET | Freq: Two times a day (BID) | ORAL | Status: DC
  Administered 2015-09-15 – 2015-09-22 (×14): 1 mg via ORAL
  Filled 2015-09-15 (×14): qty 1

## 2015-09-15 MED ORDER — MORPHINE SULFATE ER 30 MG PO TBCR
45.0000 mg | EXTENDED_RELEASE_TABLET | Freq: Two times a day (BID) | ORAL | Status: DC
  Administered 2015-09-15 – 2015-09-17 (×4): 45 mg via ORAL
  Filled 2015-09-15 (×4): qty 1

## 2015-09-15 MED ORDER — PANTOPRAZOLE SODIUM 40 MG PO TBEC
40.0000 mg | DELAYED_RELEASE_TABLET | Freq: Every day | ORAL | Status: DC
  Administered 2015-09-16 – 2015-09-21 (×6): 40 mg via ORAL
  Filled 2015-09-15 (×7): qty 1

## 2015-09-15 MED ORDER — CLONAZEPAM 0.5 MG PO TABS
0.5000 mg | ORAL_TABLET | Freq: Two times a day (BID) | ORAL | Status: DC
Start: 2015-09-15 — End: 2015-09-15

## 2015-09-15 MED ORDER — SENNA 8.6 MG PO TABS
1.0000 | ORAL_TABLET | Freq: Every day | ORAL | Status: DC
  Administered 2015-09-16 – 2015-09-21 (×6): 8.6 mg via ORAL
  Filled 2015-09-15 (×7): qty 1

## 2015-09-15 MED ORDER — GI COCKTAIL ~~LOC~~
30.0000 mL | Freq: Three times a day (TID) | ORAL | Status: DC | PRN
  Administered 2015-09-15: 30 mL via ORAL
  Filled 2015-09-15 (×2): qty 30

## 2015-09-15 MED ORDER — FLEET ENEMA 7-19 GM/118ML RE ENEM: 1.0000 | ENEMA | Freq: Every day | RECTAL | Status: DC | PRN

## 2015-09-15 MED ORDER — MORPHINE SULFATE ER 30 MG PO TBCR: 45.0000 mg | EXTENDED_RELEASE_TABLET | Freq: Two times a day (BID) | ORAL | Status: DC

## 2015-09-15 MED ORDER — NALOXONE HCL 0.4 MG/ML IJ SOLN: 0.4000 mg | INTRAMUSCULAR | Status: DC | PRN

## 2015-09-15 MED ORDER — METOPROLOL TARTRATE 25 MG PO TABS
25.0000 mg | ORAL_TABLET | Freq: Two times a day (BID) | ORAL | Status: DC
  Administered 2015-09-15 – 2015-09-21 (×7): 25 mg via ORAL
  Filled 2015-09-15 (×13): qty 1

## 2015-09-15 MED ORDER — SODIUM POLYSTYRENE SULFONATE 15 GM/60ML PO SUSP
30.0000 g | Freq: Once | ORAL | Status: AC
  Administered 2015-09-15: 30 g via ORAL
  Filled 2015-09-15: qty 120

## 2015-09-15 MED ORDER — OXYCODONE HCL 5 MG PO TABS
10.0000 mg | ORAL_TABLET | ORAL | Status: DC | PRN
  Administered 2015-09-15: 15 mg via ORAL

## 2015-09-15 MED ORDER — POLYETHYLENE GLYCOL 3350 17 G PO PACK
17.0000 g | PACK | Freq: Every day | ORAL | Status: DC
  Filled 2015-09-15 (×5): qty 1

## 2015-09-15 MED ORDER — OXYCODONE HCL 5 MG PO TABS
15.0000 mg | ORAL_TABLET | ORAL | Status: DC | PRN
  Administered 2015-09-16 (×4): 15 mg via ORAL
  Administered 2015-09-16 – 2015-09-19 (×12): 20 mg via ORAL
  Administered 2015-09-20 (×5): 15 mg via ORAL
  Administered 2015-09-21 (×2): 20 mg via ORAL
  Administered 2015-09-21: 15 mg via ORAL
  Administered 2015-09-22: 20 mg via ORAL
  Filled 2015-09-15: qty 3
  Filled 2015-09-15 (×3): qty 4
  Filled 2015-09-15: qty 3
  Filled 2015-09-15 (×5): qty 4
  Filled 2015-09-15 (×2): qty 3
  Filled 2015-09-15 (×3): qty 4
  Filled 2015-09-15 (×3): qty 3
  Filled 2015-09-15: qty 4
  Filled 2015-09-15: qty 3
  Filled 2015-09-15: qty 4
  Filled 2015-09-15 (×5): qty 3
  Filled 2015-09-15 (×3): qty 4

## 2015-09-15 MED ORDER — MORPHINE SULFATE (PF) 2 MG/ML IV SOLN
2.0000 mg | Freq: Once | INTRAVENOUS | Status: AC
  Administered 2015-09-15: 2 mg via INTRAVENOUS
  Filled 2015-09-15: qty 1

## 2015-09-15 NOTE — Progress Notes (Signed)
    [facility] has been requested to perform a transesophageal echocardiogram on 09/17/15 @ 8am for endcarditis.  After careful review of history and examination, the risks and benefits of transesophageal echocardiogram have been explained including risks of esophageal damage, perforation (1:10,000 risk), bleeding, pharyngeal hematoma as well as other potential complications associated with conscious sedation including aspiration, arrhythmia, respiratory failure and death. Alternatives to treatment were discussed, questions were answered. Patient is willing to proceed. NPO after midnight tomorrow.   Cline CrockHOMPSON, Melanie Crane R PA-C 09/15/2015 1:21 PM

## 2015-09-15 NOTE — Progress Notes (Addendum)
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 Repeat blood cultures on 4/3 negative so far. Cardiology planning TEE on 4/7-initially patient refused. Seen by psychiatry today, patient deemed not to have capacity-is willing to proceed with TEE and willing to continue IV antibiotics as recommended. Place PICC line when okay with infectious disease.  Acute hypoxemic respiratory failure: due to septic emboli.Taper off O2. CTA Chest 4/1 negative for Pul Embolism.  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  Chronic pain syndrome: Claims to have chronic chest pain- for which she abuses opana-claims takes around 40 mg TID-on narcotics currently-claims that pain is not well-controlled as a result she is uncooperative with the medical team to proceed with TEE and continue long-term IV antibiotics. At this time, to ensure compliance to medications/procedures, will increase MS Contin to 45 mg twice a day and continue to use oxycodone for breakthrough pain.  History of IVDA (opiates-crushes opana): Counseled extensively.  Suicidal ideation: Reported that patient made multiple statements about "wanting to die", psych was consulted who recommended IVC. IVC placed. Dr. Elsie SaasJonnalagadda, psychiatry, consulted with patient and determined IVC will remain in place.   History of RUE DVT: PICC-line related during  09/2014 admission- had lupus anticoagulant positive-Dr. Fausto SkillernSamtani-discussed with hematology-Dr.Kale-and felt that patient did not require anticoagulation in this setting furthermore, patient is an active IVDA-noncompliant to antibiotics-and will be a very poor long-term anticoagulation candidate.  Chronic hepatitis C: follow up with ID clinic.   Code Status: Full Family Communication: None at bedside Disposition Plan: Remain inpatient   Consultants:  Psychiatry  Procedures:  None  Antibiotics:  Vancomycin, Rifampin, Gentamicin  HPI/Subjective: States she is doing the same. Is adamant about leaving and states [place].  Objective: Filed Vitals:   09/14/15 2142 09/15/15 0559  BP: 132/91 127/95  Pulse: 86 87  Temp: 98.2 F (36.8 C) 98.3 F (36.8 C)  Resp:  20    Intake/Output Summary (Last 24 hours) at 09/15/15 1339 Last data filed at 09/15/15 1220  Gross per 24 hour  Intake 982.75 ml  Output      0 ml  Net 982.75 ml   Filed Weights   09/14/15 0355 09/14/15 0800 09/15/15 0559  Weight: 71 kg (156 lb 8.4 oz) 70.9 kg (156 lb 4.9 oz) 70.2 kg (154 lb 12.2 oz)    Exam:   General:  Awake, alert, alert and oriented x3  Cardiovascular: RRR  Respiratory: CTAB  Abdomen: soft, nontender, nondistended  Extremities: No edema   Data Reviewed: Basic Metabolic Panel:  Recent Labs Lab 09/11/15 0626 09/11/15 1715 09/12/15 0130 09/13/15 0232 09/14/15 1009 09/15/15 0530  NA  --  140 139 140 137 138  K  --  3.2* 3.2* 3.7 3.5 5.2*  CL  --  114* 110 109 105 106  CO2  --  15* 18* 22 19* 20*  GLUCOSE  --  106* 157* 131* 216* 91  BUN  --  19 18  CREATININE  --  0.97 0.90 0.90 0.90 0.71  CALCIUM 7.4* 7.6* 7.6* 7.8* 7.9* 8.0*  MG 1.5*  --  1.8 1.6*  --   --   PHOS  --   --  3.0  --   --   --    Liver Function Tests:  Recent Labs Lab 09/11/15 1715 09/13/15 0232 09/14/15 1009  09/15/15 0530  AST 21 12* 21 13*  ALT 17 12* 10* 9*  ALKPHOS 72 59 69 59  BILITOT 0.9 0.6 0.7 0.8  PROT 6.1* 6.2* 6.9 6.9  ALBUMIN 2.2* 2.1* 2.2* 2.1*   No results for input(s): LIPASE, AMYLASE in the last 168 hours. No results for input(s): AMMONIA in the last 168 hours. CBC:  Recent Labs Lab 09/11/15 1715 09/12/15 0130 09/13/15 0232 09/14/15 1009 09/15/15 0530  WBC 10.1 6.5 8.6 7.7 8.8  NEUTROABS 7.7  --   --   --  6.1  HGB 10.4* 9.4* 9.7* 10.5* 9.8*  HCT 32.0* 30.1* 31.2* 33.9* 30.5*  MCV 73.4* 73.2* 74.1* 74.7* 75.7*  PLT 53* 40* 77* 111* 141*   Cardiac Enzymes:  Recent Labs Lab 09/10/15 2220 09/11/15 0626 09/11/15 1129 09/11/15 1715  TROPONINI 0.19* 0.03 0.03 0.14*   BNP (last 3 results) No results for input(s): BNP in the last 8760 hours.  ProBNP (last 3 results) No results for input(s): PROBNP in the last 8760 hours.  CBG:  Recent Labs Lab 09/12/15 0405 09/12/15 1626 09/12/15 1927 09/12/15 2353 09/13/15 0408  GLUCAP 143* 108* 119* 191* 114*    Recent Results (from the past 240 hour(s))  Blood culture (routine x 2)     Status: None (Preliminary result)   Collection Time: 09/10/15 11:50 PM  Result Value Ref Range Status   Specimen Description BLOOD RIGHT ARM  Final   Special Requests BOTTLES DRAWN AEROBIC AND ANAEROBIC  Final   Culture NO GROWTH 4 DAYS  Final   Report Status PENDING  Incomplete  Blood culture (routine x 2)     Status: None (Preliminary result)   Collection Time: 09/10/15 11:55 PM  Result Value Ref Range Status   Specimen Description BLOOD RIGHT HAND  Final   Special Requests BOTTLES DRAWN AEROBIC AND ANAEROBIC  Final   Culture NO GROWTH 4 DAYS  Final   Report Status PENDING  Incomplete  Culture, blood (single) w Reflex to ID Panel     Status: None   Collection Time: 09/11/15 12:40 AM  Result Value Ref Range Status   Specimen Description BLOOD RIGHT ARM  Final   Special Requests IN PEDIATRIC BOTTLE  Final    Culture  Setup Time   Final    GRAM POSITIVE COCCI IN CLUSTERS PEDIATRIC BOTTLE CALLED TO FLYNT,F RN 09/11/15 2046 WOOTEN,K CONFIRMED BY V WILKINS    Culture METHICILLIN RESISTANT STAPHYLOCOCCUS AUREUS  Final   Report Status 09/13/2015 FINAL  Final   Organism ID, Bacteria METHICILLIN RESISTANT STAPHYLOCOCCUS AUREUS  Final      Susceptibility   Methicillin resistant staphylococcus aureus - MIC*    CIPROFLOXACIN >=8 RESISTANT Resistant     ERYTHROMYCIN >=8 RESISTANT Resistant     GENTAMICIN <=0.5 SENSITIVE Sensitive     OXACILLIN >=4 RESISTANT Resistant     TETRACYCLINE <=1 SENSITIVE Sensitive     VANCOMYCIN 1 SENSITIVE Sensitive     TRIMETH/SULFA <=10 SENSITIVE Sensitive     CLINDAMYCIN <=0.25 SENSITIVE Sensitive     RIFAMPIN <=0.5 SENSITIVE Sensitive  Inducible Clindamycin NEGATIVE Sensitive     * METHICILLIN RESISTANT STAPHYLOCOCCUS AUREUS  MRSA PCR Screening     Status: Abnormal   Collection Time: 09/11/15  6:54 PM  Result Value Ref Range Status   MRSA by PCR POSITIVE (A) NEGATIVE Final    Comment:        The GeneXpert MRSA Assay (FDA approved for NASAL specimens only), is one component of a comprehensive MRSA colonization surveillance program. It is not intended to diagnose MRSA infection nor to guide or monitor treatment for MRSA infections. RESULT CALLED TO, READ BACK BY AND VERIFIED WITH: T.CRITE,RN 09/11/15  BY V.WILKINS   Culture, blood (Routine X 2) w Reflex to ID Panel     Status: None (Preliminary result)   Collection Time: 09/13/15  4:05 PM  Result Value Ref Range Status   Specimen Description BLOOD LEFT ANTECUBITAL  Final   Special Requests IN PEDIATRIC BOTTLE 3CC  Final   Culture NO GROWTH 2 DAYS  Final   Report Status PENDING  Incomplete  Culture, blood (Routine X 2) w Reflex to ID Panel     Status: None (Preliminary result)   Collection Time: 09/13/15  4:15 PM  Result Value Ref Range Status   Specimen Description BLOOD LEFT ANTECUBITAL  Final    Special Requests IN PEDIATRIC BOTTLE 0.5CC  Final   Culture NO GROWTH 2 DAYS  Final   Report Status PENDING  Incomplete     Studies: No results found.  Scheduled Meds: . Chlorhexidine Gluconate Cloth  6 each Topical Q0600  . clonazePAM  0.5 mg Oral Daily  . cloNIDine  0.1 mg Oral BID  . gentamicin  70 mg Intravenous Q8H  . magnesium oxide  400 mg Oral Daily  . morphine  45 mg Oral Q12H  . mupirocin ointment  1 application Nasal BID  . potassium chloride  40 mEq Oral TID  . rifampin (RIFADIN) IVPB  300 mg Intravenous 3 times per day  . sodium chloride flush  3 mL Intravenous Q12H  . vancomycin  1,250 mg Intravenous Q12H   Continuous Infusions:   Principal Problem:   Narcotic dependence (HCC) Active Problems:   IV drug abuse   Anemia   Thrombocytopenia, unspecified (HCC)   Hepatitis C   Hypokalemia   Septic embolism (HCC)   Endocarditis   Prolonged Q-T interval on ECG   Acute endocarditis   Palliative care encounter   Respiratory failure (HCC)   Endocarditis of native valve   Staphylococcus aureus bacteremia with sepsis (HCC)   Acute renal failure (HCC)   Narcotic abuse   MRSA bacteremia  Time spent: 30 mins  Lorin Glass  [facility] If 7PM-7AM, please contact night-coverage at [email or web address], password [facility] 09/15/2015, 1:39 PM  LOS: 4 days    Attending MD note  Patient was seen, examined,treatment plan was discussed with the PA-S.  I have personally reviewed the clinical findings, lab, imaging studies and management of this patient in detail. I agree with the documentation, as recorded by the PA-S.   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 Has chronic pain, anxiety issues-6 seen by psychiatry, deemed not to have capacity at this present time. She is now willing to proceed with TEE and prolonged IV antibiotics.  Rest as  above.  [facility]

## 2015-09-15 NOTE — Progress Notes (Signed)
Subjective:  Note this is a note that reflects my encounter with her this morning not tonight  Antibiotics:  Anti-infectives    Start     Dose/Rate Route Frequency Ordered Stop   09/14/15 0400  vancomycin (VANCOCIN) 1,250 mg in sodium chloride 0.9 % 250 mL IVPB     1,250 mg 166.7 mL/hr over 90 Minutes Intravenous Every 12 hours 09/13/15 1902     09/13/15 1000  gentamicin (GARAMYCIN) 70 mg in dextrose 5 % 50 mL IVPB     70 mg 103.5 mL/hr over 30 Minutes Intravenous Every 8 hours 09/13/15 0938     09/12/15 1415  rifampin (RIFADIN) 300 mg in sodium chloride 0.9 % 100 mL IVPB     300 mg 200 mL/hr over 30 Minutes Intravenous 3 times per day 09/12/15 1408     09/12/15 1100  ceFAZolin (ANCEF) IVPB 2g/100 mL premix  Status:  Discontinued     2 g 200 mL/hr over 30 Minutes Intravenous Every 8 hours 09/12/15 0957 09/13/15 0907   09/11/15 0900  vancomycin (VANCOCIN) IVPB 1000 mg/200 mL premix  Status:  Discontinued     1,000 mg 200 mL/hr over 60 Minutes Intravenous Every 8 hours 09/11/15 0644 09/13/15 1841   09/11/15 0645  piperacillin-tazobactam (ZOSYN) IVPB 3.375 g  Status:  Discontinued     3.375 g 12.5 mL/hr over 240 Minutes Intravenous 3 times per day 09/11/15 0644 09/12/15 0946   09/11/15 0100  ceFEPIme (MAXIPIME) 2 g in dextrose 5 % 50 mL IVPB     2 g 100 mL/hr over 30 Minutes Intravenous  Once 09/11/15 0056 09/11/15 0352   09/11/15 0045  vancomycin (VANCOCIN) IVPB 1000 mg/200 mL premix     1,000 mg 200 mL/hr over 60 Minutes Intravenous  Once 09/11/15 0040 09/11/15 0207      Medications: Scheduled Meds: . Chlorhexidine Gluconate Cloth  6 each Topical Q0600  . clonazePAM  1 mg Oral BID  . cloNIDine  0.1 mg Oral BID  . enoxaparin (LOVENOX) injection  40 mg Subcutaneous Q24H  . gentamicin  70 mg Intravenous Q8H  . magnesium oxide  400 mg Oral Daily  . metoprolol tartrate  25 mg Oral BID  . morphine  45 mg Oral Q12H  . mupirocin ointment  1 application Nasal BID  .  [START ON 09/16/2015] pantoprazole  40 mg Oral Q1200  . polyethylene glycol  17 g Oral Daily  . rifampin (RIFADIN) IVPB  300 mg Intravenous 3 times per day  . senna  1 tablet Oral QHS  . sodium chloride flush  3 mL Intravenous Q12H  . vancomycin  1,250 mg Intravenous Q12H   Continuous Infusions:  PRN Meds:.acetaminophen **OR** [DISCONTINUED] acetaminophen, bisacodyl, diphenoxylate-atropine, gi cocktail, methocarbamol, naLOXone (NARCAN)  injection, oxyCODONE, sodium phosphate    Objective: Weight change: -3.5 oz (-0.1 kg)  Intake/Output Summary (Last 24 hours) at 09/15/15 2113 Last data filed at 09/15/15 2016  Gross per 24 hour  Intake 1502.75 ml  Output      0 ml  Net 1502.75 ml   Blood pressure 116/85, pulse 92, temperature 98.7 F (37.1 C), temperature source Rectal, resp. rate 20, height 5\' 4"  (1.626 m), weight 154 lb 12.2 oz (70.2 kg), last menstrual period 04/12/2015, SpO2 91 %. Temp:  [97.5 F (36.4 C)-98.7 F (37.1 C)] 98.7 F (37.1 C) (04/05 1827) Pulse Rate:  [86-92] 92 (04/05 1800) Resp:  [20] 20 (04/05 1531) BP: (116-132)/(85-95) 116/85 mmHg (04/05  1800) SpO2:  [87 %-94 %] 91 % (04/05 1800) Weight:  [154 lb 12.2 oz (70.2 kg)] 154 lb 12.2 oz (70.2 kg) (04/05 0559)  Physical Exam: General: aox3 HEENT: anicteric sclera, EOMI, oropharynx clear and without exudate Cardiovascular: regular rate, normal r, i could not hear murmur with contact precaution stethoscope Pulmonary: Fairly clear Gastrointestinal: soft nontender, nondistended, normal bowel sounds, Musculoskeletal: no clubbing or edema noted bilaterally Skin, soft tissue: Multiple areas where she has injected intravenous drugs no obvious abscess Neuro: nonfocal, strength and sensation intact  CBC:  CBC Latest Ref Rng 09/15/2015 09/14/2015 09/13/2015  WBC 4.0 - 10.5 K/uL 8.8 7.7 8.6  Hemoglobin 12.0 - 15.0 g/dL 1.6(X9.8(L) 10.5(L) 9.7(L)  Hematocrit 36.0 - 46.0 % 30.5(L) 33.9(L) 31.2(L)  Platelets 150 - 400 K/uL  141(L) 111(L) 77(L)       BMET  Recent Labs  09/14/15 1009 09/15/15 0530  NA 137 138  K 3.5 5.2*  CL 105 106  CO2 19* 20*  GLUCOSE 216* 91  BUN 12 7  CREATININE 0.90 0.71  CALCIUM 7.9* 8.0*     Liver Panel   Recent Labs  09/14/15 1009 09/15/15 0530  PROT 6.9 6.9  ALBUMIN 2.2* 2.1*  AST 21 13*  ALT 10* 9*  ALKPHOS 69 59  BILITOT 0.7 0.8       Sedimentation Rate No results for input(s): ESRSEDRATE in the last 72 hours. C-Reactive Protein No results for input(s): CRP in the last 72 hours.  Micro Results: Recent Results (from the past 720 hour(s))  Blood culture (routine x 2)     Status: None (Preliminary result)   Collection Time: 09/10/15 11:50 PM  Result Value Ref Range Status   Specimen Description BLOOD RIGHT ARM  Final   Special Requests BOTTLES DRAWN AEROBIC AND ANAEROBIC 5ML  Final   Culture NO GROWTH 4 DAYS  Final   Report Status PENDING  Incomplete  Blood culture (routine x 2)     Status: None (Preliminary result)   Collection Time: 09/10/15 11:55 PM  Result Value Ref Range Status   Specimen Description BLOOD RIGHT HAND  Final   Special Requests BOTTLES DRAWN AEROBIC AND ANAEROBIC 5ML  Final   Culture NO GROWTH 4 DAYS  Final   Report Status PENDING  Incomplete  Culture, blood (single) w Reflex to ID Panel     Status: None   Collection Time: 09/11/15 12:40 AM  Result Value Ref Range Status   Specimen Description BLOOD RIGHT ARM  Final   Special Requests IN PEDIATRIC BOTTLE 3ML  Final   Culture  Setup Time   Final    GRAM POSITIVE COCCI IN CLUSTERS PEDIATRIC BOTTLE CALLED TO FLYNT,F RN 09/11/15 2046 WOOTEN,K CONFIRMED BY V WILKINS    Culture METHICILLIN RESISTANT STAPHYLOCOCCUS AUREUS  Final   Report Status 09/13/2015 FINAL  Final   Organism ID, Bacteria METHICILLIN RESISTANT STAPHYLOCOCCUS AUREUS  Final      Susceptibility   Methicillin resistant staphylococcus aureus - MIC*    CIPROFLOXACIN >=8 RESISTANT Resistant     ERYTHROMYCIN >=8  RESISTANT Resistant     GENTAMICIN <=0.5 SENSITIVE Sensitive     OXACILLIN >=4 RESISTANT Resistant     TETRACYCLINE <=1 SENSITIVE Sensitive     VANCOMYCIN 1 SENSITIVE Sensitive     TRIMETH/SULFA <=10 SENSITIVE Sensitive     CLINDAMYCIN <=0.25 SENSITIVE Sensitive     RIFAMPIN <=0.5 SENSITIVE Sensitive     Inducible Clindamycin NEGATIVE Sensitive     * METHICILLIN RESISTANT STAPHYLOCOCCUS  AUREUS  MRSA PCR Screening     Status: Abnormal   Collection Time: 09/11/15  6:54 PM  Result Value Ref Range Status   MRSA by PCR POSITIVE (A) NEGATIVE Final    Comment:        The GeneXpert MRSA Assay (FDA approved for NASAL specimens only), is one component of a comprehensive MRSA colonization surveillance program. It is not intended to diagnose MRSA infection nor to guide or monitor treatment for MRSA infections. RESULT CALLED TO, READ BACK BY AND VERIFIED WITH: T.CRITE,RN 09/11/15  BY V.WILKINS   Culture, blood (Routine X 2) w Reflex to ID Panel     Status: None (Preliminary result)   Collection Time: 09/13/15  4:05 PM  Result Value Ref Range Status   Specimen Description BLOOD LEFT ANTECUBITAL  Final   Special Requests IN PEDIATRIC BOTTLE 3CC  Final   Culture NO GROWTH 2 DAYS  Final   Report Status PENDING  Incomplete  Culture, blood (Routine X 2) w Reflex to ID Panel     Status: None (Preliminary result)   Collection Time: 09/13/15  4:15 PM  Result Value Ref Range Status   Specimen Description BLOOD LEFT ANTECUBITAL  Final   Special Requests IN PEDIATRIC BOTTLE 0.5CC  Final   Culture NO GROWTH 2 DAYS  Final   Report Status PENDING  Incomplete    Studies/Results: No results found.    Assessment/Plan:  INTERVAL HISTORY:   09/11/15: blood cultures still + 09/12/15--09/14/15: patient sp IVC see chart   Principal Problem:   Narcotic dependence (HCC) Active Problems:   IV drug abuse   Anemia   Thrombocytopenia, unspecified (HCC)   Hepatitis C   Hypokalemia   Septic  embolism (HCC)   Endocarditis   Prolonged Q-T interval on ECG   Acute endocarditis   Palliative care encounter   Respiratory failure (HCC)   Endocarditis of native valve   Staphylococcus aureus bacteremia with sepsis (HCC)   Acute renal failure (HCC)   Narcotic abuse   MRSA bacteremia    Melanie Crane is a 40 y.o. 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 Her transthoracic echocardiogram done here at [facility] suggest that she may potentially have a mitral valve vegetation as well.  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 worsening septic emboli to the lungs:  - TEE is planned --repeat blood cultures from the third 2017 are no growth in 24 hours --continue vanco/rifampin/gent --she needs to have clearance of blood cultures before placement of PICC or central line  Cardiology is seeing the patient and TEE is planned now   #2 Persistent ongoing IV drug abuse with now her fourth episode of endocarditis after her heart valve replacement in 2015: I have a high discrete degree of skepticism about her ability to comply with therapy but maybe there is a way going forward greatly appreciate psychiatry's and palliative cares assistance along with coronal care of the primary team  #3 Possible suicidal ideation: IVC continues greatly appreciate psychiatry's help  #4 chronic hepatitis C without hepatic coma: This could be considered as an outpatient if this patient were ever stable to follow-up in the outpatient world which I have great skepticism about       LOS: 4 days   Acey Lav 09/15/2015,  9:13 PM

## 2015-09-15 NOTE — Consult Note (Signed)
[facility] Face-to-Face Psychiatry Consult Follow up  Reason for Consult:  Suicide ideation and relapse of Opioid IVDA Referring Physician:  Dr. Verlon Au Patient Identification: Melanie Crane MRN:  [id number] Principal Diagnosis: Narcotic dependence [facility]) Diagnosis:   Patient Active Problem List   Diagnosis Date Noted  . MRSA bacteremia [R78.81, B95.62]   . Respiratory failure ([place]) [J96.90]   . 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 Staphylococcus aureus bacteremia with sepsis ([place]) [A41.01]   . Acute renal failure ([place]) [N17.9]   . Narcotic abuse [F11.10]   . Prolonged Q-T interval on ECG [I45.81] 09/11/2015  . Normocytic anemia [D64.9]   . Acute endocarditis [I33.9]   . Narcotic dependence ([place]) [F11.20]   . Palliative care encounter [Z51.5]   . Pain in the chest [R07.9]   . Other iron deficiency anemias [D50.8]   . Acute septic pulmonary embolism without acute cor pulmonale (HCC) [I26.90]   . Sepsis ([place]) [A41.9]   . 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 Embolism, pulmonary with infarction ([place]) [I26.99] 04/11/2015  . Endocarditis [I38] 04/11/2015  . Abscess of right lung with pneumonia ([place]) [J85.1]   . Pulmonary embolism ([place]) [I26.99] 04/10/2015  . Septic embolism ([place]) [I26.90] 04/10/2015  . Lung abscess ([place]) [J85.2] 04/10/2015  . PE (pulmonary embolism) [I26.99] 04/10/2015  . Hepatitis C [B19.20] 12/30/2013  . Hypokalemia [E87.6] 12/30/2013  . UTI (urinary tract infection) [N39.0] 12/30/2013  . Acute bacterial endocarditis - right sided [I33.0] 12/29/2013  . IV drug abuse [F19.10] 12/29/2013  . Anemia [D64.9] 12/29/2013  . Thrombocytopenia, unspecified ([place]) [D69.6] 12/29/2013  . Cigarette smoker [Z72.0] 12/29/2013  . Shock ([place]) [R57.9] 12/28/2013  . Secondary amenorrhea [N91.1] 12/28/2013  . Bacterial myositis [M60.09] 12/28/2013    Total Time spent with patient: 30 minutes  Subjective:   Melanie Crane is a 40 y.o. female patient admitted  with chest pain and shortness of breath.  HPI:  Melanie Crane is a 40 years old married female admitted to [facility] with the shortness of breath and chest pain. Patient has been suffering with opioid dependence and had 3 previous episodes of endocarditis that required open heart surgery in [place] about a year and half ago. Patient reportedly relapsed on drug of abuse about 2 months ago after being sober one and half years. Patient reported she came to [place] and then she relapsed with Opana a intravenous drug use. Patient reportedly abusing marijuana and opioids since age 2 years old. Patient reported her sister died secondary to Opana dependence and related infection in January 2015. Patient reportedly abusing same medication since her sister died. Patient denied drug abuse in biological parents and 3 brothers who live in different states. Patient has a 3 children ages 48, 5 and 78 who has been under care of paternal grandparents. Patient also suffering with hepatitis C secondary to IV drug use. Patient has intact cognitions including orientation, concentration, memory and language functions. Patient denies symptoms of depression, anxiety, bipolar mania, auditory/visual hallucinations, delusions and paranoia. Patient denied suicidal or homicidal ideation. Patient is willing to receive substance abuse rehabilitation and also medication if needed. Past Psychiatric History: Patient denied history of substance abuse rehabilitation and mental health treatment.    Interval history: Patient seen for psychiatric consultation follow-up today as patient has been noncooperative with the treatment recommended by cardiology. Patient stated that she is not getting enough pain medication management for endocarditis and hoping she can go to [place] she will get more appropriate  treatment. Patient is also having unrealistic expectations of flying to [place] and being admitted into Hospital under care of  cardiologist for endocarditis. Patient has no labile resources and not safe to be discharged from the hospital. Patient also previously reported to couple of staff members and family regarding wish to die which patient denied during my evaluation. Patient is willing to continue her treatment as long as she her pain medication was adjusted. Case discussed with Dr. Sloan Leiter who is willing to adjust pain medication and also case will be discussed with the cardiologist regarding patient consent to get TEE and antibiotic therapy for endocarditis.  Risk to Self: Is patient at risk for suicide?: No Risk to Others:   Prior Inpatient Therapy:   Prior Outpatient Therapy:    Past Medical History:  Past Medical History  Diagnosis Date  . Anemia   . IVDU (intravenous drug user)   . 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 Hepatitis C   . Miscarriage     Five times  . ARF (acute renal failure) ([place]) 4//2017    Past Surgical History  Procedure Laterality Date  . Bunionectomy    . Tonsillectomy    . 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 Tee without cardioversion N/A 04/14/2015    Procedure: TRANSESOPHAGEAL ECHOCARDIOGRAM (TEE);  Surgeon: Lelon Perla, MD;  Location: [facility] ENDOSCOPY;  Service: Cardiovascular;  Laterality: N/A;   Family History:  Family History  Problem Relation Age of Onset  . Hypertension Father   . Kidney failure Father   . Hyperlipidemia Mother   . CAD Mother   . Deafness Mother    Family Psychiatric  History: Patient's sister suffered with a drug of abuse and died secondary to drug related infection. Social History:  History  Alcohol Use No     History  Drug Use  . Yes  . Special: IV, Opium    Comment: injectable opana in L forarm for 2-3 month    Social History   Social History  . Marital Status: Divorced    Spouse Name: N/A  . Number of Children: N/A  . Years of  Education: N/A   Occupational History  . Unemployed    Social History Main Topics  . Smoking status: Current Every Day Smoker -- 1.00 packs/day for 10 years    Types: Cigarettes  . Smokeless tobacco: Never Used     Comment: 1 1/2 ppd  . Alcohol Use: No  . Drug Use: Yes    Special: IV, Opium     Comment: injectable opana in L forarm for 2-3 month  . Sexual Activity: Not Asked   Other Topics Concern  . None   Social History Narrative   Was living in [place], [place]. In [place] visiting family.   Additional Social History:    Allergies:  No Known Allergies  Labs:  Results for orders placed or performed during the hospital encounter of 09/10/15 (from the past 48 hour(s))  Culture, blood (Routine X 2) w Reflex to ID Panel     Status: None (Preliminary result)   Collection Time: 09/13/15  4:05 PM  Result Value Ref Range   Specimen Description BLOOD LEFT ANTECUBITAL    Special Requests IN PEDIATRIC BOTTLE 3CC    Culture NO GROWTH < 24 HOURS    Report Status PENDING   Culture, blood (Routine X 2) w Reflex to  ID Panel     Status: None (Preliminary result)   Collection Time: 09/13/15  4:15 PM  Result Value Ref Range   Specimen Description BLOOD LEFT ANTECUBITAL    Special Requests IN PEDIATRIC BOTTLE 0.5CC    Culture NO GROWTH < 24 HOURS    Report Status PENDING   Vancomycin, trough     Status: Abnormal   Collection Time: 09/13/15  4:19 PM  Result Value Ref Range   Vancomycin Tr 29 (HH) 10.0 - 20.0 ug/mL    Comment: CRITICAL RESULT CALLED TO, READ BACK BY AND VERIFIED WITH: L Andrey Campanile [id number] 09/13/15 D BRADLEY   hCG, quantitative, pregnancy     Status: None   Collection Time: 09/14/15 10:09 AM  Result Value Ref Range   hCG, Beta Chain, Quant, S <1 <5 mIU/mL    Comment:          GEST. AGE      CONC.  (mIU/mL)   <=1 WEEK        5 - 50     2 WEEKS       50 - 500     3 WEEKS       100 - 10,000     4 WEEKS     1,000 - 30,000     5 WEEKS     3,500 - 115,000   6-8 WEEKS      12,000 - 270,000    12 WEEKS     15,000 - 220,000        FEMALE AND NON-PREGNANT FEMALE:     LESS THAN 5 mIU/mL   Gentamicin level, trough     Status: None   Collection Time: 09/14/15 10:09 AM  Result Value Ref Range   Gentamicin Trough 0.7 0.5 - 2.0 ug/mL    Comment: Performed at [facility]  CBC     Status: Abnormal   Collection Time: 09/14/15 10:09 AM  Result Value Ref Range   WBC 7.7 4.0 - 10.5 K/uL   RBC 4.54 3.87 - 5.11 MIL/uL   Hemoglobin 10.5 (L) 12.0 - 15.0 g/dL   HCT 33.9 (L) 36.0 - 46.0 %   MCV 74.7 (L) 78.0 - 100.0 fL   MCH 23.1 (L) 26.0 - 34.0 pg   MCHC 31.0 30.0 - 36.0 g/dL   RDW 17.4 (H) 11.5 - 15.5 %   Platelets 111 (L) 150 - 400 K/uL    Comment: CONSISTENT WITH PREVIOUS RESULT  Comprehensive metabolic panel     Status: Abnormal   Collection Time: 09/14/15 10:09 AM  Result Value Ref Range   Sodium 137 135 - 145 mmol/L   Potassium 3.5 3.5 - 5.1 mmol/L   Chloride 105 101 - 111 mmol/L   CO2 19 (L) 22 - 32 mmol/L   Glucose, Bld 216 (H) 65 - 99 mg/dL   BUN 12 6 - 20 mg/dL   Creatinine, Ser 0.90 0.44 - 1.00 mg/dL   Calcium 7.9 (L) 8.9 - 10.3 mg/dL   Total Protein 6.9 6.5 - 8.1 g/dL   Albumin 2.2 (L) 3.5 - 5.0 g/dL   AST 21 15 - 41 U/L   ALT 10 (L) 14 - 54 U/L   Alkaline Phosphatase 69 38 - 126 U/L   Total Bilirubin 0.7 0.3 - 1.2 mg/dL   GFR calc non Af Amer >60 >60 mL/min   GFR calc Af Amer >60 >60 mL/min    Comment: (NOTE) The eGFR has been calculated using the CKD  EPI equation. This calculation has not been validated in all clinical situations. eGFR's persistently <60 mL/min signify possible Chronic Kidney Disease.    Anion gap 13 5 - 15  Comprehensive metabolic panel     Status: Abnormal   Collection Time: 09/15/15  5:30 AM  Result Value Ref Range   Sodium 138 135 - 145 mmol/L   Potassium 5.2 (H) 3.5 - 5.1 mmol/L   Chloride 106 101 - 111 mmol/L   CO2 20 (L) 22 - 32 mmol/L   Glucose, Bld 91 65 - 99 mg/dL   BUN 7 6 - 20 mg/dL    Creatinine, Ser 0.71 0.44 - 1.00 mg/dL   Calcium 8.0 (L) 8.9 - 10.3 mg/dL   Total Protein 6.9 6.5 - 8.1 g/dL   Albumin 2.1 (L) 3.5 - 5.0 g/dL   AST 13 (L) 15 - 41 U/L   ALT 9 (L) 14 - 54 U/L   Alkaline Phosphatase 59 38 - 126 U/L   Total Bilirubin 0.8 0.3 - 1.2 mg/dL   GFR calc non Af Amer >60 >60 mL/min   GFR calc Af Amer >60 >60 mL/min    Comment: (NOTE) The eGFR has been calculated using the CKD EPI equation. This calculation has not been validated in all clinical situations. eGFR's persistently <60 mL/min signify possible Chronic Kidney Disease.    Anion gap 12 5 - 15  CBC with Differential/Platelet     Status: Abnormal   Collection Time: 09/15/15  5:30 AM  Result Value Ref Range   WBC 8.8 4.0 - 10.5 K/uL   RBC 4.03 3.87 - 5.11 MIL/uL   Hemoglobin 9.8 (L) 12.0 - 15.0 g/dL   HCT 30.5 (L) 36.0 - 46.0 %   MCV 75.7 (L) 78.0 - 100.0 fL   MCH 24.3 (L) 26.0 - 34.0 pg   MCHC 32.1 30.0 - 36.0 g/dL   RDW 17.8 (H) 11.5 - 15.5 %   Platelets 141 (L) 150 - 400 K/uL   Neutrophils Relative % 69 %   Neutro Abs 6.1 1.7 - 7.7 K/uL   Lymphocytes Relative 22 %   Lymphs Abs 1.9 0.7 - 4.0 K/uL   Monocytes Relative 9 %   Monocytes Absolute 0.8 0.1 - 1.0 K/uL   Eosinophils Relative 0 %   Eosinophils Absolute 0.0 0.0 - 0.7 K/uL   Basophils Relative 0 %   Basophils Absolute 0.0 0.0 - 0.1 K/uL  Protime-INR     Status: Abnormal   Collection Time: 09/15/15  5:30 AM  Result Value Ref Range   Prothrombin Time 16.8 (H) 11.6 - 15.2 seconds   INR 1.35 0.00 - 1.49    Current Facility-Administered Medications  Medication Dose Route Frequency Provider Last Rate Last Dose  . acetaminophen (TYLENOL) tablet 650 mg  650 mg Oral Q6H PRN Ilene Qua Opyd, MD      . bisacodyl (DULCOLAX) EC tablet 5 mg  5 mg Oral Daily PRN Vianne Bulls, MD      . Chlorhexidine Gluconate Cloth 2 % PADS 6 each  6 each Topical Q0600 Rush Farmer, MD   6 each at 09/15/15 [phone]  . clonazePAM (KLONOPIN) tablet 0.5 mg  0.5 mg  Oral Daily Dory Horn, NP   0.5 mg at 09/14/15 0959  . cloNIDine (CATAPRES) tablet 0.1 mg  0.1 mg Oral BID Nita Sells, MD   0.1 mg at 09/14/15 2158  . diphenoxylate-atropine (LOMOTIL) 2.5-0.025 MG per tablet 1 tablet  1 tablet Oral QID  PRN Nita Sells, MD      . gentamicin (GARAMYCIN) 70 mg in dextrose 5 % 50 mL IVPB  70 mg Intravenous [address] RPH   70 mg at 09/15/15 0155  . magnesium oxide (MAG-OX) tablet 400 mg  400 mg Oral Daily Nita Sells, MD   400 mg at 09/14/15 0958  . methocarbamol (ROBAXIN) tablet 500 mg  500 mg Oral Q6H PRN Nita Sells, MD   500 mg at 09/14/15 1831  . morphine (MS CONTIN) 12 hr tablet 30 mg  30 mg Oral Q12H Nita Sells, MD   30 mg at 09/14/15 2158  . mupirocin ointment (BACTROBAN) 2 % 1 application  1 application Nasal BID Rush Farmer, MD   1 application at 93/26/71 2158  . oxyCODONE-acetaminophen (PERCOCET/ROXICET) 5-325 MG per tablet 1-2 tablet  1-2 tablet Oral Q4H PRN Nita Sells, MD   2 tablet at 09/15/15 0804  . polyethylene glycol (MIRALAX / GLYCOLAX) packet 17 g  17 g Oral Daily PRN Ilene Qua Opyd, MD      . potassium chloride SA (K-DUR,KLOR-CON) CR tablet 40 mEq  40 mEq Oral TID Nita Sells, MD   40 mEq at 09/14/15 2157  . rifampin (RIFADIN) 300 mg in sodium chloride 0.9 % 100 mL IVPB  300 mg Intravenous 3 times per day Truman Hayward, MD   300 mg at 09/15/15 [phone]  . sodium chloride flush (NS) 0.9 % injection 3 mL  3 mL Intravenous Q12H Vianne Bulls, MD   3 mL at 09/14/15 2158  . vancomycin (VANCOCIN) 1,250 mg in sodium chloride 0.9 % 250 mL IVPB  1,250 mg Intravenous Q12H Nita Sells, MD   1,250 mg at 09/15/15 0408    Musculoskeletal: Strength & Muscle Tone: within normal limits Gait & Station: unable to stand Patient leans: N/A  Psychiatric Specialty Exam: ROS Follow-up   Blood pressure 127/95, pulse 87, temperature 98.3 F (36.8 C), temperature source Oral,  resp. rate 20, height 5' 4"  (1.626 m), weight 70.2 kg (154 lb 12.2 oz), last menstrual period 04/12/2015, SpO2 94 %.Body mass index is 26.55 kg/(m^2).  General Appearance: Guarded  Eye Contact::  Good  Speech:  Clear and Coherent  Volume:  Decreased  Mood:  Anxious and Depressed  Affect:  Constricted and Depressed  Thought Process:  Coherent and Goal Directed  Orientation:  Full (Time, Place, and Person)  Thought Content:  WDL  Suicidal Thoughts:  No  Homicidal Thoughts:  No  Memory:  Immediate;   Good Recent;   Fair Remote;   Fair  Judgement:  Impaired  Insight:  Fair  Psychomotor Activity:  Restlessness  Concentration:  Fair  Recall:  [employer] of Knowledge:Good  Language: Good  Akathisia:  Negative  Handed:  Right  AIMS (if indicated):     Assets:  Communication Skills Desire for Improvement Financial Resources/Insurance Housing Intimacy Leisure Time Resilience Social Support Transportation  ADL's:  Impaired  Cognition: WNL  Sleep:      Treatment Plan Summary: Continue involuntary commitment for medical treatment, has patient lost her capacity to make appropriate medical conditions and has difficulty understanding comment complicated and danger of possible death apical noncompliant or not able to access care.  Spoke with the patient mother and a niece who was visiting patient, patient mother has been deaf but able to read lips much better when talking who is concerned about patient supportive of patient getting 6 weeks of antibiotic treatment for endocarditis  as recommended by cardiology.  After a brief discussion with the patient she is willing to participate voluntarily in echocardiogram as recommended by cardiologist and also overnight fasting which is required   Patient is also willing to cooperate with the IV antibiotics therapy and requesting more pain medication management as her current medication is not controlling her pain from endocarditis.   Patient will  be referred to the residential substance abuse rehabilitation program and medically stable  Referred to the unit social work regarding rehabilitation placement   Disposition: Recommended residential substance abuse rehabilitation program at [facility] when medically stable. Supportive therapy provided about ongoing stressors.  Durward Parcel., MD 09/15/2015 9:20 AM

## 2015-09-15 NOTE — Progress Notes (Signed)
Patient complaining of chest pain unrelieved by PRN medicine and SOB. Sat O2=88% on room air. Patient received oxygen 2L on [place] and Sat O2=91%. MD notified and the patient was placed on tele monitor(box 09) and continue pulse ox. MD on the floor to check the patient and gave new orders for pain medication and increased heart rate. Will continue to monitor.

## 2015-09-15 NOTE — Care Management Note (Signed)
Case Management Note  Patient Details  Name: Melanie Crane MRN: [id number] Date of Birth: 02/09/1976  Subjective/Objective:                 Spoke with patient in the room. [facility] [place] [facility] and lives between her mother's house and her friend Melanie Crane's house in [place] [address] [place]. Patient with previous valve replacement, and recurrent endocarditis. Awaiting TEE tomorrow. History of IV drug use.    Action/Plan:  Will require long term Abx to fully treat, Due to drug history may require SNF for long term Abx, patient does not a PICC at this point in time. CM will continue to follow.   Expected Discharge Date:                  Expected Discharge Plan:  Skilled Nursing Facility  In-House Referral:     Discharge planning Services  CM Consult  Post Acute Care Choice:    Choice offered to:     DME Arranged:    DME Agency:     HH Arranged:    HH Agency:     Status of Service:  In process, will continue to follow  Medicare Important Message Given:    Date Medicare IM Given:    Medicare IM give by:    Date Additional Medicare IM Given:    Additional Medicare Important Message give by:     If discussed at Long Length of Stay Meetings, dates discussed:    Additional Comments:  Lawerance SabalDebbie Shourya Macpherson, RN 09/15/2015, 2:52 PM

## 2015-09-16 ENCOUNTER — Inpatient Hospital Stay (HOSPITAL_COMMUNITY): Payer: Self-pay

## 2015-09-16 DIAGNOSIS — D638 Anemia in other chronic diseases classified elsewhere: Secondary | ICD-10-CM

## 2015-09-16 DIAGNOSIS — R079 Chest pain, unspecified: Secondary | ICD-10-CM

## 2015-09-16 DIAGNOSIS — R0902 Hypoxemia: Secondary | ICD-10-CM

## 2015-09-16 LAB — CULTURE, BLOOD (ROUTINE X 2)
Culture: NO GROWTH
Culture: NO GROWTH

## 2015-09-16 LAB — CBC
HCT: 33.3 % — ABNORMAL LOW (ref 36.0–46.0)
Hemoglobin: 10.4 g/dL — ABNORMAL LOW (ref 12.0–15.0)
MCH: 24 pg — AB (ref 26.0–34.0)
MCHC: 31.2 g/dL (ref 30.0–36.0)
MCV: 76.7 fL — AB (ref 78.0–100.0)
PLATELETS: 135 10*3/uL — AB (ref 150–400)
RBC: 4.34 MIL/uL (ref 3.87–5.11)
RDW: 19 % — AB (ref 11.5–15.5)
WBC: 10.4 10*3/uL (ref 4.0–10.5)

## 2015-09-16 LAB — HEPATITIS C GENOTYPE: HCV Genotype: 3

## 2015-09-16 LAB — BASIC METABOLIC PANEL
Anion gap: 12 (ref 5–15)
BUN: 7 mg/dL (ref 6–20)
CALCIUM: 8.1 mg/dL — AB (ref 8.9–10.3)
CO2: 23 mmol/L (ref 22–32)
Chloride: 102 mmol/L (ref 101–111)
Creatinine, Ser: 0.82 mg/dL (ref 0.44–1.00)
GFR calc Af Amer: >60 mL/min (ref >60)
GLUCOSE: 92 mg/dL (ref 65–99)
POTASSIUM: 4.8 mmol/L (ref 3.5–5.1)
Sodium: 137 mmol/L (ref 135–145)

## 2015-09-16 LAB — VANCOMYCIN, TROUGH: VANCOMYCIN TR: 15 ug/mL (ref 10.0–20.0)

## 2015-09-16 MED ORDER — VANCOMYCIN HCL 10 G IV SOLR
1500.0000 mg | Freq: Two times a day (BID) | INTRAVENOUS | Status: DC
  Administered 2015-09-17 – 2015-09-21 (×9): 1500 mg via INTRAVENOUS
  Filled 2015-09-16 (×9): qty 1500

## 2015-09-16 MED ORDER — MORPHINE SULFATE (PF) 2 MG/ML IV SOLN
2.0000 mg | Freq: Once | INTRAVENOUS | Status: AC
  Administered 2015-09-16: 2 mg via INTRAVENOUS
  Filled 2015-09-16: qty 1

## 2015-09-16 NOTE — Progress Notes (Signed)
PATIENT DETAILS Name: Melanie Crane Age: [age] y.o. Sex: female Date of Birth: 12/15/1975 Admit Date: 09/10/2015 Admitting Physician Alyson ReedyWesam G Yacoub, MD PCP:No PCP Per Patient  Subjective: Pain well controlled-pain is mostly in her chest/upper abdomen (this is chronic). Is on oxygen-claims she is slightly more short of breath.  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 Repeat blood cultures on 4/3 negative so far. Cardiology planning TEE on 4/7-initially patient refused. Seen by psychiatry today, patient deemed not to have capacity-is willing to proceed with TEE and willing to continue IV antibiotics as recommended. Place PICC line when okay with infectious disease.  Acute hypoxemic respiratory failure: Likely due to septic emboli.Recent CTA Chest 4/1 negative for Pul Embolism. Chest x-ray done today does not show any major abnormalities except for known septic emboli/nodular lesions. Continue to provide supportive care and taper off oxygen when possible. Not sure if anxiety is playing a role.  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  Chronic pain syndrome: Claims to have chronic chest pain- for which she abuses opana-claims takes around 40 mg TID-after discussion with psychiatry-we have increased MS Contin to 45 mg twice a day, and have increased dosage for oxycodone for breakthrough pain. Currently pain seems adequately well-controlled per patient. Difficult situation, in order to ensure  compliance to medication and procedure she is going to need adequate pain control, once all procedures have been completed, we will need to slowly start tapering off narcotics.   History of IVDA (opiates-crushes opana): Counseled extensively.  Mild thrombocytopenia: Improving. Watch closely while on Lovenox.  Anemia: Likely secondary to acute illness/endocarditis-follow periodically.  Suicidal ideation: Reported that patient made multiple statements about "wanting to die", psych was consulted who recommended IVC. IVC placed. Dr. Elsie SaasJonnalagadda, psychiatry, consulted with patient and determined IVC will remain in place.   History of RUE DVT: PICC-line related during 09/2014 admission- had lupus anticoagulant positive-Dr. Fausto SkillernSamtani-discussed with hematology-Dr.Kale-and felt that patient did not require anticoagulation in this setting furthermore, patient is an active IVDA-noncompliant to antibiotics-and will be a very poor long-term anticoagulation candidate.  Chronic hepatitis C: follow up with ID clinic.   Disposition: Remain inpatient  Antimicrobial agents  See below  Anti-infectives    Start     Dose/Rate Route Frequency Ordered Stop   09/14/15 0400  vancomycin (VANCOCIN) 1,250 mg in sodium chloride 0.9 % 250 mL IVPB     1,250 mg 166.7 mL/hr over 90 Minutes Intravenous Every 12 hours 09/13/15 1902     09/13/15 1000  gentamicin (GARAMYCIN) 70 mg in dextrose 5 % 50 mL IVPB     70 mg 103.5 mL/hr over 30 Minutes Intravenous Every 8 hours 09/13/15 0938     09/12/15 1415  rifampin (RIFADIN) 300 mg in sodium chloride 0.9 % 100 mL IVPB     300 mg 200 mL/hr over 30 Minutes Intravenous 3 times per day 09/12/15 1408     09/12/15 1100  ceFAZolin (ANCEF) IVPB 2g/100 mL premix  Status:  Discontinued     2 g 200 mL/hr over 30  Minutes Intravenous Every 8 hours 09/12/15 0957 09/13/15 0907   09/11/15 0900  vancomycin (VANCOCIN) IVPB 1000 mg/200 mL premix  Status:  Discontinued     1,000 mg 200 mL/hr  over 60 Minutes Intravenous Every 8 hours 09/11/15 0644 09/13/15 1841   09/11/15 0645  piperacillin-tazobactam (ZOSYN) IVPB 3.375 g  Status:  Discontinued     3.375 g 12.5 mL/hr over 240 Minutes Intravenous 3 times per day 09/11/15 0644 09/12/15 0946   09/11/15 0100  ceFEPIme (MAXIPIME) 2 g in dextrose 5 % 50 mL IVPB     2 g 100 mL/hr over 30 Minutes Intravenous  Once 09/11/15 0056 09/11/15 0352   09/11/15 0045  vancomycin (VANCOCIN) IVPB 1000 mg/200 mL premix     1,000 mg 200 mL/hr over 60 Minutes Intravenous  Once 09/11/15 0040 09/11/15 0207      DVT Prophylaxis: Prophylactic Lovenox   Code Status: Full code   Family Communication None at bedsdie  Procedures: None  CONSULTS:  ID, psychiatry and Palliative care  Time spent 30 minutes-Greater than 50% of this time was spent in counseling, explanation of diagnosis, planning of further management, and coordination of care.  MEDICATIONS: Scheduled Meds: . clonazePAM  1 mg Oral BID  . cloNIDine  0.1 mg Oral BID  . enoxaparin (LOVENOX) injection  40 mg Subcutaneous Q24H  . gentamicin  70 mg Intravenous Q8H  . magnesium oxide  400 mg Oral Daily  . metoprolol tartrate  25 mg Oral BID  . morphine  45 mg Oral Q12H  . pantoprazole  40 mg Oral Q1200  . polyethylene glycol  17 g Oral Daily  . rifampin (RIFADIN) IVPB  300 mg Intravenous 3 times per day  . senna  1 tablet Oral QHS  . sodium chloride flush  3 mL Intravenous Q12H  . vancomycin  1,250 mg Intravenous Q12H   Continuous Infusions:  PRN Meds:.acetaminophen **OR** [DISCONTINUED] acetaminophen, bisacodyl, diphenoxylate-atropine, gi cocktail, methocarbamol, naLOXone (NARCAN)  injection, oxyCODONE, sodium phosphate    PHYSICAL EXAM: Vital signs in last 24 hours: Filed Vitals:   09/15/15 1827 09/15/15 2117 09/16/15 0435 09/16/15 0500  BP:  115/82 122/84 125/67  Pulse:  87 84 94  Temp: 98.7 F (37.1 C) 99 F (37.2 C) 99.4 F (37.4 C) 98.4 F (36.9 C)  TempSrc:  Rectal Oral Oral Oral  Resp:  19 20   Height:      Weight:      SpO2:  95% 97% 97%    Weight change:  Filed Weights   09/14/15 0355 09/14/15 0800 09/15/15 0559  Weight: 71 kg (156 lb 8.4 oz) 70.9 kg (156 lb 4.9 oz) 70.2 kg (154 lb 12.2 oz)   Body mass index is 26.55 kg/(m^2).   Gen Exam: Awake and alert with clear speech.   Neck: Supple, No JVD.   Chest: B/L Clear.   CVS: S1 S2 Regular, no murmurs.  Abdomen: soft, BS +, non tender, non distended.  Extremities: no edema, lower extremities warm to touch. Neurologic: Non Focal.  Skin: No Rash.   Wounds: N/A.    Intake/Output from previous day:  Intake/Output Summary (Last 24 hours) at 09/16/15 1405 Last data filed at 09/16/15 0934  Gross per 24 hour  Intake 1262.75 ml  Output      0 ml  Net 1262.75 ml     LAB RESULTS: CBC  Recent Labs Lab 09/11/15 1715 09/12/15 0130 09/13/15 0232 09/14/15 1009 09/15/15 0530 09/16/15 0529  WBC 10.1 6.5 8.6  7.7 8.8 10.4  HGB 10.4* 9.4* 9.7* 10.5* 9.8* 10.4*  HCT 32.0* 30.1* 31.2* 33.9* 30.5* 33.3*  PLT 53* 40* 77* 111* 141* 135*  MCV 73.4* 73.2* 74.1* 74.7* 75.7* 76.7*  MCH 23.9* 22.9* 23.0* 23.1* 24.3* 24.0*  MCHC 32.5 31.2 31.1 31.0 32.1 31.2  RDW 17.8* 17.5* 17.7* 17.4* 17.8* 19.0*  LYMPHSABS 1.1  --   --   --  1.9  --   MONOABS 1.3*  --   --   --  0.8  --   EOSABS 0.0  --   --   --  0.0  --   BASOSABS 0.0  --   --   --  0.0  --     Chemistries   Recent Labs Lab 09/11/15 0626  09/12/15 0130 09/13/15 0232 09/14/15 1009 09/15/15 0530 09/16/15 0529  NA  --   < > 139 140 137 138 137  K  --   < > 3.2* 3.7 3.5 5.2* 4.8  CL  --   < > 110 109 105 106 102  CO2  --   < > 18* 22 19* 20* 23  GLUCOSE  --   < > 157* 131* 216* 91 92  BUN  --   < > CREATININE  --   < > 0.90 0.90 0.90 0.71 0.82  CALCIUM 7.4*  < > 7.6* 7.8* 7.9* 8.0* 8.1*  MG 1.5*  --  1.8 1.6*  --   --   --   < > = values in this interval not displayed.  CBG:  Recent Labs Lab  09/12/15 0405 09/12/15 1626 09/12/15 1927 09/12/15 2353 09/13/15 0408  GLUCAP 143* 108* 119* 191* 114*    GFR Estimated Creatinine Clearance: 88.6 mL/min (by C-G formula based on Cr of 0.82).  Coagulation profile  Recent Labs Lab 09/10/15 2220 09/12/15 0130 09/13/15 0232 09/15/15 0530  INR 1.26 2.26* 1.32 1.35    Cardiac Enzymes  Recent Labs Lab 09/11/15 0626 09/11/15 1129 09/11/15 1715  TROPONINI 0.03 0.03 0.14*    Invalid input(s): POCBNP No results for input(s): DDIMER in the last 72 hours. No results for input(s): HGBA1C in the last 72 hours. No results for input(s): CHOL, HDL, LDLCALC, TRIG, CHOLHDL, LDLDIRECT in the last 72 hours. No results for input(s): TSH, T4TOTAL, T3FREE, THYROIDAB in the last 72 hours.  Invalid input(s): FREET3 No results for input(s): VITAMINB12, FOLATE, FERRITIN, TIBC, IRON, RETICCTPCT in the last 72 hours. No results for input(s): LIPASE, AMYLASE in the last 72 hours.  Urine Studies No results for input(s): UHGB, CRYS in the last 72 hours.  Invalid input(s): UACOL, UAPR, USPG, UPH, UTP, UGL, UKET, UBIL, UNIT, UROB, ULEU, UEPI, UWBC, URBC, UBAC, CAST, UCOM, BILUA  MICROBIOLOGY: Recent Results (from the past 240 hour(s))  Blood culture (routine x 2)     Status: None   Collection Time: 09/10/15 11:50 PM  Result Value Ref Range Status   Specimen Description BLOOD RIGHT ARM  Final   Special Requests BOTTLES DRAWN AEROBIC AND ANAEROBIC  Final   Culture NO GROWTH 5 DAYS  Final   Report Status 09/16/2015 FINAL  Final  Blood culture (routine x 2)     Status: None   Collection Time: 09/10/15 11:55 PM  Result Value Ref Range Status   Specimen Description BLOOD RIGHT HAND  Final   Special Requests BOTTLES DRAWN AEROBIC AND ANAEROBIC  Final   Culture NO GROWTH 5 DAYS  Final   Report Status 09/16/2015 FINAL  Final  Culture, blood (single) w Reflex to ID Panel     Status: None   Collection Time: 09/11/15 12:40 AM  Result Value  Ref Range Status   Specimen Description BLOOD RIGHT ARM  Final   Special Requests IN PEDIATRIC BOTTLE  Final   Culture  Setup Time   Final    GRAM POSITIVE COCCI IN CLUSTERS PEDIATRIC BOTTLE CALLED TO FLYNT,F RN 09/11/15 2046 WOOTEN,K CONFIRMED BY V WILKINS    Culture METHICILLIN RESISTANT STAPHYLOCOCCUS AUREUS  Final   Report Status 09/13/2015 FINAL  Final   Organism ID, Bacteria METHICILLIN RESISTANT STAPHYLOCOCCUS AUREUS  Final      Susceptibility   Methicillin resistant staphylococcus aureus - MIC*    CIPROFLOXACIN >=8 RESISTANT Resistant     ERYTHROMYCIN >=8 RESISTANT Resistant     GENTAMICIN <=0.5 SENSITIVE Sensitive     OXACILLIN >=4 RESISTANT Resistant     TETRACYCLINE <=1 SENSITIVE Sensitive     VANCOMYCIN 1 SENSITIVE Sensitive     TRIMETH/SULFA <=10 SENSITIVE Sensitive     CLINDAMYCIN <=0.25 SENSITIVE Sensitive     RIFAMPIN <=0.5 SENSITIVE Sensitive     Inducible Clindamycin NEGATIVE Sensitive     * METHICILLIN RESISTANT STAPHYLOCOCCUS AUREUS  MRSA PCR Screening     Status: Abnormal   Collection Time: 09/11/15  6:54 PM  Result Value Ref Range Status   MRSA by PCR POSITIVE (A) NEGATIVE Final    Comment:        The GeneXpert MRSA Assay (FDA approved for NASAL specimens only), is one component of a comprehensive MRSA colonization surveillance program. It is not intended to diagnose MRSA infection nor to guide or monitor treatment for MRSA infections. RESULT CALLED TO, READ BACK BY AND VERIFIED WITH: T.CRITE,RN 09/11/15 @2042  BY V.WILKINS   Culture, blood (Routine X 2) w Reflex to ID Panel     Status: None (Preliminary result)   Collection Time: 09/13/15  4:05 PM  Result Value Ref Range Status   Specimen Description BLOOD LEFT ANTECUBITAL  Final   Special Requests IN PEDIATRIC BOTTLE 3CC  Final   Culture NO GROWTH 3 DAYS  Final   Report Status PENDING  Incomplete  Culture, blood (Routine X 2) w Reflex to ID Panel     Status: None (Preliminary result)    Collection Time: 09/13/15  4:15 PM  Result Value Ref Range Status   Specimen Description BLOOD LEFT ANTECUBITAL  Final   Special Requests IN PEDIATRIC BOTTLE 0.5CC  Final   Culture NO GROWTH 3 DAYS  Final   Report Status PENDING  Incomplete    RADIOLOGY STUDIES/RESULTS: Dg Chest 2 View  09/12/2015  CLINICAL DATA:  Respiratory failure. EXAM: CHEST  2 VIEW COMPARISON:  September 11, 2015. FINDINGS: Stable cardiomegaly. No pneumothorax is noted. Mild bibasilar opacities are noted concerning for subsegmental atelectasis or possibly inflammation. Bony thorax is unremarkable. IMPRESSION: Mild bibasilar opacities concerning for subsegmental atelectasis or possibly inflammation. Electronically Signed   By: Lupita Raider, M.D.   On: 09/12/2015 09:11   Dg Chest 2 View  09/10/2015  CLINICAL DATA:  Acute onset of generalized chest pain and shortness of breath. Drowsiness. Initial encounter. EXAM: CHEST  2 VIEW COMPARISON:  CTA of the chest performed 04/10/2015 FINDINGS: Right midlung density, measuring 3.1 cm, demonstrates increased density compared to prior studies. This may reflect residual or recurrent cavitating pneumonia or abscess, though a mass cannot be excluded. No pleural effusion or pneumothorax  is seen. The heart is normal in size. No acute osseous abnormalities are identified. IMPRESSION: Right midlung density, measuring 3.1 cm, demonstrates increased density compared to prior studies. This may reflect residual or recurrent cavitating pneumonia or abscess, though a mass cannot be excluded. CT of the chest could be considered for further evaluation, when and as deemed clinically appropriate. Electronically Signed   By: Roanna Raider M.D.   On: 09/10/2015 23:23   Ct Angio Chest Pe W/cm &/or Wo Cm  09/11/2015  CLINICAL DATA:  Chronic nonradiating chest pain, fever, shortness of breath, cough and mild abdominal pain. Initial encounter. EXAM: CT ANGIOGRAPHY CHEST WITH CONTRAST TECHNIQUE: Multidetector CT  imaging of the chest was performed using the standard protocol during bolus administration of intravenous contrast. Multiplanar CT image reconstructions and MIPs were obtained to evaluate the vascular anatomy. CONTRAST:  50mL OMNIPAQUE IOHEXOL 350 MG/ML SOLN COMPARISON:  Chest radiograph performed 09/10/2015, and CTA of the chest performed 04/10/2015 FINDINGS: There is no evidence of significant pulmonary embolus. Multiple nodular opacities are noted scattered throughout the lungs, many of which demonstrate central cavitation, most compatible with diffuse bilateral septic emboli. Underlying trace right-sided pleural fluid is noted. No pneumothorax is seen. Scattered coronary artery calcifications are seen. Prominent subcarinal and right hilar nodes are seen, measuring 1.6 cm and 1.2 cm in short axis. No pericardial effusion is identified. The great vessels are grossly unremarkable in appearance. Postoperative change is noted about the right atrium and right ventricle. No axillary lymphadenopathy is seen. The visualized portions of the thyroid gland are unremarkable in appearance. The visualized portions of the liver are unremarkable. The spleen is somewhat bulky, but only borderline prominent in length. Trace ascites is suggested noted tracking about the liver and spleen. No acute osseous abnormalities are seen. Review of the MIP images confirms the above findings. IMPRESSION: 1. No evidence of significant pulmonary embolus. 2. Multiple nodular opacities scattered throughout the lungs, many of which demonstrate central cavitation, most compatible with diffuse bilateral septic emboli, given the patient's history. 3. Underlying trace right-sided pleural fluid noted. 4. Prominent mediastinal and right hilar nodes noted, likely reflecting the underlying infection. 5. Scattered coronary artery calcifications seen. 6. Suggestion of trace ascites at the upper abdomen. These results were called by telephone at the time of  interpretation on 09/11/2015 at 2:49 am to Dr. Zadie Rhine, who verbally acknowledged these results. Electronically Signed   By: Roanna Raider M.D.   On: 09/11/2015 02:49   Dg Chest Port 1 View  09/16/2015  CLINICAL DATA:  Shortness of breath. EXAM: PORTABLE CHEST 1 VIEW COMPARISON:  09/12/2015.  CT 09/11/2015. FINDINGS: Heart size normal. Persistent nodular densities/infiltrates are again noted throughout both lungs. Cavitary nodular densities are noted on prior recent CT of 09/11/2015. No pleural effusion pneumothorax. Stable cardiomegaly. No acute bony abnormality. IMPRESSION: Persistent nodular opacities noted throughout both lungs. Similar findings noted on prior exams. Cavitary nodular infiltrates were noted on prior CT of 09/11/2015. Electronically Signed   By: Maisie Fus  Register   On: 09/16/2015 08:12   Dg Chest Portable 1 View  09/11/2015  CLINICAL DATA:  Sepsis, endocarditis, fever EXAM: PORTABLE CHEST 1 VIEW COMPARISON:  Portable exam 1548 hours compared to 09/10/2015 and correlated with chest CT of 09/11/2015 FINDINGS: Normal size of cardiac silhouette post TVR. Mediastinal contours and pulmonary vascularity normal. Lungs clear. No pleural effusion or pneumothorax. Bones unremarkable. IMPRESSION: No acute abnormalities. Electronically Signed   By: Ulyses Southward M.D.   On: 09/11/2015 15:58  Jeoffrey Massed, MD  [facility] Pager:[phone]  If 7PM-7AM, please contact night-coverage [email or web address] Password [email or web address] 09/16/2015, 2:05 PM   LOS: 5 days

## 2015-09-16 NOTE — Progress Notes (Signed)
Pt complaints of chest pain with labored breathing.  Gave pain medication, robaxin, and offered GI cocktail but pt refused.  Paged MD got order for one time dose of morphine.

## 2015-09-16 NOTE — Progress Notes (Signed)
Patient sitting calmly in room. Safety sitter at bedside; Patient verbally expressed no intentions of harming herself at this time. Suicidal precautions still in place. Will continue to monitor

## 2015-09-16 NOTE — Progress Notes (Signed)
Pharmacy Antibiotic Note Melanie Crane is a 40 y.o. female admitted on 09/10/2015 MRSA bacteremia in setting of recurrent prosthetic valve endocarditis.   4/6 Vancomycin trough = 15 (goal 15-20)  Plan: Continue gentimicin 70mg  q8h  Increase vancomycin to 1500mg  IV every 12 hours starting with next dose to ensure level stays in range   Height: 5\' 4"  (162.6 cm) Weight: 154 lb 12.2 oz (70.2 kg) IBW/kg (Calculated) : 54.7  Temp (24hrs), Avg:98.9 F (37.2 C), Min:98.4 F (36.9 C), Max:99.4 F (37.4 C)   Recent Labs Lab 09/11/15 0625 09/11/15 0948 09/11/15 1715 09/11/15 1730 09/11/15 2106 09/12/15 0130 09/13/15 0232 09/13/15 1619 09/14/15 1009 09/15/15 0530 09/16/15 0529 09/16/15 1542  WBC  --   --  10.1  --   --  6.5 8.6  --  7.7 8.8 10.4  --   CREATININE  --   --  0.97  --   --  0.90 0.90  --  0.90 0.71 0.82  --   LATICACIDVEN 2.1* 2.0 1.3 1.22 2.1*  --   --   --   --   --   --   --   VANCOTROUGH  --   --   --   --   --   --   --  29*  --   --   --  15  GENTTROUGH  --   --   --   --   --   --   --   --  0.7  --   --   --     Estimated Creatinine Clearance: 88.6 mL/min (by C-G formula based on Cr of 0.82).    No Known Allergies  Antimicrobials this admission: Vanc 4/1>> Rifampin 4/2 >> Gentamycin 4/3 >>   Cefazolin 4/2>> 4/3 Zosyn 4/1>>4/2 Cefepime 4/1 x 1 dose  Pertinent levels this admission: 4/3 VT 29 on 1 gram q8h and SCr 0.91 4/4 Gentamicin trough 0.7  On 70mg  IV q8h- continue  4/6 Vancomycin trough = 15 on 1250mg  IV q12h- incr to 1500mg  q12h  Microbiology results: 4/1 Blood - MRSA 4/1 MRSA PCR - POS 4/3 Blood x2 ngtd x3days 3/31 blood x2 NEG  Thank you for allowing pharmacy to be a part of this patient's care.  Phat Dalton D. Yaviel Kloster, PharmD, BCPS Clinical Pharmacist Pager: [phone] 09/16/2015 6:32 PM

## 2015-09-16 NOTE — Progress Notes (Signed)
Subjective:  C/o chest pain  Antibiotics:  Anti-infectives    Start     Dose/Rate Route Frequency Ordered Stop   09/14/15 0400  vancomycin (VANCOCIN) 1,250 mg in sodium chloride 0.9 % 250 mL IVPB     1,250 mg 166.7 mL/hr over 90 Minutes Intravenous Every 12 hours 09/13/15 1902     09/13/15 1000  gentamicin (GARAMYCIN) 70 mg in dextrose 5 % 50 mL IVPB     70 mg 103.5 mL/hr over 30 Minutes Intravenous Every 8 hours 09/13/15 0938     09/12/15 1415  rifampin (RIFADIN) 300 mg in sodium chloride 0.9 % 100 mL IVPB     300 mg 200 mL/hr over 30 Minutes Intravenous 3 times per day 09/12/15 1408     09/12/15 1100  ceFAZolin (ANCEF) IVPB 2g/100 mL premix  Status:  Discontinued     2 g 200 mL/hr over 30 Minutes Intravenous Every 8 hours 09/12/15 0957 09/13/15 0907   09/11/15 0900  vancomycin (VANCOCIN) IVPB 1000 mg/200 mL premix  Status:  Discontinued     1,000 mg 200 mL/hr over 60 Minutes Intravenous Every 8 hours 09/11/15 0644 09/13/15 1841   09/11/15 0645  piperacillin-tazobactam (ZOSYN) IVPB 3.375 g  Status:  Discontinued     3.375 g 12.5 mL/hr over 240 Minutes Intravenous 3 times per day 09/11/15 0644 09/12/15 0946   09/11/15 0100  ceFEPIme (MAXIPIME) 2 g in dextrose 5 % 50 mL IVPB     2 g 100 mL/hr over 30 Minutes Intravenous  Once 09/11/15 0056 09/11/15 0352   09/11/15 0045  vancomycin (VANCOCIN) IVPB 1000 mg/200 mL premix     1,000 mg 200 mL/hr over 60 Minutes Intravenous  Once 09/11/15 0040 09/11/15 0207      Medications: Scheduled Meds: . clonazePAM  1 mg Oral BID  . cloNIDine  0.1 mg Oral BID  . enoxaparin (LOVENOX) injection  40 mg Subcutaneous Q24H  . gentamicin  70 mg Intravenous Q8H  . magnesium oxide  400 mg Oral Daily  . metoprolol tartrate  25 mg Oral BID  . morphine  45 mg Oral Q12H  . pantoprazole  40 mg Oral Q1200  . polyethylene glycol  17 g Oral Daily  . rifampin (RIFADIN) IVPB  300 mg Intravenous 3 times per day  . senna  1 tablet Oral QHS  .  sodium chloride flush  3 mL Intravenous Q12H  . vancomycin  1,250 mg Intravenous Q12H   Continuous Infusions:  PRN Meds:.acetaminophen **OR** [DISCONTINUED] acetaminophen, bisacodyl, diphenoxylate-atropine, gi cocktail, methocarbamol, naLOXone (NARCAN)  injection, oxyCODONE, sodium phosphate    Objective: Weight change:   Intake/Output Summary (Last 24 hours) at 09/16/15 1659 Last data filed at 09/16/15 0934  Gross per 24 hour  Intake 1142.75 ml  Output      0 ml  Net 1142.75 ml   Blood pressure 125/67, pulse 94, temperature 98.4 F (36.9 C), temperature source Oral, resp. rate 20, height  (1.626 m), weight 154 lb 12.2 oz (70.2 kg), last menstrual period 04/12/2015, SpO2 97 %. Temp:  [98.3 F (36.8 C)-99.4 F (37.4 C)] 98.4 F (36.9 C) (04/06 0500) Pulse Rate:  [84-94] 94 (04/06 0500) Resp:  [19-20] 20 (04/06 0435) BP: (115-125)/(67-85) 125/67 mmHg (04/06 0500) SpO2:  [87 %-97 %] 97 % (04/06 0500)  Physical Exam: General: aox3 HEENT: anicteric sclera, EOMI, oropharynx clear and without exudate Cardiovascular: regular rate, normal r, i could not hear murmur with contact precaution  stethoscope Pulmonary: Fairly clear Gastrointestinal: soft nontender, nondistended, normal bowel sounds, Musculoskeletal: no clubbing or edema noted bilaterally Skin, soft tissue: Multiple areas where she has injected intravenous drugs no obvious abscess Neuro: nonfocal, strength and sensation intact  CBC:  CBC Latest Ref Rng 09/16/2015 09/15/2015 09/14/2015  WBC 4.0 - 10.5 K/uL 10.4 8.8 7.7  Hemoglobin 12.0 - 15.0 g/dL 10.4(L) 9.8(L) 10.5(L)  Hematocrit 36.0 - 46.0 % 33.3(L) 30.5(L) 33.9(L)  Platelets 150 - 400 K/uL 135(L) 141(L) 111(L)       BMET  Recent Labs  09/15/15 0530 09/16/15 0529  NA 138 137  K 5.2* 4.8  CL 106 102  CO2 20* 23  GLUCOSE 91 92  BUN 7 7  CREATININE 0.71 0.82  CALCIUM 8.0* 8.1*     Liver Panel   Recent Labs  09/14/15 1009 09/15/15 0530  PROT  6.9 6.9  ALBUMIN 2.2* 2.1*  AST 21 13*  ALT 10* 9*  ALKPHOS 69 59  BILITOT 0.7 0.8       Sedimentation Rate No results for input(s): ESRSEDRATE in the last 72 hours. C-Reactive Protein No results for input(s): CRP in the last 72 hours.  Micro Results: Recent Results (from the past 720 hour(s))  Blood culture (routine x 2)     Status: None   Collection Time: 09/10/15 11:50 PM  Result Value Ref Range Status   Specimen Description BLOOD RIGHT ARM  Final   Special Requests BOTTLES DRAWN AEROBIC AND ANAEROBIC  Final   Culture NO GROWTH 5 DAYS  Final   Report Status 09/16/2015 FINAL  Final  Blood culture (routine x 2)     Status: None   Collection Time: 09/10/15 11:55 PM  Result Value Ref Range Status   Specimen Description BLOOD RIGHT HAND  Final   Special Requests BOTTLES DRAWN AEROBIC AND ANAEROBIC  Final   Culture NO GROWTH 5 DAYS  Final   Report Status 09/16/2015 FINAL  Final  Culture, blood (single) w Reflex to ID Panel     Status: None   Collection Time: 09/11/15 12:40 AM  Result Value Ref Range Status   Specimen Description BLOOD RIGHT ARM  Final   Special Requests IN PEDIATRIC BOTTLE  Final   Culture  Setup Time   Final    GRAM POSITIVE COCCI IN CLUSTERS PEDIATRIC BOTTLE CALLED TO FLYNT,F RN 09/11/15 2046 WOOTEN,K CONFIRMED BY V WILKINS    Culture METHICILLIN RESISTANT STAPHYLOCOCCUS AUREUS  Final   Report Status 09/13/2015 FINAL  Final   Organism ID, Bacteria METHICILLIN RESISTANT STAPHYLOCOCCUS AUREUS  Final      Susceptibility   Methicillin resistant staphylococcus aureus - MIC*    CIPROFLOXACIN >=8 RESISTANT Resistant     ERYTHROMYCIN >=8 RESISTANT Resistant     GENTAMICIN <=0.5 SENSITIVE Sensitive     OXACILLIN >=4 RESISTANT Resistant     TETRACYCLINE <=1 SENSITIVE Sensitive     VANCOMYCIN 1 SENSITIVE Sensitive     TRIMETH/SULFA <=10 SENSITIVE Sensitive     CLINDAMYCIN <=0.25 SENSITIVE Sensitive     RIFAMPIN <=0.5 SENSITIVE Sensitive      Inducible Clindamycin NEGATIVE Sensitive     * METHICILLIN RESISTANT STAPHYLOCOCCUS AUREUS  MRSA PCR Screening     Status: Abnormal   Collection Time: 09/11/15  6:54 PM  Result Value Ref Range Status   MRSA by PCR POSITIVE (A) NEGATIVE Final    Comment:        The GeneXpert MRSA Assay (FDA approved for NASAL specimens only),  is one component of a comprehensive MRSA colonization surveillance program. It is not intended to diagnose MRSA infection nor to guide or monitor treatment for MRSA infections. RESULT CALLED TO, READ BACK BY AND VERIFIED WITH: T.CRITE,RN 09/11/15  BY V.WILKINS   Culture, blood (Routine X 2) w Reflex to ID Panel     Status: None (Preliminary result)   Collection Time: 09/13/15  4:05 PM  Result Value Ref Range Status   Specimen Description BLOOD LEFT ANTECUBITAL  Final   Special Requests IN PEDIATRIC BOTTLE 3CC  Final   Culture NO GROWTH 3 DAYS  Final   Report Status PENDING  Incomplete  Culture, blood (Routine X 2) w Reflex to ID Panel     Status: None (Preliminary result)   Collection Time: 09/13/15  4:15 PM  Result Value Ref Range Status   Specimen Description BLOOD LEFT ANTECUBITAL  Final   Special Requests IN PEDIATRIC BOTTLE 0.5CC  Final   Culture NO GROWTH 3 DAYS  Final   Report Status PENDING  Incomplete    Studies/Results: Dg Chest Port 1 View  09/16/2015  CLINICAL DATA:  Shortness of breath. EXAM: PORTABLE CHEST 1 VIEW COMPARISON:  09/12/2015.  CT 09/11/2015. FINDINGS: Heart size normal. Persistent nodular densities/infiltrates are again noted throughout both lungs. Cavitary nodular densities are noted on prior recent CT of 09/11/2015. No pleural effusion pneumothorax. Stable cardiomegaly. No acute bony abnormality. IMPRESSION: Persistent nodular opacities noted throughout both lungs. Similar findings noted on prior exams. Cavitary nodular infiltrates were noted on prior CT of 09/11/2015. Electronically Signed   By: Maisie Fus  Register   On:  09/16/2015 08:12      Assessment/Plan:  INTERVAL HISTORY:   09/11/15: blood cultures still + 09/12/15--09/14/15: patient sp IVC see chart 09/16/15: pt for TEE tomorrow  Principal Problem:   Narcotic dependence (HCC) Active Problems:   IV drug abuse   Anemia   Thrombocytopenia, unspecified (HCC)   Hepatitis C   Hypokalemia   Septic embolism (HCC)   Endocarditis   Prolonged Q-T interval on ECG   Acute endocarditis   Palliative care encounter   Respiratory failure (HCC)   Endocarditis of native valve   Staphylococcus aureus bacteremia with sepsis (HCC)   Acute renal failure (HCC)   Narcotic abuse   MRSA bacteremia   Dehydration    Melanie Crane is a 40 y.o. 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 Her transthoracic echocardiogram done here at [facility] suggest that she may potentially have a mitral valve vegetation as well.  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 worsening septic emboli to the lungs:  - TEE is planned --repeat blood cultures from the third 2017 are no growth in 24 hours --continue vanco/rifampin/gent --she needs to have clearance of blood cultures before placement of PICC or central line   #2 Persistent ongoing IV drug abuse with now her fourth episode of endocarditis after her heart valve replacement in 2015: I have a high discrete degree of skepticism about her ability to comply with therapy but maybe there is a way going forward greatly appreciate psychiatry's and palliative cares assistance along with coronal care of the primary team  #3 Possible suicidal ideation: IVC continues greatly appreciate psychiatry's help  #4 chronic hepatitis C without hepatic coma: This could be considered  as an outpatient if this patient were ever stable to  follow-up in the outpatient world which I have great skepticism about   #5 Worsening chest pain hyypoxia: likely due to septic emboli possibly with progression. Could consider noncontrast CT to avoid contrast toxicity with vanco/gentamicin. I think the TEE is most important along with clearance of her blood are the two most important pieces of information we will be obtaining.    LOS: 5 days   Melanie Crane 09/16/2015, 4:59 PM

## 2015-09-17 ENCOUNTER — Inpatient Hospital Stay (HOSPITAL_COMMUNITY): Payer: MEDICAID

## 2015-09-17 ENCOUNTER — Inpatient Hospital Stay (HOSPITAL_COMMUNITY): Payer: MEDICAID | Admitting: Anesthesiology

## 2015-09-17 ENCOUNTER — Encounter (HOSPITAL_COMMUNITY)
Admission: EM | Disposition: A | Discharge status: Skilled Nursing Facility | Payer: Self-pay | Source: Home / Self Care | Attending: Internal Medicine

## 2015-09-17 ENCOUNTER — Encounter (HOSPITAL_COMMUNITY): Payer: Self-pay | Admitting: *Deleted

## 2015-09-17 DIAGNOSIS — I288 Other diseases of pulmonary vessels: Secondary | ICD-10-CM

## 2015-09-17 DIAGNOSIS — I38 Endocarditis, valve unspecified: Secondary | ICD-10-CM

## 2015-09-17 HISTORY — PX: TEE WITHOUT CARDIOVERSION: SHX5443

## 2015-09-17 LAB — CBC
HCT: 36 % (ref 36.0–46.0)
HEMOGLOBIN: 11.4 g/dL — AB (ref 12.0–15.0)
MCH: 24.5 pg — AB (ref 26.0–34.0)
MCHC: 31.7 g/dL (ref 30.0–36.0)
MCV: 77.3 fL — ABNORMAL LOW (ref 78.0–100.0)
PLATELETS: 165 10*3/uL (ref 150–400)
RBC: 4.66 MIL/uL (ref 3.87–5.11)
RDW: 19.2 % — ABNORMAL HIGH (ref 11.5–15.5)
WBC: 16.1 10*3/uL — ABNORMAL HIGH (ref 4.0–10.5)

## 2015-09-17 LAB — SEROTONIN RELEASE ASSAY (SRA)
SRA .2 IU/mL UFH Ser-aCnc: 2 % (ref 0–20)
SRA 100IU/mL UFH Ser-aCnc: 1 % (ref 0–20)

## 2015-09-17 SURGERY — ECHOCARDIOGRAM, TRANSESOPHAGEAL
Anesthesia: Moderate Sedation

## 2015-09-17 SURGERY — ECHOCARDIOGRAM, TRANSESOPHAGEAL
Anesthesia: Monitor Anesthesia Care

## 2015-09-17 MED ORDER — LIDOCAINE VISCOUS 2 % MT SOLN
OROMUCOSAL | Status: AC
  Filled 2015-09-17: qty 15

## 2015-09-17 MED ORDER — MORPHINE SULFATE ER 30 MG PO TBCR
60.0000 mg | EXTENDED_RELEASE_TABLET | Freq: Two times a day (BID) | ORAL | Status: DC
  Administered 2015-09-17 – 2015-09-18 (×2): 60 mg via ORAL
  Filled 2015-09-17 (×2): qty 2

## 2015-09-17 MED ORDER — CLONIDINE HCL 0.1 MG PO TABS
0.1000 mg | ORAL_TABLET | Freq: Every day | ORAL | Status: DC
  Administered 2015-09-18: 0.1 mg via ORAL
  Filled 2015-09-17: qty 1

## 2015-09-17 MED ORDER — PHENYLEPHRINE HCL 10 MG/ML IJ SOLN
INTRAMUSCULAR | Status: DC | PRN
  Administered 2015-09-17: 80 ug via INTRAVENOUS
  Administered 2015-09-17: 40 ug via INTRAVENOUS

## 2015-09-17 MED ORDER — PROPOFOL 500 MG/50ML IV EMUL
INTRAVENOUS | Status: DC | PRN
  Administered 2015-09-17: 100 ug/kg/min via INTRAVENOUS

## 2015-09-17 MED ORDER — SODIUM CHLORIDE 0.9 % IV SOLN
INTRAVENOUS | Status: DC
  Administered 2015-09-17: 08:00:00 via INTRAVENOUS

## 2015-09-17 MED ORDER — MORPHINE SULFATE ER 15 MG PO TBCR
15.0000 mg | EXTENDED_RELEASE_TABLET | Freq: Once | ORAL | Status: AC
  Administered 2015-09-17: 15 mg via ORAL
  Filled 2015-09-17: qty 1

## 2015-09-17 MED ORDER — LIDOCAINE VISCOUS 2 % MT SOLN
OROMUCOSAL | Status: DC | PRN
  Administered 2015-09-17: 15 mL via OROMUCOSAL

## 2015-09-17 MED ORDER — PROPOFOL 10 MG/ML IV BOLUS
INTRAVENOUS | Status: DC | PRN
  Administered 2015-09-17: 20 mg via INTRAVENOUS
  Administered 2015-09-17 (×2): 10 mg via INTRAVENOUS

## 2015-09-17 NOTE — Op Note (Signed)
TV prosthesis with large vegetation along atrial side of leaflets  Measures 2 x 1 cm  Shaggy There is severe TR.    MV is mildly thickened  Trace MR  AV is mildy thickened  Trivial AI  PV is normal  Patent foramen ovale with continuous bidirectional flow  INteratrial septum bulges to L consistent with increased R atrial pressures  LVEF is normal    RV function appears mildly depressed  Mild fixed atherosclerotic plaquing in the thoracic aorta.    Full report to follow

## 2015-09-17 NOTE — Progress Notes (Signed)
Subjective:  Less chest pain today  Antibiotics:  Anti-infectives    Start     Dose/Rate Route Frequency Ordered Stop   09/17/15 0500  vancomycin (VANCOCIN) 1,500 mg in sodium chloride 0.9 % 500 mL IVPB     1,500 mg 250 mL/hr over 120 Minutes Intravenous Every 12 hours 09/16/15 1833     09/14/15 0400  vancomycin (VANCOCIN) 1,250 mg in sodium chloride 0.9 % 250 mL IVPB  Status:  Discontinued     1,250 mg 166.7 mL/hr over 90 Minutes Intravenous Every 12 hours 09/13/15 1902 09/16/15 1833   09/13/15 1000  gentamicin (GARAMYCIN) 70 mg in dextrose 5 % 50 mL IVPB     70 mg 103.5 mL/hr over 30 Minutes Intravenous Every 8 hours 09/13/15 0938     09/12/15 1415  rifampin (RIFADIN) 300 mg in sodium chloride 0.9 % 100 mL IVPB     300 mg 200 mL/hr over 30 Minutes Intravenous 3 times per day 09/12/15 1408     09/12/15 1100  ceFAZolin (ANCEF) IVPB 2g/100 mL premix  Status:  Discontinued     2 g 200 mL/hr over 30 Minutes Intravenous Every 8 hours 09/12/15 0957 09/13/15 0907   09/11/15 0900  vancomycin (VANCOCIN) IVPB 1000 mg/200 mL premix  Status:  Discontinued     1,000 mg 200 mL/hr over 60 Minutes Intravenous Every 8 hours 09/11/15 0644 09/13/15 1841   09/11/15 0645  piperacillin-tazobactam (ZOSYN) IVPB 3.375 g  Status:  Discontinued     3.375 g 12.5 mL/hr over 240 Minutes Intravenous 3 times per day 09/11/15 0644 09/12/15 0946   09/11/15 0100  ceFEPIme (MAXIPIME) 2 g in dextrose 5 % 50 mL IVPB     2 g 100 mL/hr over 30 Minutes Intravenous  Once 09/11/15 0056 09/11/15 0352   09/11/15 0045  vancomycin (VANCOCIN) IVPB 1000 mg/200 mL premix     1,000 mg 200 mL/hr over 60 Minutes Intravenous  Once 09/11/15 0040 09/11/15 0207      Medications: Scheduled Meds: . clonazePAM  1 mg Oral BID  . [START ON 09/18/2015] cloNIDine  0.1 mg Oral Daily  . enoxaparin (LOVENOX) injection  40 mg Subcutaneous Q24H  . gentamicin  70 mg Intravenous Q8H  . magnesium oxide  400 mg Oral Daily  .  metoprolol tartrate  25 mg Oral BID  . morphine  60 mg Oral Q12H  . pantoprazole  40 mg Oral Q1200  . polyethylene glycol  17 g Oral Daily  . rifampin (RIFADIN) IVPB  300 mg Intravenous 3 times per day  . senna  1 tablet Oral QHS  . sodium chloride flush  3 mL Intravenous Q12H  . vancomycin  1,500 mg Intravenous Q12H   Continuous Infusions:  PRN Meds:.acetaminophen **OR** [DISCONTINUED] acetaminophen, bisacodyl, diphenoxylate-atropine, gi cocktail, methocarbamol, naLOXone (NARCAN)  injection, oxyCODONE, sodium phosphate    Objective: Weight change:   Intake/Output Summary (Last 24 hours) at 09/17/15 1639 Last data filed at 09/17/15 1616  Gross per 24 hour  Intake   1497 ml  Output    500 ml  Net    997 ml   Blood pressure 89/49, pulse 95, temperature 99.3 F (37.4 C), temperature source Oral, resp. rate 42, height 5\' 4"  (1.626 m), weight 154 lb 12.2 oz (70.2 kg), last menstrual period 04/12/2015, SpO2 94 %. Temp:  [99.3 F (37.4 C)-100.5 F (38.1 C)] 99.3 F (37.4 C) (04/07 0721) Pulse Rate:  [92-126] 95 (04/07 0925) Resp:  [  15-42] 42 (04/07 0925) BP: (70-116)/(40-86) 89/49 mmHg (04/07 0925) SpO2:  [90 %-94 %] 94 % (04/07 0925)  Physical Exam: General: aox3 HEENT: anicteric sclera, EOMI, oropharynx clear and without exudate Cardiovascular: regular rate, normal r, i could not hear murmur with contact precaution stethoscope Pulmonary: Fairly clear Gastrointestinal: soft nontender, nondistended, normal bowel sounds, Musculoskeletal: no clubbing or edema noted bilaterally Skin, soft tissue: Multiple areas where she has injected intravenous drugs no obvious abscess Neuro: nonfocal, strength and sensation intact  CBC:  CBC Latest Ref Rng 09/17/2015 09/16/2015 09/15/2015  WBC 4.0 - 10.5 K/uL 16.1(H) 10.4 8.8  Hemoglobin 12.0 - 15.0 g/dL 11.4(L) 10.4(L) 9.8(L)  Hematocrit 36.0 - 46.0 % 36.0 33.3(L) 30.5(L)  Platelets 150 - 400 K/uL 165 135(L) 141(L)       BMET  Recent  Labs  09/15/15 0530 09/16/15 0529  NA 138 137  K 5.2* 4.8  CL 106 102  CO2 20* 23  GLUCOSE 91 92  BUN 7 7  CREATININE 0.71 0.82  CALCIUM 8.0* 8.1*     Liver Panel   Recent Labs  09/15/15 0530  PROT 6.9  ALBUMIN 2.1*  AST 13*  ALT 9*  ALKPHOS 59  BILITOT 0.8       Sedimentation Rate No results for input(s): ESRSEDRATE in the last 72 hours. C-Reactive Protein No results for input(s): CRP in the last 72 hours.  Micro Results: Recent Results (from the past 720 hour(s))  Blood culture (routine x 2)     Status: None   Collection Time: 09/10/15 11:50 PM  Result Value Ref Range Status   Specimen Description BLOOD RIGHT ARM  Final   Special Requests BOTTLES DRAWN AEROBIC AND ANAEROBIC 5ML  Final   Culture NO GROWTH 5 DAYS  Final   Report Status 09/16/2015 FINAL  Final  Blood culture (routine x 2)     Status: None   Collection Time: 09/10/15 11:55 PM  Result Value Ref Range Status   Specimen Description BLOOD RIGHT HAND  Final   Special Requests BOTTLES DRAWN AEROBIC AND ANAEROBIC 5ML  Final   Culture NO GROWTH 5 DAYS  Final   Report Status 09/16/2015 FINAL  Final  Culture, blood (single) w Reflex to ID Panel     Status: None   Collection Time: 09/11/15 12:40 AM  Result Value Ref Range Status   Specimen Description BLOOD RIGHT ARM  Final   Special Requests IN PEDIATRIC BOTTLE 3ML  Final   Culture  Setup Time   Final    GRAM POSITIVE COCCI IN CLUSTERS PEDIATRIC BOTTLE CALLED TO FLYNT,F RN 09/11/15 2046 WOOTEN,K CONFIRMED BY V WILKINS    Culture METHICILLIN RESISTANT STAPHYLOCOCCUS AUREUS  Final   Report Status 09/13/2015 FINAL  Final   Organism ID, Bacteria METHICILLIN RESISTANT STAPHYLOCOCCUS AUREUS  Final      Susceptibility   Methicillin resistant staphylococcus aureus - MIC*    CIPROFLOXACIN >=8 RESISTANT Resistant     ERYTHROMYCIN >=8 RESISTANT Resistant     GENTAMICIN <=0.5 SENSITIVE Sensitive     OXACILLIN >=4 RESISTANT Resistant     TETRACYCLINE <=1  SENSITIVE Sensitive     VANCOMYCIN 1 SENSITIVE Sensitive     TRIMETH/SULFA <=10 SENSITIVE Sensitive     CLINDAMYCIN <=0.25 SENSITIVE Sensitive     RIFAMPIN <=0.5 SENSITIVE Sensitive     Inducible Clindamycin NEGATIVE Sensitive     * METHICILLIN RESISTANT STAPHYLOCOCCUS AUREUS  MRSA PCR Screening     Status: Abnormal   Collection Time: 09/11/15  6:54 PM  Result Value Ref Range Status   MRSA by PCR POSITIVE (A) NEGATIVE Final    Comment:        The GeneXpert MRSA Assay (FDA approved for NASAL specimens only), is one component of a comprehensive MRSA colonization surveillance program. It is not intended to diagnose MRSA infection nor to guide or monitor treatment for MRSA infections. RESULT CALLED TO, READ BACK BY AND VERIFIED WITH: T.CRITE,RN 09/11/15  BY V.WILKINS   Culture, blood (Routine X 2) w Reflex to ID Panel     Status: None (Preliminary result)   Collection Time: 09/13/15  4:05 PM  Result Value Ref Range Status   Specimen Description BLOOD LEFT ANTECUBITAL  Final   Special Requests IN PEDIATRIC BOTTLE 3CC  Final   Culture NO GROWTH 4 DAYS  Final   Report Status PENDING  Incomplete  Culture, blood (Routine X 2) w Reflex to ID Panel     Status: None (Preliminary result)   Collection Time: 09/13/15  4:15 PM  Result Value Ref Range Status   Specimen Description BLOOD LEFT ANTECUBITAL  Final   Special Requests IN PEDIATRIC BOTTLE 0.5CC  Final   Culture NO GROWTH 4 DAYS  Final   Report Status PENDING  Incomplete    Studies/Results: Dg Chest Port 1 View  09/16/2015  CLINICAL DATA:  Shortness of breath. EXAM: PORTABLE CHEST 1 VIEW COMPARISON:  09/12/2015.  CT 09/11/2015. FINDINGS: Heart size normal. Persistent nodular densities/infiltrates are again noted throughout both lungs. Cavitary nodular densities are noted on prior recent CT of 09/11/2015. No pleural effusion pneumothorax. Stable cardiomegaly. No acute bony abnormality. IMPRESSION: Persistent nodular opacities  noted throughout both lungs. Similar findings noted on prior exams. Cavitary nodular infiltrates were noted on prior CT of 09/11/2015. Electronically Signed   By: Maisie Fus  Register   On: 09/16/2015 08:12      Assessment/Plan:  INTERVAL HISTORY:   09/11/15: blood cultures still + 09/12/15--09/14/15: patient sp IVC see chart 09/16/15: pt for TEE tomorrow  09/17/2015: TEE shows a large shaggy vegetation on the tricuspid valve with a patent foreman ovale but no vegetation on the left side of the heart  Principal Problem:   Narcotic dependence (HCC) Active Problems:   IV drug abuse   Anemia   Thrombocytopenia, unspecified (HCC)   Hepatitis C   Hypokalemia   Septic embolism (HCC)   Endocarditis   Prolonged Q-T interval on ECG   Acute endocarditis   Palliative care encounter   Respiratory failure (HCC)   Endocarditis of native valve   Staphylococcus aureus bacteremia with sepsis (HCC)   Acute renal failure (HCC)   Narcotic abuse   MRSA bacteremia   Dehydration    Melanie Crane is a 40 y.o. 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 Her transthoracic echocardi[facility] done [facility] at [facility] suggest that she may potentially have a mitral valve vegetation as well. TEE does not show this but does show large shaggy vegetation on TV with PFO.  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 worsening septic emboli to the lungs and now LARGE SHAGGY TV VEGETATION WITH PFO  --she needs a formal CVTS consult though I would expect them not to operate --repeat blood cultures from the third 2017 are no growth in 4 days --continue vanco/rifampin/gent --  she needs to have clearance of blood cultures before placement of PICC or central line  --from abx standpoint she should receive  2 weeks of  IV gentamicin from date of negative cultures --> stop date 09/27/15 6 weeks of IV vancomycin and rifampin "" --> 10/25/15   #2 Persistent ongoing IV drug abuse with now her fourth episode of endocarditis after her heart valve replacement in 2015: I have a high discrete degree of skepticism about her ability to comply with therapy but maybe there is a way going forward greatly appreciate psychiatry's and palliative cares assistance along with coronal care of the primary team  #3 Possible suicidal ideation: IVC continues greatly appreciate psychiatry's help  #4 chronic hepatitis C without hepatic coma: This could be considered as an outpatient if this patient were ever stable to follow-up in the outpatient world which I have great skepticism about   #5 Worsening chest pain hyypoxia: likely due to septic emboli possibly with progression.   Dr Luciana Axe is available for questions this weekend. Dr. Orvan Falconer covering Monday through Wednesday next week and I will be back on Thursday.   LOS: 6 days   Acey Lav 09/17/2015, 4:39 PM

## 2015-09-17 NOTE — Transfer of Care (Signed)
Immediate Anesthesia Transfer of Care Note  Patient: Melanie Crane  Procedure(s) Performed: Procedure(s): TRANSESOPHAGEAL ECHOCARDIOGRAM (TEE) (N/A)  Patient Location: Endoscopy Unit  Anesthesia Type:MAC  Level of Consciousness: awake and alert   Airway & Oxygen Therapy: Patient Spontanous Breathing and Patient connected to nasal cannula oxygen  Post-op Assessment: Report given to RN, Post -op Vital signs reviewed and stable and Patient moving all extremities  Post vital signs: Reviewed and stable  Last Vitals:  Filed Vitals:   09/17/15 0858 09/17/15 0902  BP: 70/40 86/48  Pulse: 94 96  Temp:    Resp: 29 34    Complications: No apparent anesthesia complications

## 2015-09-17 NOTE — [place] FL2 (Signed)
[place] MEDICAID FL2 LEVEL OF CARE SCREENING TOOL     IDENTIFICATION  Patient Name: Melanie RavelingJoann Theresa Brereton Birthdate: 10/23/1975 Sex: female Admission Date (Current Location): 09/10/2015  [facility] [place]   Ignacia Palma([place])   Facility and Address:  [facility] [place], [address], [place], [place] [address]      Provider Number: [id number]  Attending Physician Name and Address:  Maretta BeesShanker M Ghimire, MD  Relative Name and Phone Number:       Current Level of Care: Hospital Recommended Level of Care: Skilled Nursing Facility Prior Approval Number:    Date Approved/Denied:   PASRR Number:    Discharge Plan:      Current Diagnoses: Patient Active Problem List   Diagnosis Date Noted  . Dehydration   . MRSA bacteremia   . Respiratory failure (HCC)   . Endocarditis of native valve   . Staphylococcus aureus bacteremia with sepsis (HCC)   . Acute renal failure (HCC)   . Narcotic abuse   . Prolonged Q-T interval on ECG 09/11/2015  . Normocytic anemia   . Acute endocarditis   . Narcotic dependence (HCC)   . Palliative care encounter   . Pain in the chest   . Other iron deficiency anemias   . Acute septic pulmonary embolism without acute cor pulmonale (HCC)   . Sepsis (HCC)   . Prosthetic valve endocarditis (HCC)   . Embolism, pulmonary with infarction (HCC) 04/11/2015  . Endocarditis 04/11/2015  . Abscess of right lung with pneumonia (HCC)   . Pulmonary embolism (HCC) 04/10/2015  . Septic embolism (HCC) 04/10/2015  . Lung abscess (HCC) 04/10/2015  . PE (pulmonary embolism) 04/10/2015  . Hepatitis C 12/30/2013  . Hypokalemia 12/30/2013  . UTI (urinary tract infection) 12/30/2013  . Acute bacterial endocarditis - right sided 12/29/2013  . IV drug abuse 12/29/2013  . Anemia 12/29/2013  . Thrombocytopenia, unspecified (HCC) 12/29/2013  . Cigarette smoker 12/29/2013  . Shock (HCC) 12/28/2013  . Secondary amenorrhea 12/28/2013  . Bacterial  myositis 12/28/2013    Orientation RESPIRATION BLADDER Height & Weight     Self, Time, Situation, Place  O2 (2L [place]) Continent Weight: 154 lb 12.2 oz (70.2 kg) Height:  5\' 4"  (162.6 cm)  BEHAVIORAL SYMPTOMS/MOOD NEUROLOGICAL BOWEL NUTRITION STATUS      Continent Diet (regular)  AMBULATORY STATUS COMMUNICATION OF NEEDS Skin   Independent Verbally Normal                       Personal Care Assistance Level of Assistance  Bathing, Dressing, Feeding Bathing Assistance: Independent Feeding assistance: Independent Dressing Assistance: Independent     Functional Limitations Info             SPECIAL CARE FACTORS FREQUENCY   (long term IV antibiotics)                    Contractures      Additional Factors Info  Code Status, Allergies, Psychotropic, Isolation Precautions Code Status Info: FULL Allergies Info: NKA Psychotropic Info: klonopin   Isolation Precautions Info: MRSA     Current Medications (09/17/2015):  This is the current hospital active medication list Current Facility-Administered Medications  Medication Dose Route Frequency Provider Last Rate Last Dose  . acetaminophen (TYLENOL) tablet 650 mg  650 mg Oral Q6H PRN Briscoe Deutscherimothy S Opyd, MD      . bisacodyl (DULCOLAX) EC tablet 5 mg  5 mg Oral Daily PRN Lavone Neriimothy S  Opyd, MD      . clonazePAM Scarlette Calico) tablet 1 mg  1 mg Oral BID Maretta Bees, MD   1 mg at 09/17/15 0958  . [START ON 09/18/2015] cloNIDine (CATAPRES) tablet 0.1 mg  0.1 mg Oral Daily Shanker Levora Dredge, MD      . diphenoxylate-atropine (LOMOTIL) 2.5-0.025 MG per tablet 1 tablet  1 tablet Oral QID PRN Rhetta Mura, MD      . enoxaparin (LOVENOX) injection 40 mg  40 mg Subcutaneous Q24H Maretta Bees, MD   40 mg at 09/16/15 2039  . gentamicin (GARAMYCIN) 70 mg in dextrose 5 % 50 mL IVPB  70 mg Intravenous [address], RPH   70 mg at 09/17/15 0959  . gi cocktail (Maalox,Lidocaine,Donnatal)  30 mL Oral TID PRN Maretta Bees, MD    30 mL at 09/15/15 1739  . magnesium oxide (MAG-OX) tablet 400 mg  400 mg Oral Daily Rhetta Mura, MD   400 mg at 09/17/15 0959  . methocarbamol (ROBAXIN) tablet 500 mg  500 mg Oral Q6H PRN Rhetta Mura, MD   500 mg at 09/16/15 1631  . metoprolol tartrate (LOPRESSOR) tablet 25 mg  25 mg Oral BID Maretta Bees, MD   25 mg at 09/17/15 0958  . morphine (MS CONTIN) 12 hr tablet 60 mg  60 mg Oral Q12H Shanker Levora Dredge, MD      . naloxone [facility]) injection 0.4 mg  0.4 mg Intravenous PRN Maretta Bees, MD      . oxyCODONE (Oxy IR/ROXICODONE) immediate release tablet 15-20 mg  15-20 mg Oral Q4H PRN Maretta Bees, MD   20 mg at 09/17/15 1345  . pantoprazole (PROTONIX) EC tablet 40 mg  40 mg Oral Q1200 Maretta Bees, MD   40 mg at 09/17/15 1151  . polyethylene glycol (MIRALAX / GLYCOLAX) packet 17 g  17 g Oral Daily Maretta Bees, MD   17 g at 09/15/15 1700  . rifampin (RIFADIN) 300 mg in sodium chloride 0.9 % 100 mL IVPB  300 mg Intravenous 3 times per day Randall Hiss, MD   300 mg at 09/17/15 1336  . senna (SENOKOT) tablet 8.6 mg  1 tablet Oral QHS Maretta Bees, MD   8.6 mg at 09/16/15 2039  . sodium chloride flush (NS) 0.9 % injection 3 mL  3 mL Intravenous Q12H Lavone Neri Opyd, MD   3 mL at 09/17/15 1000  . sodium phosphate (FLEET) 7-19 GM/118ML enema 1 enema  1 enema Rectal Daily PRN Maretta Bees, MD      . vancomycin (VANCOCIN) 1,500 mg in sodium chloride 0.9 % 500 mL IVPB  1,500 mg Intravenous Q12H Lauren D Bajbus, RPH   1,500 mg at 09/17/15 0410     Discharge Medications: Please see discharge summary for a list of discharge medications.  Relevant Imaging Results:  Relevant Lab Results:   Additional Information SS#: [id number], pt will be on 2weeks gentamicin and 6weeks of rifampin and vancomycin  [place], Binnie Rail, LCSW

## 2015-09-17 NOTE — Anesthesia Procedure Notes (Signed)
Procedure Name: MAC Date/Time: 09/17/2015 8:25 AM Performed by: Edmonia CaprioAUSTON, Taleigha Pinson M Pre-anesthesia Checklist: Patient identified, Timeout performed, Emergency Drugs available, Suction available and Patient being monitored Patient Re-evaluated:Patient Re-evaluated prior to inductionOxygen Delivery Method: Nasal cannula Intubation Type: IV induction

## 2015-09-17 NOTE — H&P (View-Only) (Signed)
      Subjective:  C/o chest pain  Antibiotics:  Anti-infectives    Start     Dose/Rate Route Frequency Ordered Stop   09/14/15 0400  vancomycin (VANCOCIN) 1,250 mg in sodium chloride 0.9 % 250 mL IVPB     1,250 mg 166.7 mL/hr over 90 Minutes Intravenous Every 12 hours 09/13/15 1902     09/13/15 1000  gentamicin (GARAMYCIN) 70 mg in dextrose 5 % 50 mL IVPB     70 mg 103.5 mL/hr over 30 Minutes Intravenous Every 8 hours 09/13/15 0938     09/12/15 1415  rifampin (RIFADIN) 300 mg in sodium chloride 0.9 % 100 mL IVPB     300 mg 200 mL/hr over 30 Minutes Intravenous 3 times per day 09/12/15 1408     09/12/15 1100  ceFAZolin (ANCEF) IVPB 2g/100 mL premix  Status:  Discontinued     2 g 200 mL/hr over 30 Minutes Intravenous Every 8 hours 09/12/15 0957 09/13/15 0907   09/11/15 0900  vancomycin (VANCOCIN) IVPB 1000 mg/200 mL premix  Status:  Discontinued     1,000 mg 200 mL/hr over 60 Minutes Intravenous Every 8 hours 09/11/15 0644 09/13/15 1841   09/11/15 0645  piperacillin-tazobactam (ZOSYN) IVPB 3.375 g  Status:  Discontinued     3.375 g 12.5 mL/hr over 240 Minutes Intravenous 3 times per day 09/11/15 0644 09/12/15 0946   09/11/15 0100  ceFEPIme (MAXIPIME) 2 g in dextrose 5 % 50 mL IVPB     2 g 100 mL/hr over 30 Minutes Intravenous  Once 09/11/15 0056 09/11/15 0352   09/11/15 0045  vancomycin (VANCOCIN) IVPB 1000 mg/200 mL premix     1,000 mg 200 mL/hr over 60 Minutes Intravenous  Once 09/11/15 0040 09/11/15 0207      Medications: Scheduled Meds: . clonazePAM  1 mg Oral BID  . cloNIDine  0.1 mg Oral BID  . enoxaparin (LOVENOX) injection  40 mg Subcutaneous Q24H  . gentamicin  70 mg Intravenous Q8H  . magnesium oxide  400 mg Oral Daily  . metoprolol tartrate  25 mg Oral BID  . morphine  45 mg Oral Q12H  . pantoprazole  40 mg Oral Q1200  . polyethylene glycol  17 g Oral Daily  . rifampin (RIFADIN) IVPB  300 mg Intravenous 3 times per day  . senna  1 tablet Oral QHS  .  sodium chloride flush  3 mL Intravenous Q12H  . vancomycin  1,250 mg Intravenous Q12H   Continuous Infusions:  PRN Meds:.acetaminophen **OR** [DISCONTINUED] acetaminophen, bisacodyl, diphenoxylate-atropine, gi cocktail, methocarbamol, naLOXone (NARCAN)  injection, oxyCODONE, sodium phosphate    Objective: Weight change:   Intake/Output Summary (Last 24 hours) at 09/16/15 1659 Last data filed at 09/16/15 0934  Gross per 24 hour  Intake 1142.75 ml  Output      0 ml  Net 1142.75 ml   Blood pressure 125/67, pulse 94, temperature 98.4 F (36.9 C), temperature source Oral, resp. rate 20, height 5' 4" (1.626 m), weight 154 lb 12.2 oz (70.2 kg), last menstrual period 04/12/2015, SpO2 97 %. Temp:  [98.3 F (36.8 C)-99.4 F (37.4 C)] 98.4 F (36.9 C) (04/06 0500) Pulse Rate:  [84-94] 94 (04/06 0500) Resp:  [19-20] 20 (04/06 0435) BP: (115-125)/(67-85) 125/67 mmHg (04/06 0500) SpO2:  [87 %-97 %] 97 % (04/06 0500)  Physical Exam: General: aox3 HEENT: anicteric sclera, EOMI, oropharynx clear and without exudate Cardiovascular: regular rate, normal r, i could not hear murmur with contact precaution   stethoscope Pulmonary: Fairly clear Gastrointestinal: soft nontender, nondistended, normal bowel sounds, Musculoskeletal: no clubbing or edema noted bilaterally Skin, soft tissue: Multiple areas where she has injected intravenous drugs no obvious abscess Neuro: nonfocal, strength and sensation intact  CBC:  CBC Latest Ref Rng 09/16/2015 09/15/2015 09/14/2015  WBC 4.0 - 10.5 K/uL 10.4 8.8 7.7  Hemoglobin 12.0 - 15.0 g/dL 10.4(L) 9.8(L) 10.5(L)  Hematocrit 36.0 - 46.0 % 33.3(L) 30.5(L) 33.9(L)  Platelets 150 - 400 K/uL 135(L) 141(L) 111(L)       BMET  Recent Labs  09/15/15 0530 09/16/15 0529  NA 138 137  K 5.2* 4.8  CL 106 102  CO2 20* 23  GLUCOSE 91 92  BUN 7 7  CREATININE 0.71 0.82  CALCIUM 8.0* 8.1*     Liver Panel   Recent Labs  09/14/15 1009 09/15/15 0530  PROT  6.9 6.9  ALBUMIN 2.2* 2.1*  AST 21 13*  ALT 10* 9*  ALKPHOS 69 59  BILITOT 0.7 0.8       Sedimentation Rate No results for input(s): ESRSEDRATE in the last 72 hours. C-Reactive Protein No results for input(s): CRP in the last 72 hours.  Micro Results: Recent Results (from the past 720 hour(s))  Blood culture (routine x 2)     Status: None   Collection Time: 09/10/15 11:50 PM  Result Value Ref Range Status   Specimen Description BLOOD RIGHT ARM  Final   Special Requests BOTTLES DRAWN AEROBIC AND ANAEROBIC 5ML  Final   Culture NO GROWTH 5 DAYS  Final   Report Status 09/16/2015 FINAL  Final  Blood culture (routine x 2)     Status: None   Collection Time: 09/10/15 11:55 PM  Result Value Ref Range Status   Specimen Description BLOOD RIGHT HAND  Final   Special Requests BOTTLES DRAWN AEROBIC AND ANAEROBIC 5ML  Final   Culture NO GROWTH 5 DAYS  Final   Report Status 09/16/2015 FINAL  Final  Culture, blood (single) w Reflex to ID Panel     Status: None   Collection Time: 09/11/15 12:40 AM  Result Value Ref Range Status   Specimen Description BLOOD RIGHT ARM  Final   Special Requests IN PEDIATRIC BOTTLE 3ML  Final   Culture  Setup Time   Final    GRAM POSITIVE COCCI IN CLUSTERS PEDIATRIC BOTTLE CALLED TO FLYNT,F RN 09/11/15 2046 WOOTEN,K CONFIRMED BY V WILKINS    Culture METHICILLIN RESISTANT STAPHYLOCOCCUS AUREUS  Final   Report Status 09/13/2015 FINAL  Final   Organism ID, Bacteria METHICILLIN RESISTANT STAPHYLOCOCCUS AUREUS  Final      Susceptibility   Methicillin resistant staphylococcus aureus - MIC*    CIPROFLOXACIN >=8 RESISTANT Resistant     ERYTHROMYCIN >=8 RESISTANT Resistant     GENTAMICIN <=0.5 SENSITIVE Sensitive     OXACILLIN >=4 RESISTANT Resistant     TETRACYCLINE <=1 SENSITIVE Sensitive     VANCOMYCIN 1 SENSITIVE Sensitive     TRIMETH/SULFA <=10 SENSITIVE Sensitive     CLINDAMYCIN <=0.25 SENSITIVE Sensitive     RIFAMPIN <=0.5 SENSITIVE Sensitive      Inducible Clindamycin NEGATIVE Sensitive     * METHICILLIN RESISTANT STAPHYLOCOCCUS AUREUS  MRSA PCR Screening     Status: Abnormal   Collection Time: 09/11/15  6:54 PM  Result Value Ref Range Status   MRSA by PCR POSITIVE (A) NEGATIVE Final    Comment:        The GeneXpert MRSA Assay (FDA approved for NASAL specimens only),   is one component of a comprehensive MRSA colonization surveillance program. It is not intended to diagnose MRSA infection nor to guide or monitor treatment for MRSA infections. RESULT CALLED TO, READ BACK BY AND VERIFIED WITH: T.CRITE,RN 09/11/15 @2042 BY V.WILKINS   Culture, blood (Routine X 2) w Reflex to ID Panel     Status: None (Preliminary result)   Collection Time: 09/13/15  4:05 PM  Result Value Ref Range Status   Specimen Description BLOOD LEFT ANTECUBITAL  Final   Special Requests IN PEDIATRIC BOTTLE 3CC  Final   Culture NO GROWTH 3 DAYS  Final   Report Status PENDING  Incomplete  Culture, blood (Routine X 2) w Reflex to ID Panel     Status: None (Preliminary result)   Collection Time: 09/13/15  4:15 PM  Result Value Ref Range Status   Specimen Description BLOOD LEFT ANTECUBITAL  Final   Special Requests IN PEDIATRIC BOTTLE 0.5CC  Final   Culture NO GROWTH 3 DAYS  Final   Report Status PENDING  Incomplete    Studies/Results: Dg Chest Port 1 View  09/16/2015  CLINICAL DATA:  Shortness of breath. EXAM: PORTABLE CHEST 1 VIEW COMPARISON:  09/12/2015.  CT 09/11/2015. FINDINGS: Heart size normal. Persistent nodular densities/infiltrates are again noted throughout both lungs. Cavitary nodular densities are noted on prior recent CT of 09/11/2015. No pleural effusion pneumothorax. Stable cardiomegaly. No acute bony abnormality. IMPRESSION: Persistent nodular opacities noted throughout both lungs. Similar findings noted on prior exams. Cavitary nodular infiltrates were noted on prior CT of 09/11/2015. Electronically Signed   By: Thomas  Register   On:  09/16/2015 08:12      Assessment/Plan:  INTERVAL HISTORY:   09/11/15: blood cultures still + 09/12/15--09/14/15: patient sp IVC see chart 09/16/15: pt for TEE tomorrow  Principal Problem:   Narcotic dependence (HCC) Active Problems:   IV drug abuse   Anemia   Thrombocytopenia, unspecified (HCC)   Hepatitis C   Hypokalemia   Septic embolism (HCC)   Endocarditis   Prolonged Q-T interval on ECG   Acute endocarditis   Palliative care encounter   Respiratory failure (HCC)   Endocarditis of native valve   Staphylococcus aureus bacteremia with sepsis (HCC)   Acute renal failure (HCC)   Narcotic abuse   MRSA bacteremia   Dehydration    Melanie Crane is a 39 y.o. 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 Her transthoracic echocardiogram done here at [place] suggest that she may potentially have a mitral valve vegetation as well.  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 worsening septic emboli to the lungs:  - TEE is planned --repeat blood cultures from the third 2017 are no growth in 24 hours --continue vanco/rifampin/gent --she needs to have clearance of blood cultures before placement of PICC or central line   #2 Persistent ongoing IV drug abuse with now her fourth episode of endocarditis after her heart valve replacement in 2015: I have a high discrete degree of skepticism about her ability to comply with therapy but maybe there is a way going forward greatly appreciate psychiatry's and palliative cares assistance along with coronal care of the primary team  #3 Possible suicidal ideation: IVC continues greatly appreciate psychiatry's help  #4 chronic hepatitis C without hepatic coma: This could be considered   as an outpatient if this patient were ever stable to  follow-up in the outpatient world which I have great skepticism about   #5 Worsening chest pain hyypoxia: likely due to septic emboli possibly with progression. Could consider noncontrast CT to avoid contrast toxicity with vanco/gentamicin. I think the TEE is most important along with clearance of her blood are the two most important pieces of information we will be obtaining.    LOS: 5 days   Harace Mccluney [place] 09/16/2015, 4:59 PM   

## 2015-09-17 NOTE — Addendum Note (Signed)
Addendum  created 09/17/15 0956 by Edmonia CaprioAmanda M Sherl Yzaguirre, CRNA   Modules edited: Anesthesia Medication Administration

## 2015-09-17 NOTE — Interval H&P Note (Signed)
History and Physical Interval Note:  09/17/2015 8:03 AM  Melanie Crane  has presented today for surgery, with the diagnosis of endocarditis  The various methods of treatment have been discussed with the patient and family. After consideration of risks, benefits and other options for treatment, the patient has consented to  Procedure(s): TRANSESOPHAGEAL ECHOCARDIOGRAM (TEE) (N/A) as a surgical intervention .  The patient's history has been reviewed, patient examined, no change in status, stable for surgery.  I have reviewed the patient's chart and labs.  Questions were answered to the patient's satisfaction.     Dietrich PatesPaula Rebacca [place]

## 2015-09-17 NOTE — Progress Notes (Signed)
*  PRELIMINARY RESULTS* Echocardiogram Echocardiogram Transesophageal has been performed.  Jeryl Columbialliott, Vaishnavi Dalby 09/17/2015, 10:01 AM

## 2015-09-17 NOTE — Anesthesia Preprocedure Evaluation (Addendum)
Anesthesia Evaluation  Patient identified by MRN, date of birth, ID band Patient awake    Reviewed: Allergy & Precautions, NPO status , Patient's Chart, lab work & pertinent test results  History of Anesthesia Complications Negative for: history of anesthetic complications  Airway Mallampati: I       Dental  (+) Edentulous Upper, Edentulous Lower   Pulmonary Current Smoker,    breath sounds clear to auscultation       Cardiovascular + Valvular Problems/Murmurs  Rhythm:Regular Rate:Tachycardia     Neuro/Psych negative neurological ROS  negative psych ROS   GI/Hepatic (+) Hepatitis -, C  Endo/Other    Renal/GU ARF     Musculoskeletal   Abdominal   Peds  Hematology   Anesthesia Other Findings   Reproductive/Obstetrics                           Anesthesia Physical Anesthesia Plan  ASA: III  Anesthesia Plan: MAC   Post-op Pain Management:    Induction:   Airway Management Planned: Simple Face Mask  Additional Equipment:   Intra-op Plan:   Post-operative Plan:   Informed Consent: I have reviewed the patients History and Physical, chart, labs and discussed the procedure including the risks, benefits and alternatives for the proposed anesthesia with the patient or authorized representative who has indicated his/her understanding and acceptance.   Dental advisory given  Plan Discussed with: CRNA, Anesthesiologist and Surgeon  Anesthesia Plan Comments:         Anesthesia Quick Evaluation

## 2015-09-17 NOTE — Anesthesia Postprocedure Evaluation (Signed)
Anesthesia Post Note  Patient: Melanie Crane  Procedure(s) Performed: Procedure(s) (LRB): TRANSESOPHAGEAL ECHOCARDIOGRAM (TEE) (N/A)  Patient location during evaluation: PACU Anesthesia Type: MAC Level of consciousness: awake and alert Pain management: pain level controlled Vital Signs Assessment: post-procedure vital signs reviewed and stable Respiratory status: spontaneous breathing and respiratory function stable Cardiovascular status: stable Anesthetic complications: no    Last Vitals:  Filed Vitals:   09/17/15 0915 09/17/15 0925  BP: 85/52 89/49  Pulse: 92 95  Temp:    Resp: 42 42    Last Pain:  Filed Vitals:   09/17/15 0926  PainSc: 6                  Rasheida Broden DANIEL

## 2015-09-17 NOTE — Progress Notes (Signed)
Awaiting the okay for PICC placement.  Dr Jerral RalphGhimire said it will at least be one more day because the patient was running a low grade fever.  Also waiting for infectious disease

## 2015-09-17 NOTE — Progress Notes (Signed)
PATIENT DETAILS Name: Melanie Crane Age: 40 y.o. Sex: female Date of Birth: Oct 31, 1975 Admit Date: 09/10/2015 Admitting Physician Alyson Reedy, MD PCP:No PCP Per Patient  Subjective: Pain well controlled today. Sitting up in bed and smiling..  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 Repeat blood cultures on 4/3 negative so far. Underwent TEE on 4/7-which shows a large vegetation-ID recommended that we touch base with [facility] to see if patient is a candidate for surgery (although doubt at this time).Seen by psychiatry, patient deemed not to have capacity-is willing  continue IV antibiotics and SNF placement if needed. Place PICC line when okay with infectious disease.  Acute hypoxemic respiratory failure: Likely due to septic emboli.Recent CTA Chest 4/1 negative for Pul Embolism. Chest x-ray 4/6 negative for major abnormalities except for known septic emboli/nodular lesions. Continue to provide supportive care and taper off oxygen when possible. Not sure if anxiety is playing a role.  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  Chronic pain syndrome: Claims to have chronic chest pain- for which she abuses opana (intravenously)-claims takes around 40 mg TID-after discussion with psychiatry-we have increased MS Contin and have increased dosage for oxycodone for breakthrough pain. Currently pain seems adequately well-controlled per patient. Difficult situation, in order to ensure compliance  to medication and procedure she is going to need adequate pain control, once all procedures have been completed, we will need to slowly start tapering off narcotics.   History of IVDA (opiates-crushes opana): Counseled extensively.  Mild thrombocytopenia: Improving. Watch closely while on Lovenox.  Anemia: Likely secondary to acute illness/endocarditis-follow periodically.  Suicidal ideation: Reported that patient made multiple statements about "wanting to die", psych was consulted who recommended IVC. IVC placed. Dr. Elsie Saas, psychiatry, consulted with patient and determined IVC will remain in place as long as patient is inpatient-IVC can be rescinded when patient is discharged to SNF.   History of RUE DVT: PICC-line related during 09/2014 admission- had lupus anticoagulant positive-Dr. Fausto Skillern with hematology-Dr.Kale-and felt that patient did not require anticoagulation in this setting furthermore, patient is an active IVDA-noncompliant to antibiotics-and will be a very poor long-term anticoagulation candidate.  Chronic hepatitis C: follow up with ID clinic.   Disposition: Remain inpatient-SNF likely next week  Antimicrobial agents  See below  Anti-infectives    Start     Dose/Rate Route Frequency Ordered Stop   09/17/15 0500  vancomycin (VANCOCIN) 1,500 mg in sodium chloride 0.9 % 500 mL IVPB     1,500 mg 250 mL/hr over 120 Minutes Intravenous Every 12 hours 09/16/15 1833     09/14/15 0400  vancomycin (VANCOCIN) 1,250 mg in sodium chloride 0.9 % 250 mL IVPB  Status:  Discontinued     1,250 mg 166.7 mL/hr over 90 Minutes Intravenous Every 12 hours 09/13/15 1902 09/16/15 1833   09/13/15 1000  gentamicin (GARAMYCIN) 70 mg in dextrose 5 % 50 mL IVPB     70 mg 103.5 mL/hr over 30 Minutes Intravenous Every 8 hours 09/13/15 0938     09/12/15 1415  rifampin (RIFADIN)  300 mg in sodium chloride 0.9 % 100 mL IVPB     300 mg 200 mL/hr over 30 Minutes Intravenous 3 times per  day 09/12/15 1408     09/12/15 1100  ceFAZolin (ANCEF) IVPB 2g/100 mL premix  Status:  Discontinued     2 g 200 mL/hr over 30 Minutes Intravenous Every 8 hours 09/12/15 0957 09/13/15 0907   09/11/15 0900  vancomycin (VANCOCIN) IVPB 1000 mg/200 mL premix  Status:  Discontinued     1,000 mg 200 mL/hr over 60 Minutes Intravenous Every 8 hours 09/11/15 0644 09/13/15 1841   09/11/15 0645  piperacillin-tazobactam (ZOSYN) IVPB 3.375 g  Status:  Discontinued     3.375 g 12.5 mL/hr over 240 Minutes Intravenous 3 times per day 09/11/15 0644 09/12/15 0946   09/11/15 0100  ceFEPIme (MAXIPIME) 2 g in dextrose 5 % 50 mL IVPB     2 g 100 mL/hr over 30 Minutes Intravenous  Once 09/11/15 0056 09/11/15 0352   09/11/15 0045  vancomycin (VANCOCIN) IVPB 1000 mg/200 mL premix     1,000 mg 200 mL/hr over 60 Minutes Intravenous  Once 09/11/15 0040 09/11/15 0207      DVT Prophylaxis: Prophylactic Lovenox   Code Status: Full code   Family Communication None at bedsdie  Procedures: None  CONSULTS:  ID, psychiatry and Palliative care  Time spent 30 minutes-Greater than 50% of this time was spent in counseling, explanation of diagnosis, planning of further management, and coordination of care.  MEDICATIONS: Scheduled Meds: . clonazePAM  1 mg Oral BID  . [START ON 09/18/2015] cloNIDine  0.1 mg Oral Daily  . enoxaparin (LOVENOX) injection  40 mg Subcutaneous Q24H  . gentamicin  70 mg Intravenous Q8H  . magnesium oxide  400 mg Oral Daily  . metoprolol tartrate  25 mg Oral BID  . morphine  60 mg Oral Q12H  . pantoprazole  40 mg Oral Q1200  . polyethylene glycol  17 g Oral Daily  . rifampin (RIFADIN) IVPB  300 mg Intravenous 3 times per day  . senna  1 tablet Oral QHS  . sodium chloride flush  3 mL Intravenous Q12H  . vancomycin  1,500 mg Intravenous Q12H   Continuous Infusions:  PRN Meds:.acetaminophen **OR** [DISCONTINUED] acetaminophen, bisacodyl, diphenoxylate-atropine, gi cocktail,  methocarbamol, naLOXone (NARCAN)  injection, oxyCODONE, sodium phosphate    PHYSICAL EXAM: Vital signs in last 24 hours: Filed Vitals:   09/17/15 0902 09/17/15 0905 09/17/15 0915 09/17/15 0925  BP: 86/48 86/51 85/52  89/49  Pulse: 96 94 92 95  Temp:      TempSrc:      Resp: 34 37 42 42  Height:      Weight:      SpO2: 92% 92% 93% 94%    Weight change:  Filed Weights   09/14/15 0355 09/14/15 0800 09/15/15 0559  Weight: 71 kg (156 lb 8.4 oz) 70.9 kg (156 lb 4.9 oz) 70.2 kg (154 lb 12.2 oz)   Body mass index is 26.55 kg/(m^2).   Gen Exam: Awake and alert with clear speech.   Neck: Supple, No JVD.   Chest: B/L Clear.   CVS: S1 S2 Regular, no murmurs.  Abdomen: soft, BS +, non tender, non distended.  Extremities: no edema, lower extremities warm to touch. Neurologic: Non Focal.  Skin: No Rash.   Wounds: N/A.    Intake/Output from previous day:  Intake/Output Summary (Last 24 hours) at 09/17/15 1312 Last data filed at 09/16/15 1829  Gross per 24  hour  Intake  353.5 ml  Output    500 ml  Net -146.5 ml     LAB RESULTS: CBC  Recent Labs Lab 09/11/15 1715  09/13/15 0232 09/14/15 1009 09/15/15 0530 09/16/15 0529 09/17/15 0548  WBC 10.1  < > 8.6 7.7 8.8 10.4 16.1*  HGB 10.4*  < > 9.7* 10.5* 9.8* 10.4* 11.4*  HCT 32.0*  < > 31.2* 33.9* 30.5* 33.3* 36.0  PLT 53*  < > 77* 111* 141* 135* 165  MCV 73.4*  < > 74.1* 74.7* 75.7* 76.7* 77.3*  MCH 23.9*  < > 23.0* 23.1* 24.3* 24.0* 24.5*  MCHC 32.5  < > 31.1 31.0 32.1 31.2 31.7  RDW 17.8*  < > 17.7* 17.4* 17.8* 19.0* 19.2*  LYMPHSABS 1.1  --   --   --  1.9  --   --   MONOABS 1.3*  --   --   --  0.8  --   --   EOSABS 0.0  --   --   --  0.0  --   --   BASOSABS 0.0  --   --   --  0.0  --   --   < > = values in this interval not displayed.  Chemistries   Recent Labs Lab 09/11/15 0626  09/12/15 0130 09/13/15 0232 09/14/15 1009 09/15/15 0530 09/16/15 0529  NA  --   < > 139 140 137 138 137  K  --   < > 3.2* 3.7 3.5  5.2* 4.8  CL  --   < > 110 109 105 106 102  CO2  --   < > 18* 22 19* 20* 23  GLUCOSE  --   < > 157* 131* 216* 91 92  BUN  --   < > 18 16 12 7 7   CREATININE  --   < > 0.90 0.90 0.90 0.71 0.82  CALCIUM 7.4*  < > 7.6* 7.8* 7.9* 8.0* 8.1*  MG 1.5*  --  1.8 1.6*  --   --   --   < > = values in this interval not displayed.  CBG:  Recent Labs Lab 09/12/15 0405 09/12/15 1626 09/12/15 1927 09/12/15 2353 09/13/15 0408  GLUCAP 143* 108* 119* 191* 114*    GFR Estimated Creatinine Clearance: 88.6 mL/min (by C-G formula based on Cr of 0.82).  Coagulation profile  Recent Labs Lab 09/10/15 2220 09/12/15 0130 09/13/15 0232 09/15/15 0530  INR 1.26 2.26* 1.32 1.35    Cardiac Enzymes  Recent Labs Lab 09/11/15 0626 09/11/15 1129 09/11/15 1715  TROPONINI 0.03 0.03 0.14*    Invalid input(s): POCBNP No results for input(s): DDIMER in the last 72 hours. No results for input(s): HGBA1C in the last 72 hours. No results for input(s): CHOL, HDL, LDLCALC, TRIG, CHOLHDL, LDLDIRECT in the last 72 hours. No results for input(s): TSH, T4TOTAL, T3FREE, THYROIDAB in the last 72 hours.  Invalid input(s): FREET3 No results for input(s): VITAMINB12, FOLATE, FERRITIN, TIBC, IRON, RETICCTPCT in the last 72 hours. No results for input(s): LIPASE, AMYLASE in the last 72 hours.  Urine Studies No results for input(s): UHGB, CRYS in the last 72 hours.  Invalid input(s): UACOL, UAPR, USPG, UPH, UTP, UGL, UKET, UBIL, UNIT, UROB, ULEU, UEPI, UWBC, URBC, UBAC, CAST, UCOM, BILUA  MICROBIOLOGY: Recent Results (from the past 240 hour(s))  Blood culture (routine x 2)     Status: None   Collection Time: 09/10/15 11:50 PM  Result Value Ref Range Status  Specimen Description BLOOD RIGHT ARM  Final   Special Requests BOTTLES DRAWN AEROBIC AND ANAEROBIC  Final   Culture NO GROWTH 5 DAYS  Final   Report Status 09/16/2015 FINAL  Final  Blood culture (routine x 2)     Status: None   Collection Time:  09/10/15 11:55 PM  Result Value Ref Range Status   Specimen Description BLOOD RIGHT HAND  Final   Special Requests BOTTLES DRAWN AEROBIC AND ANAEROBIC  Final   Culture NO GROWTH 5 DAYS  Final   Report Status 09/16/2015 FINAL  Final  Culture, blood (single) w Reflex to ID Panel     Status: None   Collection Time: 09/11/15 12:40 AM  Result Value Ref Range Status   Specimen Description BLOOD RIGHT ARM  Final   Special Requests IN PEDIATRIC BOTTLE  Final   Culture  Setup Time   Final    GRAM POSITIVE COCCI IN CLUSTERS PEDIATRIC BOTTLE CALLED TO FLYNT,F RN 09/11/15 2046 WOOTEN,K CONFIRMED BY V WILKINS    Culture METHICILLIN RESISTANT STAPHYLOCOCCUS AUREUS  Final   Report Status 09/13/2015 FINAL  Final   Organism ID, Bacteria METHICILLIN RESISTANT STAPHYLOCOCCUS AUREUS  Final      Susceptibility   Methicillin resistant staphylococcus aureus - MIC*    CIPROFLOXACIN >=8 RESISTANT Resistant     ERYTHROMYCIN >=8 RESISTANT Resistant     GENTAMICIN <=0.5 SENSITIVE Sensitive     OXACILLIN >=4 RESISTANT Resistant     TETRACYCLINE <=1 SENSITIVE Sensitive     VANCOMYCIN 1 SENSITIVE Sensitive     TRIMETH/SULFA <=10 SENSITIVE Sensitive     CLINDAMYCIN <=0.25 SENSITIVE Sensitive     RIFAMPIN <=0.5 SENSITIVE Sensitive     Inducible Clindamycin NEGATIVE Sensitive     * METHICILLIN RESISTANT STAPHYLOCOCCUS AUREUS  MRSA PCR Screening     Status: Abnormal   Collection Time: 09/11/15  6:54 PM  Result Value Ref Range Status   MRSA by PCR POSITIVE (A) NEGATIVE Final    Comment:        The GeneXpert MRSA Assay (FDA approved for NASAL specimens only), is one component of a comprehensive MRSA colonization surveillance program. It is not intended to diagnose MRSA infection nor to guide or monitor treatment for MRSA infections. RESULT CALLED TO, READ BACK BY AND VERIFIED WITH: T.CRITE,RN 09/11/15 @2042  BY V.WILKINS   Culture, blood (Routine X 2) w Reflex to ID Panel     Status: None  (Preliminary result)   Collection Time: 09/13/15  4:05 PM  Result Value Ref Range Status   Specimen Description BLOOD LEFT ANTECUBITAL  Final   Special Requests IN PEDIATRIC BOTTLE 3CC  Final   Culture NO GROWTH 3 DAYS  Final   Report Status PENDING  Incomplete  Culture, blood (Routine X 2) w Reflex to ID Panel     Status: None (Preliminary result)   Collection Time: 09/13/15  4:15 PM  Result Value Ref Range Status   Specimen Description BLOOD LEFT ANTECUBITAL  Final   Special Requests IN PEDIATRIC BOTTLE 0.5CC  Final   Culture NO GROWTH 3 DAYS  Final   Report Status PENDING  Incomplete    RADIOLOGY STUDIES/RESULTS: Dg Chest 2 View  09/12/2015  CLINICAL DATA:  Respiratory failure. EXAM: CHEST  2 VIEW COMPARISON:  September 11, 2015. FINDINGS: Stable cardiomegaly. No pneumothorax is noted. Mild bibasilar opacities are noted concerning for subsegmental atelectasis or possibly inflammation. Bony thorax is unremarkable. IMPRESSION: Mild bibasilar opacities concerning for subsegmental atelectasis or  possibly inflammation. Electronically Signed   By: Lupita Raider, M.D.   On: 09/12/2015 09:11   Dg Chest 2 View  09/10/2015  CLINICAL DATA:  Acute onset of generalized chest pain and shortness of breath. Drowsiness. Initial encounter. EXAM: CHEST  2 VIEW COMPARISON:  CTA of the chest performed 04/10/2015 FINDINGS: Right midlung density, measuring 3.1 cm, demonstrates increased density compared to prior studies. This may reflect residual or recurrent cavitating pneumonia or abscess, though a mass cannot be excluded. No pleural effusion or pneumothorax is seen. The heart is normal in size. No acute osseous abnormalities are identified. IMPRESSION: Right midlung density, measuring 3.1 cm, demonstrates increased density compared to prior studies. This may reflect residual or recurrent cavitating pneumonia or abscess, though a mass cannot be excluded. CT of the chest could be considered for further evaluation,  when and as deemed clinically appropriate. Electronically Signed   By: Roanna Raider M.D.   On: 09/10/2015 23:23   Ct Angio Chest Pe W/cm &/or Wo Cm  09/11/2015  CLINICAL DATA:  Chronic nonradiating chest pain, fever, shortness of breath, cough and mild abdominal pain. Initial encounter. EXAM: CT ANGIOGRAPHY CHEST WITH CONTRAST TECHNIQUE: Multidetector CT imaging of the chest was performed using the standard protocol during bolus administration of intravenous contrast. Multiplanar CT image reconstructions and MIPs were obtained to evaluate the vascular anatomy. CONTRAST:  50mL OMNIPAQUE IOHEXOL 350 MG/ML SOLN COMPARISON:  Chest radiograph performed 09/10/2015, and CTA of the chest performed 04/10/2015 FINDINGS: There is no evidence of significant pulmonary embolus. Multiple nodular opacities are noted scattered throughout the lungs, many of which demonstrate central cavitation, most compatible with diffuse bilateral septic emboli. Underlying trace right-sided pleural fluid is noted. No pneumothorax is seen. Scattered coronary artery calcifications are seen. Prominent subcarinal and right hilar nodes are seen, measuring 1.6 cm and 1.2 cm in short axis. No pericardial effusion is identified. The great vessels are grossly unremarkable in appearance. Postoperative change is noted about the right atrium and right ventricle. No axillary lymphadenopathy is seen. The visualized portions of the thyroid gland are unremarkable in appearance. The visualized portions of the liver are unremarkable. The spleen is somewhat bulky, but only borderline prominent in length. Trace ascites is suggested noted tracking about the liver and spleen. No acute osseous abnormalities are seen. Review of the MIP images confirms the above findings. IMPRESSION: 1. No evidence of significant pulmonary embolus. 2. Multiple nodular opacities scattered throughout the lungs, many of which demonstrate central cavitation, most compatible with diffuse  bilateral septic emboli, given the patient's history. 3. Underlying trace right-sided pleural fluid noted. 4. Prominent mediastinal and right hilar nodes noted, likely reflecting the underlying infection. 5. Scattered coronary artery calcifications seen. 6. Suggestion of trace ascites at the upper abdomen. These results were called by telephone at the time of interpretation on 09/11/2015 at 2:49 am to Dr. Zadie Rhine, who verbally acknowledged these results. Electronically Signed   By: Roanna Raider M.D.   On: 09/11/2015 02:49   Dg Chest Port 1 View  09/16/2015  CLINICAL DATA:  Shortness of breath. EXAM: PORTABLE CHEST 1 VIEW COMPARISON:  09/12/2015.  CT 09/11/2015. FINDINGS: Heart size normal. Persistent nodular densities/infiltrates are again noted throughout both lungs. Cavitary nodular densities are noted on prior recent CT of 09/11/2015. No pleural effusion pneumothorax. Stable cardiomegaly. No acute bony abnormality. IMPRESSION: Persistent nodular opacities noted throughout both lungs. Similar findings noted on prior exams. Cavitary nodular infiltrates were noted on prior CT of 09/11/2015. Electronically Signed  ByMaisie Fus  Register   On: 09/16/2015 08:12   Dg Chest Portable 1 View  09/11/2015  CLINICAL DATA:  Sepsis, endocarditis, fever EXAM: PORTABLE CHEST 1 VIEW COMPARISON:  Portable exam 1548 hours compared to 09/10/2015 and correlated with chest CT of 09/11/2015 FINDINGS: Normal size of cardiac silhouette post TVR. Mediastinal contours and pulmonary vascularity normal. Lungs clear. No pleural effusion or pneumothorax. Bones unremarkable. IMPRESSION: No acute abnormalities. Electronically Signed   By: Ulyses Southward M.D.   On: 09/11/2015 15:58    Jeoffrey Massed, MD  [facility] Pager:[phone]  If 7PM-7AM, please contact night-coverage [email or web address] Password [email or web address] 09/17/2015, 1:12 PM   LOS: 6 days

## 2015-09-17 NOTE — Progress Notes (Signed)
Palliative Medicine will sign off- recommend using long acting opiates for chronic pain and minimize PRN dosing and associated addiction behaviors associated with deliver of IR opiates. She is receiving 45mg  of BID MSContin with an additional 70 mg of IR oxycodone. Consider increasing MSContin to 100 BID and increasing PRN interval of breakthrough oxycontin. No IV meds should be administered unless indication for acute pain and inability to take oral meds. Provide reassurance that pain will be managed and medications controlled. Please re-consult if there are additional palliative needs. May also use Clonidine to control addiction symptoms.  Anderson MaltaElizabeth Perla Echavarria, DO Palliative Medicine [phone]

## 2015-09-17 NOTE — Progress Notes (Addendum)
CSW following for SNF placement for pt for long term IV antibiotics.  CSW discussed case with medical director who states pt must be cleared by psych before we can pursue placement- no facility will take pt while we still deem it necessary for her to be on IVC and have suicide sitter  CSW informed psych CSW who has reached out to MD to request pt be reevaluated- psych CSW provided paperwork to revoke IVC in case pt is cleared- placed on chart  CSW submitted for [facility] for SNF admission- under review due to pt suicidal ideation and drug use- will need [facility] number approved prior to placement  CSW will continue to follow  Merlyn LotJenna Holoman, [facility] Clinical Social Worker [phone]

## 2015-09-18 LAB — CULTURE, BLOOD (ROUTINE X 2)
Culture: NO GROWTH
Culture: NO GROWTH

## 2015-09-18 LAB — CBC
HEMATOCRIT: 31.5 % — AB (ref 36.0–46.0)
Hemoglobin: 9.6 g/dL — ABNORMAL LOW (ref 12.0–15.0)
MCH: 23.5 pg — ABNORMAL LOW (ref 26.0–34.0)
MCHC: 30.5 g/dL (ref 30.0–36.0)
MCV: 77.2 fL — ABNORMAL LOW (ref 78.0–100.0)
Platelets: 128 10*3/uL — ABNORMAL LOW (ref 150–400)
RBC: 4.08 MIL/uL (ref 3.87–5.11)
RDW: 18.6 % — AB (ref 11.5–15.5)
WBC: 12.1 10*3/uL — ABNORMAL HIGH (ref 4.0–10.5)

## 2015-09-18 MED ORDER — MORPHINE SULFATE ER 30 MG PO TBCR
75.0000 mg | EXTENDED_RELEASE_TABLET | Freq: Two times a day (BID) | ORAL | Status: DC
  Administered 2015-09-18 – 2015-09-22 (×8): 75 mg via ORAL
  Filled 2015-09-18 (×8): qty 2

## 2015-09-18 NOTE — Progress Notes (Signed)
PATIENT DETAILS Name: Melanie Crane Age: 40 y.o. 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  Subjective: Continues to have upper chest pain, abdominal pain-all of these are chronic-claimed pain is not as well-controlled as yesterday. .  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 Repeat blood cultures on 4/3 negative so far. Underwent TEE on 4/7-which shows a large vegetation-ID recommended that we touch base with [facility] to see if patient is a candidate for surgery (although doubt at this time).Seen by psychiatry, patient deemed not to have capacity-is willing  continue IV antibiotics and SNF placement if needed. Place PICC line when okay with infectious disease.  Acute hypoxemic respiratory failure: Likely due to septic emboli.Recent CTA Chest 4/1 negative for Pul Embolism. Chest x-ray 4/6 negative for major abnormalities except for known septic emboli/nodular lesions. Continue to provide supportive care and taper off oxygen when possible. Not sure if anxiety is playing a role.  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  Chronic pain syndrome: Claims to have chronic chest pain- for which she abuses opana (intravenously)-claims takes around 40 mg TID-after discussion with psychiatry-we have increased MS Contin and have increased dosage for oxycodone for breakthrough pain. Currently pain seems relatively  well-controlled-but appears to have flared up again today-we will increase MS Contin to 75 mg twice a day.  Difficult situation, in order to ensure compliance to medication and procedure she is going to need adequate pain control, once all procedures have been completed, we will need to slowly start tapering off narcotics.   History of IVDA (opiates-crushes opana): Counseled extensively.  Mild thrombocytopenia: Improving. Watch closely while on Lovenox.  Anemia: Likely secondary to acute illness/endocarditis-follow periodically.  Suicidal ideation: Reported that patient made multiple statements about "wanting to die", psych was consulted who recommended IVC. IVC placed. Dr. Elsie SaasJonnalagadda, psychiatry, consulted with patient and determined IVC will remain in place as long as patient is inpatient-IVC can be rescinded when patient is discharged to SNF.   History of RUE DVT: PICC-line related during 09/2014 admission- had lupus anticoagulant positive-Dr. Fausto SkillernSamtani-discussed with hematology-Dr.Kale-and felt that patient did not require anticoagulation in this setting furthermore, patient is an active IVDA-noncompliant to antibiotics-and will be a very poor long-term anticoagulation candidate.  Chronic hepatitis C: follow up with ID clinic.   Disposition: Remain inpatient-SNF likely next week  Antimicrobial agents  See below  Anti-infectives    Start     Dose/Rate Route Frequency Ordered Stop   09/17/15 0500  vancomycin (VANCOCIN) 1,500 mg in sodium chloride 0.9 % 500 mL IVPB     1,500 mg 250 mL/hr over 120 Minutes Intravenous Every 12 hours 09/16/15 1833     09/14/15 0400  vancomycin (VANCOCIN) 1,250 mg in sodium chloride 0.9 % 250 mL IVPB  Status:  Discontinued     1,250 mg 166.7 mL/hr over 90 Minutes Intravenous Every 12 hours 09/13/15 1902 09/16/15 1833   09/13/15 1000  gentamicin (GARAMYCIN) 70 mg in dextrose 5 % 50 mL  IVPB     70 mg 103.5 mL/hr over 30 Minutes Intravenous Every 8 hours  09/13/15 0938     09/12/15 1415  rifampin (RIFADIN) 300 mg in sodium chloride 0.9 % 100 mL IVPB     300 mg 200 mL/hr over 30 Minutes Intravenous 3 times per day 09/12/15 1408     09/12/15 1100  ceFAZolin (ANCEF) IVPB 2g/100 mL premix  Status:  Discontinued     2 g 200 mL/hr over 30 Minutes Intravenous Every 8 hours 09/12/15 0957 09/13/15 0907   09/11/15 0900  vancomycin (VANCOCIN) IVPB 1000 mg/200 mL premix  Status:  Discontinued     1,000 mg 200 mL/hr over 60 Minutes Intravenous Every 8 hours 09/11/15 0644 09/13/15 1841   09/11/15 0645  piperacillin-tazobactam (ZOSYN) IVPB 3.375 g  Status:  Discontinued     3.375 g 12.5 mL/hr over 240 Minutes Intravenous 3 times per day 09/11/15 0644 09/12/15 0946   09/11/15 0100  ceFEPIme (MAXIPIME) 2 g in dextrose 5 % 50 mL IVPB     2 g 100 mL/hr over 30 Minutes Intravenous  Once 09/11/15 0056 09/11/15 0352   09/11/15 0045  vancomycin (VANCOCIN) IVPB 1000 mg/200 mL premix     1,000 mg 200 mL/hr over 60 Minutes Intravenous  Once 09/11/15 0040 09/11/15 0207      DVT Prophylaxis: Prophylactic Lovenox   Code Status: Full code   Family Communication None at bedsdie  Procedures: None  CONSULTS:  ID, psychiatry and Palliative care  Time spent 30 minutes-Greater than 50% of this time was spent in counseling, explanation of diagnosis, planning of further management, and coordination of care.  MEDICATIONS: Scheduled Meds: . clonazePAM  1 mg Oral BID  . cloNIDine  0.1 mg Oral Daily  . enoxaparin (LOVENOX) injection  40 mg Subcutaneous Q24H  . gentamicin  70 mg Intravenous Q8H  . magnesium oxide  400 mg Oral Daily  . metoprolol tartrate  25 mg Oral BID  . morphine  75 mg Oral Q12H  . pantoprazole  40 mg Oral Q1200  . polyethylene glycol  17 g Oral Daily  . rifampin (RIFADIN) IVPB  300 mg Intravenous 3 times per day  . senna  1 tablet Oral QHS  . sodium chloride flush  3 mL Intravenous Q12H  . vancomycin  1,500 mg Intravenous Q12H    Continuous Infusions:  PRN Meds:.acetaminophen **OR** [DISCONTINUED] acetaminophen, bisacodyl, diphenoxylate-atropine, gi cocktail, methocarbamol, naLOXone (NARCAN)  injection, oxyCODONE, sodium phosphate    PHYSICAL EXAM: Vital signs in last 24 hours: Filed Vitals:   09/18/15 0516 09/18/15 0537 09/18/15 0947 09/18/15 1304  BP: 102/61  104/63 99/60  Pulse: 98  58 102  Temp: 98.3 F (36.8 C)  98.5 F (36.9 C) 100.4 F (38 C)  TempSrc: Oral  Oral Oral  Resp: 16     Height:      Weight:  68.312 kg (150 lb 9.6 oz)    SpO2: 95%  91% 96%    Weight change:  Filed Weights   09/14/15 0800 09/15/15 0559 09/18/15 0537  Weight: 70.9 kg (156 lb 4.9 oz) 70.2 kg (154 lb 12.2 oz) 68.312 kg (150 lb 9.6 oz)   Body mass index is 25.84 kg/(m^2).   Gen Exam: Awake and alert with clear speech.  Sleeping comfortably in bed without any distress Neck: Supple, No JVD.   Chest: B/L Clear.   CVS: S1 S2 Regular, no murmurs.  Abdomen: soft, BS +, non tender, non distended.  Extremities:  no edema, lower extremities warm to touch. Neurologic: Non Focal.  Skin: No Rash.   Wounds: N/A.    Intake/Output from previous day:  Intake/Output Summary (Last 24 hours) at 09/18/15 1400 Last data filed at 09/18/15 1009  Gross per 24 hour  Intake 1619.5 ml  Output      0 ml  Net 1619.5 ml     LAB RESULTS: CBC  Recent Labs Lab 09/11/15 1715  09/14/15 1009 09/15/15 0530 09/16/15 0529 09/17/15 0548 09/18/15 0737  WBC 10.1  < > 7.7 8.8 10.4 16.1* 12.1*  HGB 10.4*  < > 10.5* 9.8* 10.4* 11.4* 9.6*  HCT 32.0*  < > 33.9* 30.5* 33.3* 36.0 31.5*  PLT 53*  < > 111* 141* 135* 165 128*  MCV 73.4*  < > 74.7* 75.7* 76.7* 77.3* 77.2*  MCH 23.9*  < > 23.1* 24.3* 24.0* 24.5* 23.5*  MCHC 32.5  < > 31.0 32.1 31.2 31.7 30.5  RDW 17.8*  < > 17.4* 17.8* 19.0* 19.2* 18.6*  LYMPHSABS 1.1  --   --  1.9  --   --   --   MONOABS 1.3*  --   --  0.8  --   --   --   EOSABS 0.0  --   --  0.0  --   --   --   BASOSABS  0.0  --   --  0.0  --   --   --   < > = values in this interval not displayed.  Chemistries   Recent Labs Lab 09/12/15 0130 09/13/15 0232 09/14/15 1009 09/15/15 0530 09/16/15 0529  NA 139 140 137 138 137  K 3.2* 3.7 3.5 5.2* 4.8  CL 110 109 105 106 102  CO2 18* 22 19* 20* 23  GLUCOSE 157* 131* 216* 91 92  BUN 18 16 12 7 7   CREATININE 0.90 0.90 0.90 0.71 0.82  CALCIUM 7.6* 7.8* 7.9* 8.0* 8.1*  MG 1.8 1.6*  --   --   --     CBG:  Recent Labs Lab 09/12/15 0405 09/12/15 1626 09/12/15 1927 09/12/15 2353 09/13/15 0408  GLUCAP 143* 108* 119* 191* 114*    GFR Estimated Creatinine Clearance: 87.4 mL/min (by C-G formula based on Cr of 0.82).  Coagulation profile  Recent Labs Lab 09/12/15 0130 09/13/15 0232 09/15/15 0530  INR 2.26* 1.32 1.35    Cardiac Enzymes  Recent Labs Lab 09/11/15 1715  TROPONINI 0.14*    Invalid input(s): POCBNP No results for input(s): DDIMER in the last 72 hours. No results for input(s): HGBA1C in the last 72 hours. No results for input(s): CHOL, HDL, LDLCALC, TRIG, CHOLHDL, LDLDIRECT in the last 72 hours. No results for input(s): TSH, T4TOTAL, T3FREE, THYROIDAB in the last 72 hours.  Invalid input(s): FREET3 No results for input(s): VITAMINB12, FOLATE, FERRITIN, TIBC, IRON, RETICCTPCT in the last 72 hours. No results for input(s): LIPASE, AMYLASE in the last 72 hours.  Urine Studies No results for input(s): UHGB, CRYS in the last 72 hours.  Invalid input(s): UACOL, UAPR, USPG, UPH, UTP, UGL, UKET, UBIL, UNIT, UROB, ULEU, UEPI, UWBC, URBC, UBAC, CAST, UCOM, BILUA  MICROBIOLOGY: Recent Results (from the past 240 hour(s))  Blood culture (routine x 2)     Status: None   Collection Time: 09/10/15 11:50 PM  Result Value Ref Range Status   Specimen Description BLOOD RIGHT ARM  Final   Special Requests BOTTLES DRAWN AEROBIC AND ANAEROBIC  Final   Culture NO GROWTH  5 DAYS  Final   Report Status 09/16/2015 FINAL  Final  Blood  culture (routine x 2)     Status: None   Collection Time: 09/10/15 11:55 PM  Result Value Ref Range Status   Specimen Description BLOOD RIGHT HAND  Final   Special Requests BOTTLES DRAWN AEROBIC AND ANAEROBIC  Final   Culture NO GROWTH 5 DAYS  Final   Report Status 09/16/2015 FINAL  Final  Culture, blood (single) w Reflex to ID Panel     Status: None   Collection Time: 09/11/15 12:40 AM  Result Value Ref Range Status   Specimen Description BLOOD RIGHT ARM  Final   Special Requests IN PEDIATRIC BOTTLE  Final   Culture  Setup Time   Final    GRAM POSITIVE COCCI IN CLUSTERS PEDIATRIC BOTTLE CALLED TO FLYNT,F RN 09/11/15 2046 WOOTEN,K CONFIRMED BY V WILKINS    Culture METHICILLIN RESISTANT STAPHYLOCOCCUS AUREUS  Final   Report Status 09/13/2015 FINAL  Final   Organism ID, Bacteria METHICILLIN RESISTANT STAPHYLOCOCCUS AUREUS  Final      Susceptibility   Methicillin resistant staphylococcus aureus - MIC*    CIPROFLOXACIN >=8 RESISTANT Resistant     ERYTHROMYCIN >=8 RESISTANT Resistant     GENTAMICIN <=0.5 SENSITIVE Sensitive     OXACILLIN >=4 RESISTANT Resistant     TETRACYCLINE <=1 SENSITIVE Sensitive     VANCOMYCIN 1 SENSITIVE Sensitive     TRIMETH/SULFA <=10 SENSITIVE Sensitive     CLINDAMYCIN <=0.25 SENSITIVE Sensitive     RIFAMPIN <=0.5 SENSITIVE Sensitive     Inducible Clindamycin NEGATIVE Sensitive     * METHICILLIN RESISTANT STAPHYLOCOCCUS AUREUS  MRSA PCR Screening     Status: Abnormal   Collection Time: 09/11/15  6:54 PM  Result Value Ref Range Status   MRSA by PCR POSITIVE (A) NEGATIVE Final    Comment:        The GeneXpert MRSA Assay (FDA approved for NASAL specimens only), is one component of a comprehensive MRSA colonization surveillance program. It is not intended to diagnose MRSA infection nor to guide or monitor treatment for MRSA infections. RESULT CALLED TO, READ BACK BY AND VERIFIED WITH: T.CRITE,RN 09/11/15  BY V.WILKINS   Culture, blood  (Routine X 2) w Reflex to ID Panel     Status: None   Collection Time: 09/13/15  4:05 PM  Result Value Ref Range Status   Specimen Description BLOOD LEFT ANTECUBITAL  Final   Special Requests IN PEDIATRIC BOTTLE 3CC  Final   Culture NO GROWTH 5 DAYS  Final   Report Status 09/18/2015 FINAL  Final  Culture, blood (Routine X 2) w Reflex to ID Panel     Status: None   Collection Time: 09/13/15  4:15 PM  Result Value Ref Range Status   Specimen Description BLOOD LEFT ANTECUBITAL  Final   Special Requests IN PEDIATRIC BOTTLE 0.5CC  Final   Culture NO GROWTH 5 DAYS  Final   Report Status 09/18/2015 FINAL  Final    RADIOLOGY STUDIES/RESULTS: Dg Chest 2 View  09/12/2015  CLINICAL DATA:  Respiratory failure. EXAM: CHEST  2 VIEW COMPARISON:  September 11, 2015. FINDINGS: Stable cardiomegaly. No pneumothorax is noted. Mild bibasilar opacities are noted concerning for subsegmental atelectasis or possibly inflammation. Bony thorax is unremarkable. IMPRESSION: Mild bibasilar opacities concerning for subsegmental atelectasis or possibly inflammation. Electronically Signed   By: Lupita Raider, M.D.   On: 09/12/2015 09:11   Dg Chest 2 View  09/10/2015  CLINICAL DATA:  Acute onset of generalized chest pain and shortness of breath. Drowsiness. Initial encounter. EXAM: CHEST  2 VIEW COMPARISON:  CTA of the chest performed 04/10/2015 FINDINGS: Right midlung density, measuring 3.1 cm, demonstrates increased density compared to prior studies. This may reflect residual or recurrent cavitating pneumonia or abscess, though a mass cannot be excluded. No pleural effusion or pneumothorax is seen. The heart is normal in size. No acute osseous abnormalities are identified. IMPRESSION: Right midlung density, measuring 3.1 cm, demonstrates increased density compared to prior studies. This may reflect residual or recurrent cavitating pneumonia or abscess, though a mass cannot be excluded. CT of the chest could be considered for  further evaluation, when and as deemed clinically appropriate. Electronically Signed   By: Roanna Raider M.D.   On: 09/10/2015 23:23   Ct Angio Chest Pe W/cm &/or Wo Cm  09/11/2015  CLINICAL DATA:  Chronic nonradiating chest pain, fever, shortness of breath, cough and mild abdominal pain. Initial encounter. EXAM: CT ANGIOGRAPHY CHEST WITH CONTRAST TECHNIQUE: Multidetector CT imaging of the chest was performed using the standard protocol during bolus administration of intravenous contrast. Multiplanar CT image reconstructions and MIPs were obtained to evaluate the vascular anatomy. CONTRAST:  50mL OMNIPAQUE IOHEXOL 350 MG/ML SOLN COMPARISON:  Chest radiograph performed 09/10/2015, and CTA of the chest performed 04/10/2015 FINDINGS: There is no evidence of significant pulmonary embolus. Multiple nodular opacities are noted scattered throughout the lungs, many of which demonstrate central cavitation, most compatible with diffuse bilateral septic emboli. Underlying trace right-sided pleural fluid is noted. No pneumothorax is seen. Scattered coronary artery calcifications are seen. Prominent subcarinal and right hilar nodes are seen, measuring 1.6 cm and 1.2 cm in short axis. No pericardial effusion is identified. The great vessels are grossly unremarkable in appearance. Postoperative change is noted about the right atrium and right ventricle. No axillary lymphadenopathy is seen. The visualized portions of the thyroid gland are unremarkable in appearance. The visualized portions of the liver are unremarkable. The spleen is somewhat bulky, but only borderline prominent in length. Trace ascites is suggested noted tracking about the liver and spleen. No acute osseous abnormalities are seen. Review of the MIP images confirms the above findings. IMPRESSION: 1. No evidence of significant pulmonary embolus. 2. Multiple nodular opacities scattered throughout the lungs, many of which demonstrate central cavitation, most  compatible with diffuse bilateral septic emboli, given the patient's history. 3. Underlying trace right-sided pleural fluid noted. 4. Prominent mediastinal and right hilar nodes noted, likely reflecting the underlying infection. 5. Scattered coronary artery calcifications seen. 6. Suggestion of trace ascites at the upper abdomen. These results were called by telephone at the time of interpretation on 09/11/2015 at 2:49 am to Dr. Zadie Rhine, who verbally acknowledged these results. Electronically Signed   By: Roanna Raider M.D.   On: 09/11/2015 02:49   Dg Chest Port 1 View  09/16/2015  CLINICAL DATA:  Shortness of breath. EXAM: PORTABLE CHEST 1 VIEW COMPARISON:  09/12/2015.  CT 09/11/2015. FINDINGS: Heart size normal. Persistent nodular densities/infiltrates are again noted throughout both lungs. Cavitary nodular densities are noted on prior recent CT of 09/11/2015. No pleural effusion pneumothorax. Stable cardiomegaly. No acute bony abnormality. IMPRESSION: Persistent nodular opacities noted throughout both lungs. Similar findings noted on prior exams. Cavitary nodular infiltrates were noted on prior CT of 09/11/2015. Electronically Signed   By: Maisie Fus  Register   On: 09/16/2015 08:12   Dg Chest Portable 1 View  09/11/2015  CLINICAL DATA:  Sepsis, endocarditis,  fever EXAM: PORTABLE CHEST 1 VIEW COMPARISON:  Portable exam 1548 hours compared to 09/10/2015 and correlated with chest CT of 09/11/2015 FINDINGS: Normal size of cardiac silhouette post TVR. Mediastinal contours and pulmonary vascularity normal. Lungs clear. No pleural effusion or pneumothorax. Bones unremarkable. IMPRESSION: No acute abnormalities. Electronically Signed   By: Ulyses Southward M.D.   On: 09/11/2015 15:58    Jeoffrey Massed, MD  [facility] Pager:[phone]  If 7PM-7AM, please contact night-coverage [email or web address] Password [email or web address] 09/18/2015, 2:00 PM   LOS: 7 days

## 2015-09-19 DIAGNOSIS — I339 Acute and subacute endocarditis, unspecified: Secondary | ICD-10-CM

## 2015-09-19 LAB — BASIC METABOLIC PANEL
Anion gap: 14 (ref 5–15)
BUN: 11 mg/dL (ref 6–20)
CHLORIDE: 104 mmol/L (ref 101–111)
CO2: 17 mmol/L — AB (ref 22–32)
Calcium: 7.9 mg/dL — ABNORMAL LOW (ref 8.9–10.3)
Creatinine, Ser: 0.93 mg/dL (ref 0.44–1.00)
GFR calc non Af Amer: >60 mL/min (ref >60)
Glucose, Bld: 135 mg/dL — ABNORMAL HIGH (ref 65–99)
POTASSIUM: 4.5 mmol/L (ref 3.5–5.1)
SODIUM: 135 mmol/L (ref 135–145)

## 2015-09-19 MED ORDER — RIFAMPIN 300 MG PO CAPS
300.0000 mg | ORAL_CAPSULE | Freq: Three times a day (TID) | ORAL | Status: DC
  Administered 2015-09-19 – 2015-09-22 (×10): 300 mg via ORAL
  Filled 2015-09-19 (×10): qty 1

## 2015-09-19 NOTE — Progress Notes (Signed)
PATIENT DETAILS Name: Melanie Crane Age: 40 y.o. 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 Repeat blood cultures on 4/3 negative so far. Underwent TEE on 4/7-which shows a large vegetation-ID recommended that we consult CTVS (pending-called on 4/7) to see if patient is a candidate for surgery (although doubt at this time).Seen by psychiatry, patient deemed not to have capacity-is willing  continue IV antibiotics and SNF placement if needed. Place PICC line when okay with infectious disease.  Acute hypoxemic respiratory failure: Likely due to septic emboli.Recent CTA Chest 4/1 negative for Pul Embolism. Chest x-ray 4/6 negative for major abnormalities except for known septic emboli/nodular lesions. Continue to provide supportive care and taper off oxygen when possible. Not sure if anxiety is playing a role.  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  Chronic pain syndrome: Claims to have chronic chest pain- for which she abuses opana (intravenously)-claims takes around 40 mg TID-after discussion with psychiatry-we have increased MS Contin and have increased dosage for oxycodone for breakthrough pain.  Currently pain seems relatively well-controlled-with MS Contin to 75 mg twice a day.  Difficult situation, in order to ensure compliance to medication and procedure she is going to need adequate pain control, once all procedures have been completed, we will need to slowly start tapering off narcotics.   History of IVDA (opiates-crushes opana): Counseled extensively.  Mild thrombocytopenia: Improving. Watch closely while on Lovenox.  Anemia: Likely secondary to acute illness/endocarditis-follow periodically.  Suicidal ideation: Reported that patient made multiple statements about "wanting to die", psych was consulted who recommended IVC. IVC placed. Dr. Elsie Saas, psychiatry, consulted with patient and determined IVC will remain in place as long as patient is inpatient-IVC can be rescinded when patient is discharged to SNF.   History of RUE DVT: PICC-line related during 09/2014 admission- had lupus anticoagulant positive-Dr. Fausto Skillern with hematology-Dr.Kale-and felt that patient did not require anticoagulation in this setting furthermore, patient is an active IVDA-noncompliant to antibiotics-and will be a very poor long-term anticoagulation candidate.  Chronic hepatitis C: follow up with ID clinic.   Disposition: Remain inpatient-SNF in the next few days-patient is agreeable  Antimicrobial agents  See below  Anti-infectives    Start     Dose/Rate Route Frequency Ordered Stop   09/19/15 1400  rifampin (RIFADIN) capsule 300 mg     300 mg Oral 3 times per day 09/19/15 1211     09/17/15 0500  vancomycin (VANCOCIN) 1,500 mg in sodium chloride 0.9 % 500 mL IVPB     1,500 mg 250 mL/hr over 120 Minutes Intravenous Every 12 hours 09/16/15 1833     09/14/15 0400  vancomycin (VANCOCIN) 1,250 mg in sodium chloride 0.9 % 250 mL IVPB  Status:  Discontinued     1,250 mg 166.7 mL/hr over 90  Minutes Intravenous Every 12 hours 09/13/15 1902 09/16/15 1833   09/13/15 1000  gentamicin  (GARAMYCIN) 70 mg in dextrose 5 % 50 mL IVPB     70 mg 103.5 mL/hr over 30 Minutes Intravenous Every 8 hours 09/13/15 0938     09/12/15 1415  rifampin (RIFADIN) 300 mg in sodium chloride 0.9 % 100 mL IVPB  Status:  Discontinued     300 mg 200 mL/hr over 30 Minutes Intravenous 3 times per day 09/12/15 1408 09/19/15 1210   09/12/15 1100  ceFAZolin (ANCEF) IVPB 2g/100 mL premix  Status:  Discontinued     2 g 200 mL/hr over 30 Minutes Intravenous Every 8 hours 09/12/15 0957 09/13/15 0907   09/11/15 0900  vancomycin (VANCOCIN) IVPB 1000 mg/200 mL premix  Status:  Discontinued     1,000 mg 200 mL/hr over 60 Minutes Intravenous Every 8 hours 09/11/15 0644 09/13/15 1841   09/11/15 0645  piperacillin-tazobactam (ZOSYN) IVPB 3.375 g  Status:  Discontinued     3.375 g 12.5 mL/hr over 240 Minutes Intravenous 3 times per day 09/11/15 0644 09/12/15 0946   09/11/15 0100  ceFEPIme (MAXIPIME) 2 g in dextrose 5 % 50 mL IVPB     2 g 100 mL/hr over 30 Minutes Intravenous  Once 09/11/15 0056 09/11/15 0352   09/11/15 0045  vancomycin (VANCOCIN) IVPB 1000 mg/200 mL premix     1,000 mg 200 mL/hr over 60 Minutes Intravenous  Once 09/11/15 0040 09/11/15 0207      DVT Prophylaxis: Prophylactic Lovenox   Code Status: Full code   Family Communication None at bedsdie  Procedures: None  CONSULTS:  ID, psychiatry and Palliative care  Time spent 30 minutes-Greater than 50% of this time was spent in counseling, explanation of diagnosis, planning of further management, and coordination of care.  MEDICATIONS: Scheduled Meds: . clonazePAM  1 mg Oral BID  . enoxaparin (LOVENOX) injection  40 mg Subcutaneous Q24H  . gentamicin  70 mg Intravenous Q8H  . magnesium oxide  400 mg Oral Daily  . metoprolol tartrate  25 mg Oral BID  . morphine  75 mg Oral Q12H  . pantoprazole  40 mg Oral Q1200  . polyethylene glycol  17 g Oral Daily  . rifampin  300 mg Oral 3 times per day  . senna  1 tablet Oral QHS  .  sodium chloride flush  3 mL Intravenous Q12H  . vancomycin  1,500 mg Intravenous Q12H   Continuous Infusions:  PRN Meds:.acetaminophen **OR** [DISCONTINUED] acetaminophen, bisacodyl, diphenoxylate-atropine, gi cocktail, methocarbamol, naLOXone (NARCAN)  injection, oxyCODONE, sodium phosphate    PHYSICAL EXAM: Vital signs in last 24 hours: Filed Vitals:   09/18/15 2242 09/19/15 0139 09/19/15 0502 09/19/15 0940  BP: 96/68 94/63 95/60  105/57  Pulse:  102 101 103  Temp:  99.2 F (37.3 C) 98.9 F (37.2 C) 98.7 F (37.1 C)  TempSrc:  Oral Oral Oral  Resp:  18 20 20   Height:      Weight:   67.2 kg (148 lb 2.4 oz)   SpO2:  96% 96% 94%    Weight change: -1.112 kg (-2 lb 7.2 oz) Filed Weights   09/15/15 0559 09/18/15 0537 09/19/15 0502  Weight: 70.2 kg (154 lb 12.2 oz) 68.312 kg (150 lb 9.6 oz) 67.2 kg (148 lb 2.4 oz)   Body mass index is 25.42 kg/(m^2).   Gen Exam: Awake and alert with clear speech.  Sleeping comfortably in bed without any distress Neck: Supple, No JVD.  Chest: B/L Clear.   CVS: S1 S2 Regular, no murmurs.  Abdomen: soft, BS +, non tender, non distended.  Extremities: no edema, lower extremities warm to touch. Neurologic: Non Focal.  Skin: No Rash.   Wounds: N/A.    Intake/Output from previous day:  Intake/Output Summary (Last 24 hours) at 09/19/15 1442 Last data filed at 09/19/15 0946  Gross per 24 hour  Intake 1111.75 ml  Output      0 ml  Net 1111.75 ml     LAB RESULTS: CBC  Recent Labs Lab 09/14/15 1009 09/15/15 0530 09/16/15 0529 09/17/15 0548 09/18/15 0737  WBC 7.7 8.8 10.4 16.1* 12.1*  HGB 10.5* 9.8* 10.4* 11.4* 9.6*  HCT 33.9* 30.5* 33.3* 36.0 31.5*  PLT 111* 141* 135* 165 128*  MCV 74.7* 75.7* 76.7* 77.3* 77.2*  MCH 23.1* 24.3* 24.0* 24.5* 23.5*  MCHC 31.0 32.1 31.2 31.7 30.5  RDW 17.4* 17.8* 19.0* 19.2* 18.6*  LYMPHSABS  --  1.9  --   --   --   MONOABS  --  0.8  --   --   --   EOSABS  --  0.0  --   --   --   BASOSABS  --  0.0   --   --   --     Chemistries   Recent Labs Lab 09/13/15 0232 09/14/15 1009 09/15/15 0530 09/16/15 0529 09/19/15 0753  NA 140 137 138 137 135  K 3.7 3.5 5.2* 4.8 4.5  CL 109 105 106 102 104  CO2 22 19* 20* 23 17*  GLUCOSE 131* 216* 91 92 135*  BUN 16 12 7 7 11   CREATININE 0.90 0.90 0.71 0.82 0.93  CALCIUM 7.8* 7.9* 8.0* 8.1* 7.9*  MG 1.6*  --   --   --   --     CBG:  Recent Labs Lab 09/12/15 1626 09/12/15 1927 09/12/15 2353 09/13/15 0408  GLUCAP 108* 119* 191* 114*    GFR Estimated Creatinine Clearance: 76.5 mL/min (by C-G formula based on Cr of 0.93).  Coagulation profile  Recent Labs Lab 09/13/15 0232 09/15/15 0530  INR 1.32 1.35    Cardiac Enzymes No results for input(s): CKMB, TROPONINI, MYOGLOBIN in the last 168 hours.  Invalid input(s): CK  Invalid input(s): POCBNP No results for input(s): DDIMER in the last 72 hours. No results for input(s): HGBA1C in the last 72 hours. No results for input(s): CHOL, HDL, LDLCALC, TRIG, CHOLHDL, LDLDIRECT in the last 72 hours. No results for input(s): TSH, T4TOTAL, T3FREE, THYROIDAB in the last 72 hours.  Invalid input(s): FREET3 No results for input(s): VITAMINB12, FOLATE, FERRITIN, TIBC, IRON, RETICCTPCT in the last 72 hours. No results for input(s): LIPASE, AMYLASE in the last 72 hours.  Urine Studies No results for input(s): UHGB, CRYS in the last 72 hours.  Invalid input(s): UACOL, UAPR, USPG, UPH, UTP, UGL, UKET, UBIL, UNIT, UROB, ULEU, UEPI, UWBC, URBC, UBAC, CAST, UCOM, BILUA  MICROBIOLOGY: Recent Results (from the past 240 hour(s))  Blood culture (routine x 2)     Status: None   Collection Time: 09/10/15 11:50 PM  Result Value Ref Range Status   Specimen Description BLOOD RIGHT ARM  Final   Special Requests BOTTLES DRAWN AEROBIC AND ANAEROBIC  Final   Culture NO GROWTH 5 DAYS  Final   Report Status 09/16/2015 FINAL  Final  Blood culture (routine x 2)     Status: None   Collection Time:  09/10/15 11:55 PM  Result Value Ref  Range Status   Specimen Description BLOOD RIGHT HAND  Final   Special Requests BOTTLES DRAWN AEROBIC AND ANAEROBIC  Final   Culture NO GROWTH 5 DAYS  Final   Report Status 09/16/2015 FINAL  Final  Culture, blood (single) w Reflex to ID Panel     Status: None   Collection Time: 09/11/15 12:40 AM  Result Value Ref Range Status   Specimen Description BLOOD RIGHT ARM  Final   Special Requests IN PEDIATRIC BOTTLE  Final   Culture  Setup Time   Final    GRAM POSITIVE COCCI IN CLUSTERS PEDIATRIC BOTTLE CALLED TO FLYNT,F RN 09/11/15 2046 WOOTEN,K CONFIRMED BY V WILKINS    Culture METHICILLIN RESISTANT STAPHYLOCOCCUS AUREUS  Final   Report Status 09/13/2015 FINAL  Final   Organism ID, Bacteria METHICILLIN RESISTANT STAPHYLOCOCCUS AUREUS  Final      Susceptibility   Methicillin resistant staphylococcus aureus - MIC*    CIPROFLOXACIN >=8 RESISTANT Resistant     ERYTHROMYCIN >=8 RESISTANT Resistant     GENTAMICIN <=0.5 SENSITIVE Sensitive     OXACILLIN >=4 RESISTANT Resistant     TETRACYCLINE <=1 SENSITIVE Sensitive     VANCOMYCIN 1 SENSITIVE Sensitive     TRIMETH/SULFA <=10 SENSITIVE Sensitive     CLINDAMYCIN <=0.25 SENSITIVE Sensitive     RIFAMPIN <=0.5 SENSITIVE Sensitive     Inducible Clindamycin NEGATIVE Sensitive     * METHICILLIN RESISTANT STAPHYLOCOCCUS AUREUS  MRSA PCR Screening     Status: Abnormal   Collection Time: 09/11/15  6:54 PM  Result Value Ref Range Status   MRSA by PCR POSITIVE (A) NEGATIVE Final    Comment:        The GeneXpert MRSA Assay (FDA approved for NASAL specimens only), is one component of a comprehensive MRSA colonization surveillance program. It is not intended to diagnose MRSA infection nor to guide or monitor treatment for MRSA infections. RESULT CALLED TO, READ BACK BY AND VERIFIED WITH: T.CRITE,RN 09/11/15  BY V.WILKINS   Culture, blood (Routine X 2) w Reflex to ID Panel     Status: None    Collection Time: 09/13/15  4:05 PM  Result Value Ref Range Status   Specimen Description BLOOD LEFT ANTECUBITAL  Final   Special Requests IN PEDIATRIC BOTTLE 3CC  Final   Culture NO GROWTH 5 DAYS  Final   Report Status 09/18/2015 FINAL  Final  Culture, blood (Routine X 2) w Reflex to ID Panel     Status: None   Collection Time: 09/13/15  4:15 PM  Result Value Ref Range Status   Specimen Description BLOOD LEFT ANTECUBITAL  Final   Special Requests IN PEDIATRIC BOTTLE 0.5CC  Final   Culture NO GROWTH 5 DAYS  Final   Report Status 09/18/2015 FINAL  Final    RADIOLOGY STUDIES/RESULTS: Dg Chest 2 View  09/12/2015  CLINICAL DATA:  Respiratory failure. EXAM: CHEST  2 VIEW COMPARISON:  September 11, 2015. FINDINGS: Stable cardiomegaly. No pneumothorax is noted. Mild bibasilar opacities are noted concerning for subsegmental atelectasis or possibly inflammation. Bony thorax is unremarkable. IMPRESSION: Mild bibasilar opacities concerning for subsegmental atelectasis or possibly inflammation. Electronically Signed   By: Lupita Raider, M.D.   On: 09/12/2015 09:11   Dg Chest 2 View  09/10/2015  CLINICAL DATA:  Acute onset of generalized chest pain and shortness of breath. Drowsiness. Initial encounter. EXAM: CHEST  2 VIEW COMPARISON:  CTA of the chest performed 04/10/2015 FINDINGS: Right midlung density, measuring 3.1 cm,  demonstrates increased density compared to prior studies. This may reflect residual or recurrent cavitating pneumonia or abscess, though a mass cannot be excluded. No pleural effusion or pneumothorax is seen. The heart is normal in size. No acute osseous abnormalities are identified. IMPRESSION: Right midlung density, measuring 3.1 cm, demonstrates increased density compared to prior studies. This may reflect residual or recurrent cavitating pneumonia or abscess, though a mass cannot be excluded. CT of the chest could be considered for further evaluation, when and as deemed clinically  appropriate. Electronically Signed   By: Roanna RaiderJeffery  Chang M.D.   On: 09/10/2015 23:23   Ct Angio Chest Pe W/cm &/or Wo Cm  09/11/2015  CLINICAL DATA:  Chronic nonradiating chest pain, fever, shortness of breath, cough and mild abdominal pain. Initial encounter. EXAM: CT ANGIOGRAPHY CHEST WITH CONTRAST TECHNIQUE: Multidetector CT imaging of the chest was performed using the standard protocol during bolus administration of intravenous contrast. Multiplanar CT image reconstructions and MIPs were obtained to evaluate the vascular anatomy. CONTRAST:  50mL OMNIPAQUE IOHEXOL 350 MG/ML SOLN COMPARISON:  Chest radiograph performed 09/10/2015, and CTA of the chest performed 04/10/2015 FINDINGS: There is no evidence of significant pulmonary embolus. Multiple nodular opacities are noted scattered throughout the lungs, many of which demonstrate central cavitation, most compatible with diffuse bilateral septic emboli. Underlying trace right-sided pleural fluid is noted. No pneumothorax is seen. Scattered coronary artery calcifications are seen. Prominent subcarinal and right hilar nodes are seen, measuring 1.6 cm and 1.2 cm in short axis. No pericardial effusion is identified. The great vessels are grossly unremarkable in appearance. Postoperative change is noted about the right atrium and right ventricle. No axillary lymphadenopathy is seen. The visualized portions of the thyroid gland are unremarkable in appearance. The visualized portions of the liver are unremarkable. The spleen is somewhat bulky, but only borderline prominent in length. Trace ascites is suggested noted tracking about the liver and spleen. No acute osseous abnormalities are seen. Review of the MIP images confirms the above findings. IMPRESSION: 1. No evidence of significant pulmonary embolus. 2. Multiple nodular opacities scattered throughout the lungs, many of which demonstrate central cavitation, most compatible with diffuse bilateral septic emboli, given  the patient's history. 3. Underlying trace right-sided pleural fluid noted. 4. Prominent mediastinal and right hilar nodes noted, likely reflecting the underlying infection. 5. Scattered coronary artery calcifications seen. 6. Suggestion of trace ascites at the upper abdomen. These results were called by telephone at the time of interpretation on 09/11/2015 at 2:49 am to Dr. Zadie RhineNALD WICKLINE, who verbally acknowledged these results. Electronically Signed   By: Roanna RaiderJeffery  Chang M.D.   On: 09/11/2015 02:49   Dg Chest Port 1 View  09/16/2015  CLINICAL DATA:  Shortness of breath. EXAM: PORTABLE CHEST 1 VIEW COMPARISON:  09/12/2015.  CT 09/11/2015. FINDINGS: Heart size normal. Persistent nodular densities/infiltrates are again noted throughout both lungs. Cavitary nodular densities are noted on prior recent CT of 09/11/2015. No pleural effusion pneumothorax. Stable cardiomegaly. No acute bony abnormality. IMPRESSION: Persistent nodular opacities noted throughout both lungs. Similar findings noted on prior exams. Cavitary nodular infiltrates were noted on prior CT of 09/11/2015. Electronically Signed   By: Maisie Fushomas  Register   On: 09/16/2015 08:12   Dg Chest Portable 1 View  09/11/2015  CLINICAL DATA:  Sepsis, endocarditis, fever EXAM: PORTABLE CHEST 1 VIEW COMPARISON:  Portable exam 1548 hours compared to 09/10/2015 and correlated with chest CT of 09/11/2015 FINDINGS: Normal size of cardiac silhouette post TVR. Mediastinal contours and pulmonary vascularity normal.  Lungs clear. No pleural effusion or pneumothorax. Bones unremarkable. IMPRESSION: No acute abnormalities. Electronically Signed   By: Ulyses Southward M.D.   On: 09/11/2015 15:58    Jeoffrey Massed, MD  [facility] Pager:[phone]  If 7PM-7AM, please contact night-coverage [email or web address] Password [email or web address] 09/19/2015, 2:42 PM   LOS: 8 days

## 2015-09-19 NOTE — Progress Notes (Signed)
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 The patient is currently hospitalized with prosthetic valve endocarditis after [place] tissue [place]. She presented back with sepsis and staphylococcal bacteremia -MRSA -  which is now cleared with IV antibiotics. Echocardiogram demonstrates a vegetation on the tricuspid valve, the valve is structurally intact without significant tricuspid regurgitation. The patient has had septic pulmonary emboli which have been demonstrated on CT scan of chest. The patient is currently culture negative on IV antibiotic. She has no symptoms of significant peripheral edema and is on room air. Her echocardiogram shows right-sided cardiac chamber enlargement with RV dilatation and reduced systolic function. The patient has multiple medical problems as a consequence of her chronic drug abuse and would not tolerate or survive a second tricuspid valve replacement. Continued IV antibiotic-medical therapy is the best long-term option for this patient.  Objective: Vital signs in last 24 hours: Temp:  [98.7 F (37.1 C)-100 F (37.8 C)] 98.7 F (37.1 C) (04/09 0940) Pulse Rate:  [101-109] 103 (04/09 0940) Cardiac Rhythm:  [-] Sinus tachycardia (04/09 0700) Resp:  [18-35] 20 (04/09 0940) BP: (94-105)/(57-68) 105/57 mmHg (04/09 0940) SpO2:  [94 %-96 %] 94 % (04/09 0940) Weight:  [148 lb 2.4 oz (67.2 kg)] 148 lb 2.4 oz (67.2 kg) (04/09 0502)  Hemodynamic parameters for last 24 hours:    Intake/Output from previous day: 04/08 0701 - 04/09 0700 In: 1231.8 [P.O.:480; IV Piggyback:751.8] Out: -  Intake/Output this shift: Total I/O In: 120 [P.O.:120] Out: -   Chronically  ill-appearing malnourished Caucasian female lying comfortably supine in bed Heart rate regular, no cardiac murmur Well-healed right anterior chest thoracotomy incision No peripheral edema  Lab Results:  Recent Labs  09/17/15 0548 09/18/15 0737  WBC 16.1* 12.1*  HGB 11.4* 9.6*  HCT 36.0 31.5*  PLT 165 128*   BMET:  Recent Labs  09/19/15 0753  NA 135  K 4.5  CL 104  CO2 17*  GLUCOSE 135*  BUN 11  CREATININE 0.93  CALCIUM 7.9*    PT/INR: No results for input(s): LABPROT, INR in the last 72 hours. ABG    Component Value Date/Time   PHART 7.464* 09/12/2015 0249   HCO3 18.1* 09/12/2015 0249   TCO2 19 09/12/2015 0249   ACIDBASEDEF 5.0* 09/12/2015 0249   O2SAT 91.0 09/12/2015 0249   CBG (last 3)  No results for input(s): GLUCAP in the last 72 hours.  Assessment/Plan: S/P Procedure(s) (LRB): TRANSESOPHAGEAL ECHOCARDIOGRAM (TEE) (N/A) Not a candidate for redo TVR Continue medical therapy   LOS: 8 days    Kathlee Nationseter Van Trigt III 09/19/2015

## 2015-09-19 NOTE — Progress Notes (Signed)
Pharmacy Antibiotic Note Melanie Crane is a 40 y.o. female admitted on 09/10/2015 MRSA bacteremia in setting of recurrent prosthetic valve endocarditis.   Repeat blood cultures negative and final 4/3 and 3/31   Plan: Continue gentimicin 70mg  q8h  (stop date 4/17) Continue vancomycin 1500 mg iv Q 12 hours (stop date 5/15) Continue rifampin 300 mg iv Q 8 hours (stop date 5/15)  Next vancomycin trough 4/13 if not discharged   Height: 5\' 4"  (162.6 cm) Weight: 148 lb 2.4 oz (67.2 kg) IBW/kg (Calculated) : 54.7  Temp (24hrs), Avg:99.4 F (37.4 C), Min:98.7 F (37.1 C), Max:100.4 F (38 C)   Recent Labs Lab 09/13/15 0232 09/13/15 1619 09/14/15 1009 09/15/15 0530 09/16/15 0529 09/16/15 1542 09/17/15 0548 09/18/15 0737 09/19/15 0753  WBC 8.6  --  7.7 8.8 10.4  --  16.1* 12.1*  --   CREATININE 0.90  --  0.90 0.71 0.82  --   --   --  0.93  VANCOTROUGH  --  29*  --   --   --  15  --   --   --   GENTTROUGH  --   --  0.7  --   --   --   --   --   --     Estimated Creatinine Clearance: 76.5 mL/min (by C-G formula based on Cr of 0.93).    No Known Allergies  Antimicrobials this admission: Vanc 4/1>> Rifampin 4/2 >> Gentamycin 4/3 >>   Cefazolin 4/2>> 4/3 Zosyn 4/1>>4/2 Cefepime 4/1 x 1 dose  Pertinent levels this admission: 4/3 VT 29 on 1 gram q8h and SCr 0.91 4/4 Gentamicin trough 0.7  On 70mg  IV q8h- continue  4/6 Vancomycin trough = 15 on 1250mg  IV q12h- incr to 1500mg  q12h  Microbiology results: 4/1 Blood - MRSA 4/1 MRSA PCR - POS 4/3 Blood x2negative 3/31 blood x2 NEG  Thank you  Okey RegalLisa Gualberto Wahlen, PharmD [phone] 09/19/2015 12:06 PM

## 2015-09-20 LAB — BASIC METABOLIC PANEL
ANION GAP: 11 (ref 5–15)
BUN: 14 mg/dL (ref 6–20)
CALCIUM: 8.1 mg/dL — AB (ref 8.9–10.3)
CO2: 21 mmol/L — ABNORMAL LOW (ref 22–32)
Chloride: 102 mmol/L (ref 101–111)
Creatinine, Ser: 0.99 mg/dL (ref 0.44–1.00)
GFR calc Af Amer: >60 mL/min (ref >60)
GLUCOSE: 112 mg/dL — AB (ref 65–99)
Potassium: 4.7 mmol/L (ref 3.5–5.1)
Sodium: 134 mmol/L — ABNORMAL LOW (ref 135–145)

## 2015-09-20 LAB — GENTAMICIN LEVEL, TROUGH: Gentamicin Trough: 2.2 ug/mL (ref 0.5–2.0)

## 2015-09-20 MED ORDER — GENTAMICIN IN SALINE 1.2-0.9 MG/ML-% IV SOLN
60.0000 mg | Freq: Two times a day (BID) | INTRAVENOUS | Status: DC
  Administered 2015-09-20 – 2015-09-22 (×4): 60 mg via INTRAVENOUS
  Filled 2015-09-20 (×6): qty 50

## 2015-09-20 NOTE — Progress Notes (Signed)
RN called pharmacist to inform her of pt's Gentamycin trough level. Pharmacist working on changing pt's dose at this time. Tobin Chadracy Brecken Dewoody 09/20/2015 2:53 PM

## 2015-09-20 NOTE — Clinical Social Work Psych Note (Signed)
Psych CSW received notification that patient was unable to get PICC placed today due to verbiage documented by psychiatrist that states patient has "lost her capacity to make her own medical decisions" and should remain IVC'd (involuntarily committed) until time of discharge.  Patient is unable to sign her own consent for PICC placement until re-evaluated by psychiatrist and deemed to be able to make her own decisions.  MD feels that psychiatry re-evaluation is appropriate and is agreeable to do peer-to-peer for this consult.  Of note, patient's IVC paperwork expires on 09/21/2015.  Patient has no payer source and will need SNF placement at time of discharge due to need for IV abx.  At this time, patient remains agreeable to discharge plans.  Unit CSW facilitating placement under the guidance of the CSW Medical Director as patient will be a LOG (letter of guarantee) placement.  Psych CSW spoke with IV team who states they will not be able to place PICC today regardless of documentation due to staffing issues.  Patient should receive PICC tomorrow (09/21/2015).  MD and unit CSW updated.  Vickii PennaGina Brantley Naser, LCSW [phone]  [id number]; 2S 15-16 and Hospital Psychiatric Service Line Licensed Clinical Social Worker

## 2015-09-20 NOTE — Progress Notes (Signed)
PATIENT DETAILS Name: Melanie Crane Age: 40 y.o. 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 Initially hospital course was complicated by noncooperation and suicidal ideation, seen by psychiatry, after prolonged discussion and adjustment of narcotic regimen she is much improved. She is no longer suicidal. Psychiatry had placed her on IVC along with a sitter at bedside. Since, she is much more compliant and cooperative,and is no longer suicidal, psychiatry has been reconsulted on 4/10 to see if we can rescind IVC.  Subjective: Pain significantly well controlled. She is calm, quiet and is willing to go to a SNF for prolonged IV antibiotics.   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 Repeat blood cultures on 4/3 negative.ar. Underwent TEE on 4/7-which shows a large vegetation-subsequently seen by cardiothoracic surgery-recommendations are to continue with IV antibiotics. Place PICC line today. Await recommendations from infectious disease regarding discharge/duration of antibiotic regimen on discharge.  Acute hypoxemic respiratory failure: Likely due to septic emboli.Recent CTA Chest 4/1 negative for Pul Embolism. Chest x-ray 4/6 negative for major abnormalities except for known septic emboli/nodular lesions. Continue to provide supportive care and taper off oxygen when possible. Not sure if anxiety is playing a role.  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  Chronic pain syndrome: Claims to have chronic chest pain- for which she abuses opana (intravenously)-claims takes around 40 mg TID-after discussion with psychiatry-we have increased MS Contin and have increased dosage for oxycodone for breakthrough pain. Currently pain seems relatively well-controlled-with MS Contin to 75 mg twice a day.  Difficult situation, in order to ensure compliance to medication and procedure she is going to need adequate pain control, once all procedures have been completed, we will need to slowly start tapering off narcotics.   History of IVDA (opiates-crushes opana): Counseled extensively.  Mild thrombocytopenia: Improving. Watch closely while on Lovenox.  Anemia: Likely secondary to acute illness/endocarditis-follow periodically.  Suicidal ideation: Reported that patient made multiple statements about "wanting to die", psych was consulted who recommended IVC. IVC placed. Dr. Elsie Saas, psychiatry, consulted with patient and determined IVC will remain in place as long as patient is inpatient- psychiatry reconsulted on 4/10 to see if we can now resend IVC paperwork. Await further recommendations.   History of RUE DVT: PICC-line related during 09/2014 admission- had lupus anticoagulant positive-Dr. Fausto Skillern with hematology-Dr.Kale-and felt that patient did not require anticoagulation in this setting furthermore, patient is an active IVDA-noncompliant to antibiotics-and will be a very poor long-term anticoagulation candidate.  Chronic hepatitis C: follow up with ID clinic.   Disposition: Remain inpatient-SNF in the next few days-patient is agreeable  Antimicrobial agents  See below  Anti-infectives    Start     Dose/Rate Route Frequency Ordered Stop   09/19/15 1400  rifampin (RIFADIN) capsule 300 mg     300 mg Oral 3 times per day 09/19/15 1211     09/17/15 0500  vancomycin (VANCOCIN) 1,500 mg in sodium chloride 0.9 % 500 mL IVPB     1,500  mg  250 mL/hr over 120 Minutes Intravenous Every 12 hours 09/16/15 1833     09/14/15 0400  vancomycin (VANCOCIN) 1,250 mg in sodium chloride 0.9 % 250 mL IVPB  Status:  Discontinued     1,250 mg 166.7 mL/hr over 90 Minutes Intravenous Every 12 hours 09/13/15 1902 09/16/15 1833   09/13/15 1000  gentamicin (GARAMYCIN) 70 mg in dextrose 5 % 50 mL IVPB     70 mg 103.5 mL/hr over 30 Minutes Intravenous Every 8 hours 09/13/15 0938     09/12/15 1415  rifampin (RIFADIN) 300 mg in sodium chloride 0.9 % 100 mL IVPB  Status:  Discontinued     300 mg 200 mL/hr over 30 Minutes Intravenous 3 times per day 09/12/15 1408 09/19/15 1210   09/12/15 1100  ceFAZolin (ANCEF) IVPB 2g/100 mL premix  Status:  Discontinued     2 g 200 mL/hr over 30 Minutes Intravenous Every 8 hours 09/12/15 0957 09/13/15 0907   09/11/15 0900  vancomycin (VANCOCIN) IVPB 1000 mg/200 mL premix  Status:  Discontinued     1,000 mg 200 mL/hr over 60 Minutes Intravenous Every 8 hours 09/11/15 0644 09/13/15 1841   09/11/15 0645  piperacillin-tazobactam (ZOSYN) IVPB 3.375 g  Status:  Discontinued     3.375 g 12.5 mL/hr over 240 Minutes Intravenous 3 times per day 09/11/15 0644 09/12/15 0946   09/11/15 0100  ceFEPIme (MAXIPIME) 2 g in dextrose 5 % 50 mL IVPB     2 g 100 mL/hr over 30 Minutes Intravenous  Once 09/11/15 0056 09/11/15 0352   09/11/15 0045  vancomycin (VANCOCIN) IVPB 1000 mg/200 mL premix     1,000 mg 200 mL/hr over 60 Minutes Intravenous  Once 09/11/15 0040 09/11/15 0207      DVT Prophylaxis: Prophylactic Lovenox   Code Status: Full code   Family Communication None at bedsdie  Procedures: None  CONSULTS:  ID, psychiatry and Palliative care  Time spent 25 minutes-Greater than 50% of this time was spent in counseling, explanation of diagnosis, planning of further management, and coordination of care.  MEDICATIONS: Scheduled Meds: . clonazePAM  1 mg Oral BID  . enoxaparin (LOVENOX) injection  40 mg  Subcutaneous Q24H  . gentamicin  70 mg Intravenous Q8H  . magnesium oxide  400 mg Oral Daily  . metoprolol tartrate  25 mg Oral BID  . morphine  75 mg Oral Q12H  . pantoprazole  40 mg Oral Q1200  . polyethylene glycol  17 g Oral Daily  . rifampin  300 mg Oral 3 times per day  . senna  1 tablet Oral QHS  . sodium chloride flush  3 mL Intravenous Q12H  . vancomycin  1,500 mg Intravenous Q12H   Continuous Infusions:  PRN Meds:.acetaminophen **OR** [DISCONTINUED] acetaminophen, bisacodyl, diphenoxylate-atropine, gi cocktail, methocarbamol, naLOXone (NARCAN)  injection, oxyCODONE, sodium phosphate    PHYSICAL EXAM: Vital signs in last 24 hours: Filed Vitals:   09/19/15 0940 09/19/15 2031 09/20/15 0500 09/20/15 0933  BP: 105/57 96/60 88/60  101/66  Pulse: 103 73 82 139  Temp: 98.7 F (37.1 C) 98.5 F (36.9 C) 98.6 F (37 C)   TempSrc: Oral Oral Oral   Resp: Height:      Weight:   66.4 kg (146 lb 6.2 oz)   SpO2: 94% 94% 94%     Weight change: -0.8 kg (-1 lb 12.2 oz) Filed Weights   09/18/15 0537 09/19/15 0502 09/20/15 0500  Weight: 68.312 kg (150 lb 9.6 oz)  67.2 kg (148 lb 2.4 oz) 66.4 kg (146 lb 6.2 oz)   Body mass index is 25.11 kg/(m^2).   Gen Exam: Awake and alert with clear speech.  Lying comfortably in bed without any distress Neck: Supple, No JVD.   Chest: B/L Clear.   CVS: S1 S2 Regular, no murmurs.  Abdomen: soft, BS +, non tender, non distended.  Extremities: no edema, lower extremities warm to touch. Neurologic: Non Focal.  Skin: No Rash.   Wounds: N/A.    Intake/Output from previous day:  Intake/Output Summary (Last 24 hours) at 09/20/15 1324 Last data filed at 09/19/15 1733  Gross per 24 hour  Intake    360 ml  Output      0 ml  Net    360 ml     LAB RESULTS: CBC  Recent Labs Lab 09/14/15 1009 09/15/15 0530 09/16/15 0529 09/17/15 0548 09/18/15 0737  WBC 7.7 8.8 10.4 16.1* 12.1*  HGB 10.5* 9.8* 10.4* 11.4* 9.6*  HCT 33.9* 30.5*  33.3* 36.0 31.5*  PLT 111* 141* 135* 165 128*  MCV 74.7* 75.7* 76.7* 77.3* 77.2*  MCH 23.1* 24.3* 24.0* 24.5* 23.5*  MCHC 31.0 32.1 31.2 31.7 30.5  RDW 17.4* 17.8* 19.0* 19.2* 18.6*  LYMPHSABS  --  1.9  --   --   --   MONOABS  --  0.8  --   --   --   EOSABS  --  0.0  --   --   --   BASOSABS  --  0.0  --   --   --     Chemistries   Recent Labs Lab 09/14/15 1009 09/15/15 0530 09/16/15 0529 09/19/15 0753 09/20/15 0903  NA 137 138 137 135 134*  K 3.5 5.2* 4.8 4.5 4.7  CL 105 106 102 104 102  CO2 19* 20* 23 17* 21*  GLUCOSE 216* 91 92 135* 112*  BUN CREATININE 0.90 0.71 0.82 0.93 0.99  CALCIUM 7.9* 8.0* 8.1* 7.9* 8.1*    CBG: No results for input(s): GLUCAP in the last 168 hours.  GFR Estimated Creatinine Clearance: 71.5 mL/min (by C-G formula based on Cr of 0.99).  Coagulation profile  Recent Labs Lab 09/15/15 0530  INR 1.35    Cardiac Enzymes No results for input(s): CKMB, TROPONINI, MYOGLOBIN in the last 168 hours.  Invalid input(s): CK  Invalid input(s): POCBNP No results for input(s): DDIMER in the last 72 hours. No results for input(s): HGBA1C in the last 72 hours. No results for input(s): CHOL, HDL, LDLCALC, TRIG, CHOLHDL, LDLDIRECT in the last 72 hours. No results for input(s): TSH, T4TOTAL, T3FREE, THYROIDAB in the last 72 hours.  Invalid input(s): FREET3 No results for input(s): VITAMINB12, FOLATE, FERRITIN, TIBC, IRON, RETICCTPCT in the last 72 hours. No results for input(s): LIPASE, AMYLASE in the last 72 hours.  Urine Studies No results for input(s): UHGB, CRYS in the last 72 hours.  Invalid input(s): UACOL, UAPR, USPG, UPH, UTP, UGL, UKET, UBIL, UNIT, UROB, ULEU, UEPI, UWBC, URBC, UBAC, CAST, UCOM, BILUA  MICROBIOLOGY: Recent Results (from the past 240 hour(s))  Blood culture (routine x 2)     Status: None   Collection Time: 09/10/15 11:50 PM  Result Value Ref Range Status   Specimen Description BLOOD RIGHT ARM  Final    Special Requests BOTTLES DRAWN AEROBIC AND ANAEROBIC  Final   Culture NO GROWTH 5 DAYS  Final   Report Status 09/16/2015 FINAL  Final  Blood culture (routine x 2)     Status: None   Collection Time: 09/10/15 11:55 PM  Result Value Ref Range Status   Specimen Description BLOOD RIGHT HAND  Final   Special Requests BOTTLES DRAWN AEROBIC AND ANAEROBIC 5ML  Final   Culture NO GROWTH 5 DAYS  Final   Report Status 09/16/2015 FINAL  Final  Culture, blood (single) w Reflex to ID Panel     Status: None   Collection Time: 09/11/15 12:40 AM  Result Value Ref Range Status   Specimen Description BLOOD RIGHT ARM  Final   Special Requests IN PEDIATRIC BOTTLE 3ML  Final   Culture  Setup Time   Final    GRAM POSITIVE COCCI IN CLUSTERS PEDIATRIC BOTTLE CALLED TO FLYNT,F RN 09/11/15 2046 WOOTEN,K CONFIRMED BY V WILKINS    Culture METHICILLIN RESISTANT STAPHYLOCOCCUS AUREUS  Final   Report Status 09/13/2015 FINAL  Final   Organism ID, Bacteria METHICILLIN RESISTANT STAPHYLOCOCCUS AUREUS  Final      Susceptibility   Methicillin resistant staphylococcus aureus - MIC*    CIPROFLOXACIN >=8 RESISTANT Resistant     ERYTHROMYCIN >=8 RESISTANT Resistant     GENTAMICIN <=0.5 SENSITIVE Sensitive     OXACILLIN >=4 RESISTANT Resistant     TETRACYCLINE <=1 SENSITIVE Sensitive     VANCOMYCIN 1 SENSITIVE Sensitive     TRIMETH/SULFA <=10 SENSITIVE Sensitive     CLINDAMYCIN <=0.25 SENSITIVE Sensitive     RIFAMPIN <=0.5 SENSITIVE Sensitive     Inducible Clindamycin NEGATIVE Sensitive     * METHICILLIN RESISTANT STAPHYLOCOCCUS AUREUS  MRSA PCR Screening     Status: Abnormal   Collection Time: 09/11/15  6:54 PM  Result Value Ref Range Status   MRSA by PCR POSITIVE (A) NEGATIVE Final    Comment:        The GeneXpert MRSA Assay (FDA approved for NASAL specimens only), is one component of a comprehensive MRSA colonization surveillance program. It is not intended to diagnose MRSA infection nor to guide  or monitor treatment for MRSA infections. RESULT CALLED TO, READ BACK BY AND VERIFIED WITH: T.CRITE,RN 09/11/15 @2042  BY V.WILKINS   Culture, blood (Routine X 2) w Reflex to ID Panel     Status: None   Collection Time: 09/13/15  4:05 PM  Result Value Ref Range Status   Specimen Description BLOOD LEFT ANTECUBITAL  Final   Special Requests IN PEDIATRIC BOTTLE 3CC  Final   Culture NO GROWTH 5 DAYS  Final   Report Status 09/18/2015 FINAL  Final  Culture, blood (Routine X 2) w Reflex to ID Panel     Status: None   Collection Time: 09/13/15  4:15 PM  Result Value Ref Range Status   Specimen Description BLOOD LEFT ANTECUBITAL  Final   Special Requests IN PEDIATRIC BOTTLE 0.5CC  Final   Culture NO GROWTH 5 DAYS  Final   Report Status 09/18/2015 FINAL  Final    RADIOLOGY STUDIES/RESULTS: Dg Chest 2 View  09/12/2015  CLINICAL DATA:  Respiratory failure. EXAM: CHEST  2 VIEW COMPARISON:  September 11, 2015. FINDINGS: Stable cardiomegaly. No pneumothorax is noted. Mild bibasilar opacities are noted concerning for subsegmental atelectasis or possibly inflammation. Bony thorax is unremarkable. IMPRESSION: Mild bibasilar opacities concerning for subsegmental atelectasis or possibly inflammation. Electronically Signed   By: Lupita RaiderJames  Green Jr, M.D.   On: 09/12/2015 09:11   Dg Chest 2 View  09/10/2015  CLINICAL DATA:  Acute onset of generalized chest pain and shortness of breath. Drowsiness.  Initial encounter. EXAM: CHEST  2 VIEW COMPARISON:  CTA of the chest performed 04/10/2015 FINDINGS: Right midlung density, measuring 3.1 cm, demonstrates increased density compared to prior studies. This may reflect residual or recurrent cavitating pneumonia or abscess, though a mass cannot be excluded. No pleural effusion or pneumothorax is seen. The heart is normal in size. No acute osseous abnormalities are identified. IMPRESSION: Right midlung density, measuring 3.1 cm, demonstrates increased density compared to prior  studies. This may reflect residual or recurrent cavitating pneumonia or abscess, though a mass cannot be excluded. CT of the chest could be considered for further evaluation, when and as deemed clinically appropriate. Electronically Signed   By: Roanna Raider M.D.   On: 09/10/2015 23:23   Ct Angio Chest Pe W/cm &/or Wo Cm  09/11/2015  CLINICAL DATA:  Chronic nonradiating chest pain, fever, shortness of breath, cough and mild abdominal pain. Initial encounter. EXAM: CT ANGIOGRAPHY CHEST WITH CONTRAST TECHNIQUE: Multidetector CT imaging of the chest was performed using the standard protocol during bolus administration of intravenous contrast. Multiplanar CT image reconstructions and MIPs were obtained to evaluate the vascular anatomy. CONTRAST:  50mL OMNIPAQUE IOHEXOL 350 MG/ML SOLN COMPARISON:  Chest radiograph performed 09/10/2015, and CTA of the chest performed 04/10/2015 FINDINGS: There is no evidence of significant pulmonary embolus. Multiple nodular opacities are noted scattered throughout the lungs, many of which demonstrate central cavitation, most compatible with diffuse bilateral septic emboli. Underlying trace right-sided pleural fluid is noted. No pneumothorax is seen. Scattered coronary artery calcifications are seen. Prominent subcarinal and right hilar nodes are seen, measuring 1.6 cm and 1.2 cm in short axis. No pericardial effusion is identified. The great vessels are grossly unremarkable in appearance. Postoperative change is noted about the right atrium and right ventricle. No axillary lymphadenopathy is seen. The visualized portions of the thyroid gland are unremarkable in appearance. The visualized portions of the liver are unremarkable. The spleen is somewhat bulky, but only borderline prominent in length. Trace ascites is suggested noted tracking about the liver and spleen. No acute osseous abnormalities are seen. Review of the MIP images confirms the above findings. IMPRESSION: 1. No  evidence of significant pulmonary embolus. 2. Multiple nodular opacities scattered throughout the lungs, many of which demonstrate central cavitation, most compatible with diffuse bilateral septic emboli, given the patient's history. 3. Underlying trace right-sided pleural fluid noted. 4. Prominent mediastinal and right hilar nodes noted, likely reflecting the underlying infection. 5. Scattered coronary artery calcifications seen. 6. Suggestion of trace ascites at the upper abdomen. These results were called by telephone at the time of interpretation on 09/11/2015 at 2:49 am to Dr. Zadie Rhine, who verbally acknowledged these results. Electronically Signed   By: Roanna Raider M.D.   On: 09/11/2015 02:49   Dg Chest Port 1 View  09/16/2015  CLINICAL DATA:  Shortness of breath. EXAM: PORTABLE CHEST 1 VIEW COMPARISON:  09/12/2015.  CT 09/11/2015. FINDINGS: Heart size normal. Persistent nodular densities/infiltrates are again noted throughout both lungs. Cavitary nodular densities are noted on prior recent CT of 09/11/2015. No pleural effusion pneumothorax. Stable cardiomegaly. No acute bony abnormality. IMPRESSION: Persistent nodular opacities noted throughout both lungs. Similar findings noted on prior exams. Cavitary nodular infiltrates were noted on prior CT of 09/11/2015. Electronically Signed   By: Maisie Fus  Register   On: 09/16/2015 08:12   Dg Chest Portable 1 View  09/11/2015  CLINICAL DATA:  Sepsis, endocarditis, fever EXAM: PORTABLE CHEST 1 VIEW COMPARISON:  Portable exam 1548 hours compared to  09/10/2015 and correlated with chest CT of 09/11/2015 FINDINGS: Normal size of cardiac silhouette post TVR. Mediastinal contours and pulmonary vascularity normal. Lungs clear. No pleural effusion or pneumothorax. Bones unremarkable. IMPRESSION: No acute abnormalities. Electronically Signed   By: Ulyses Southward M.D.   On: 09/11/2015 15:58    Jeoffrey Massed, MD  [facility] Pager:[phone]  If 7PM-7AM,  please contact night-coverage [email or web address] Password [email or web address] 09/20/2015, 1:24 PM   LOS: 9 days

## 2015-09-20 NOTE — Progress Notes (Addendum)
Pharmacy Antibiotic Note  Melanie Crane is a 40 y.o. 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  Pharmacy has been consulted for vancomycin and gentamicin dosing. Her gentamicin trough was supratherapeutic today at 2.2 (goal <0.5-1).  Using patient's specific kinetics, will adjust her dose to 60 mg IV q12h - ideally this would give her a trough of ~0.8 and a peak of ~3.7. We will need to check another level at Css.   Plan: - Change gentamicin to 60 mg IV q12h starting tonight at 2000 - Continue vancomycin 1,500 mg IV q12h for now - Check a vancomycin trough tomorrow morning before the 0500 dose - Check another gentamicin level at Css - Watch renal function - F/u long term abx plans  Height: 5\' 4"  (162.6 cm) Weight: 146 lb 6.2 oz (66.4 kg) IBW/kg (Calculated) : 54.7  Temp (24hrs), Avg:98.5 F (36.9 C), Min:98.4 F (36.9 C), Max:98.6 F (37 C)   Recent Labs Lab 09/14/15 1009 09/15/15 0530 09/16/15 0529 09/16/15 1542 09/17/15 0548 09/18/15 0737 09/19/15 0753 09/20/15 0903  WBC 7.7 8.8 10.4  --  16.1* 12.1*  --   --   CREATININE 0.90 0.71 0.82  --   --   --  0.93 0.99  VANCOTROUGH  --   --   --  15  --   --   --   --   GENTTROUGH 0.7  --   --   --   --   --   --  2.2*    Estimated Creatinine Clearance: 71.5 mL/min (by C-G formula based on Cr of 0.99).    No Known Allergies  Antimicrobials this admission: Genatmicin 4/3 >>  Vancomycin 4/2 >>  Rifampin 4/2 >> Zosyn 4/1 >> 4/2 Cefepime 4/1 x 1  Dose adjustments this admission: 4/3 VT 29 on 1 gm IV q8h --> changed to 1,250 mg IV q12h 4/6 VT 15 on 1,250 mg IV q12h --> changed to 1,500 mg IV q12h  4/4 Gent trough 0.7 on 70 mg IV q8h 4/10 Gent trough 2.2 --> change to 60 mg IV q12h  Microbiology results: 4/1 Blood - MRSA 4/1 MRSA PCR - POS 4/3 Blood x2 NG final 3/31 blood x2 NEG  Thank you for allowing pharmacy to be a part of this  patient's care.  Cassie L. Roseanne RenoStewart, PharmD PGY2 Infectious Diseases Pharmacy Resident Pager: [phone] 09/20/2015 5:08 PM

## 2015-09-20 NOTE — Progress Notes (Signed)
Patient ID: Melanie Crane, female   DOB: 1976-03-22, 40 y.o.   MRN: [id number]         [facility]    Date of Admission:  09/10/2015   Total days of antibiotics 10         Principal Problem:   Acute bacterial endocarditis - right sided Active Problems:   MRSA bacteremia   IV drug abuse   Anemia   Thrombocytopenia, unspecified (HCC)   Hepatitis C   Hypokalemia   Septic embolism (HCC)   Prolonged Q-T interval on ECG   Narcotic dependence (HCC)   Palliative care encounter   Respiratory failure (HCC)   Staphylococcus aureus bacteremia with sepsis (HCC)   Acute renal failure (HCC)   Narcotic abuse   Dehydration   . clonazePAM  1 mg Oral BID  . enoxaparin (LOVENOX) injection  40 mg Subcutaneous Q24H  . gentamicin  60 mg Intravenous Q12H  . magnesium oxide  400 mg Oral Daily  . metoprolol tartrate  25 mg Oral BID  . morphine  75 mg Oral Q12H  . pantoprazole  40 mg Oral Q1200  . polyethylene glycol  17 g Oral Daily  . rifampin  300 mg Oral 3 times per day  . senna  1 tablet Oral QHS  . sodium chloride flush  3 mL Intravenous Q12H  . vancomycin  1,500 mg Intravenous Q12H    SUBJECTIVE: She is still having chest pain but overall is feeling better. She has been treated on several occasions in the past for tricuspid valve endocarditis. She states that each time she has had a PICC line and has gone to a skilled nursing facility to complete therapy. She denies ever having any problems tolerating her PICC for antibiotics. She denies ever having left the skilled nursing facility prior to completing therapy.  Review of Systems: Review of Systems  Constitutional: Positive for malaise/fatigue. Negative for fever, chills, weight loss and diaphoresis.  HENT: Negative for sore throat.   Respiratory: Positive for cough. Negative for sputum production and shortness of breath.        Unchanged smoker's cough.  Cardiovascular: Positive for chest pain.    Gastrointestinal: Negative for nausea, vomiting, abdominal pain and diarrhea.  Genitourinary: Negative for dysuria.  Musculoskeletal: Negative for myalgias and joint pain.  Skin: Negative for rash.  Neurological: Negative for dizziness and headaches.    Past Medical History  Diagnosis Date  . Anemia   . IVDU (intravenous drug user)   . 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 Hepatitis C   . Miscarriage     Five times  . ARF (acute renal failure) (HCC) 4//2017    Social History  Substance Use Topics  . Smoking status: Current Every Day Smoker -- 1.00 packs/day for 10 years    Types: Cigarettes  . Smokeless tobacco: Never Used     Comment: 1 1/2 ppd  . Alcohol Use: No    Family History  Problem Relation Age of Onset  . Hypertension Father   . Kidney failure Father   . Hyperlipidemia Mother   . CAD Mother   . Deafness Mother    No Known Allergies  OBJECTIVE: Filed Vitals:   09/19/15 2031 09/20/15 0500 09/20/15 0933 09/20/15 1546  BP: 96/60 88/60 101/66 91/66  Pulse: 73 82 139 99  Temp: 98.5 F (36.9 C) 98.6 F (37 C)  98.4 F (36.9 C)  TempSrc: Oral Oral  Oral  Resp: Height:      Weight:  146 lb 6.2 oz (66.4 kg)    SpO2: 94% 94%  93%   Body mass index is 25.11 kg/(m^2).  Physical Exam  Constitutional: She is oriented to person, place, and time.  She is alert and in no distress. She appears comfortable.  HENT:  Mouth/Throat: No oropharyngeal exudate.  Eyes: Conjunctivae are normal.  Cardiovascular: Normal rate and regular rhythm.   No murmur heard. Pulmonary/Chest: Breath sounds normal. She has no wheezes. She has no rales.  Abdominal: Soft. There is no tenderness.  Neurological: She is alert and oriented to person, place, and time.  Skin: No rash noted.  Psychiatric: Mood and affect normal.    Lab Results Lab Results  Component Value Date   WBC 12.1* 09/18/2015   HGB 9.6* 09/18/2015   HCT  31.5* 09/18/2015   MCV 77.2* 09/18/2015   PLT 128* 09/18/2015    Lab Results  Component Value Date   CREATININE 0.99 09/20/2015   BUN 14 09/20/2015   NA 134* 09/20/2015   K 4.7 09/20/2015   CL 102 09/20/2015   CO2 21* 09/20/2015    Lab Results  Component Value Date   ALT 9* 09/15/2015   AST 13* 09/15/2015   ALKPHOS 59 09/15/2015   BILITOT 0.8 09/15/2015     Microbiology: Recent Results (from the past 240 hour(s))  Blood culture (routine x 2)     Status: None   Collection Time: 09/10/15 11:50 PM  Result Value Ref Range Status   Specimen Description BLOOD RIGHT ARM  Final   Special Requests BOTTLES DRAWN AEROBIC AND ANAEROBIC  Final   Culture NO GROWTH 5 DAYS  Final   Report Status 09/16/2015 FINAL  Final  Blood culture (routine x 2)     Status: None   Collection Time: 09/10/15 11:55 PM  Result Value Ref Range Status   Specimen Description BLOOD RIGHT HAND  Final   Special Requests BOTTLES DRAWN AEROBIC AND ANAEROBIC  Final   Culture NO GROWTH 5 DAYS  Final   Report Status 09/16/2015 FINAL  Final  Culture, blood (single) w Reflex to ID Panel     Status: None   Collection Time: 09/11/15 12:40 AM  Result Value Ref Range Status   Specimen Description BLOOD RIGHT ARM  Final   Special Requests IN PEDIATRIC BOTTLE  Final   Culture  Setup Time   Final    GRAM POSITIVE COCCI IN CLUSTERS PEDIATRIC BOTTLE CALLED TO FLYNT,F RN 09/11/15 2046 WOOTEN,K CONFIRMED BY V WILKINS    Culture METHICILLIN RESISTANT STAPHYLOCOCCUS AUREUS  Final   Report Status 09/13/2015 FINAL  Final   Organism ID, Bacteria METHICILLIN RESISTANT STAPHYLOCOCCUS AUREUS  Final      Susceptibility   Methicillin resistant staphylococcus aureus - MIC*    CIPROFLOXACIN >=8 RESISTANT Resistant     ERYTHROMYCIN >=8 RESISTANT Resistant     GENTAMICIN <=0.5 SENSITIVE Sensitive     OXACILLIN >=4 RESISTANT Resistant     TETRACYCLINE <=1 SENSITIVE Sensitive     VANCOMYCIN 1 SENSITIVE Sensitive      TRIMETH/SULFA <=10 SENSITIVE Sensitive     CLINDAMYCIN <=0.25 SENSITIVE Sensitive     RIFAMPIN <=0.5 SENSITIVE Sensitive     Inducible Clindamycin NEGATIVE Sensitive     * METHICILLIN RESISTANT STAPHYLOCOCCUS AUREUS  MRSA PCR Screening     Status: Abnormal   Collection Time: 09/11/15  6:54 PM  Result Value Ref Range Status   MRSA by PCR POSITIVE (A) NEGATIVE Final    Comment:        The GeneXpert MRSA Assay (FDA approved for NASAL specimens only), is one component of a comprehensive MRSA colonization surveillance program. It is not intended to diagnose MRSA infection nor to guide or monitor treatment for MRSA infections. RESULT CALLED TO, READ BACK BY AND VERIFIED WITH: T.CRITE,RN 09/11/15 @2042  BY V.WILKINS   Culture, blood (Routine X 2) w Reflex to ID Panel     Status: None   Collection Time: 09/13/15  4:05 PM  Result Value Ref Range Status   Specimen Description BLOOD LEFT ANTECUBITAL  Final   Special Requests IN PEDIATRIC BOTTLE 3CC  Final   Culture NO GROWTH 5 DAYS  Final   Report Status 09/18/2015 FINAL  Final  Culture, blood (Routine X 2) w Reflex to ID Panel     Status: None   Collection Time: 09/13/15  4:15 PM  Result Value Ref Range Status   Specimen Description BLOOD LEFT ANTECUBITAL  Final   Special Requests IN PEDIATRIC BOTTLE 0.5CC  Final   Culture NO GROWTH 5 DAYS  Final   Report Status 09/18/2015 FINAL  Final     ASSESSMENT: She is improving on therapy for recurrent MRSA prosthetic tricuspid valve endocarditis. Her most recent blood cultures are negative. She is tolerating antibiotic therapy but her recent vancomycin and gentamicin trough levels have been elevated. She will need to have careful oversight of dosing of her antibiotics and monitoring of her renal function. She will need a PICC line and 6 weeks of total antibiotic therapy.  PLAN: 1. Continue current antibiotics with careful monitoring for 6 weeks through 10/22/2015` 2. Awaiting decision about  skilled nursing facility placement  Cliffton AstersJohn Aminata Buffalo, MD [facility] [phone] pager   [phone] cell 09/20/2015, 5:14 PM

## 2015-09-20 NOTE — Progress Notes (Addendum)
CRITICAL VALUE ALERT  Critical value received:  Gentamycin trough 2.2  Date of notification:  09/20/2015  Time of notification:  1420  Critical value read back: yes  Nurse who received alert:  Tobin Chadracy Theia Dezeeuw RN  MD notified (1st page):  MD Ghimire  Time of first page:  [phone]

## 2015-09-21 ENCOUNTER — Encounter (HOSPITAL_COMMUNITY): Payer: Self-pay | Admitting: Internal Medicine

## 2015-09-21 DIAGNOSIS — I959 Hypotension, unspecified: Secondary | ICD-10-CM

## 2015-09-21 DIAGNOSIS — N170 Acute kidney failure with tubular necrosis: Secondary | ICD-10-CM

## 2015-09-21 DIAGNOSIS — E860 Dehydration: Secondary | ICD-10-CM

## 2015-09-21 LAB — BASIC METABOLIC PANEL
Anion gap: 9 (ref 5–15)
BUN: 14 mg/dL (ref 6–20)
CALCIUM: 7.8 mg/dL — AB (ref 8.9–10.3)
CO2: 24 mmol/L (ref 22–32)
Chloride: 101 mmol/L (ref 101–111)
Creatinine, Ser: 1 mg/dL (ref 0.44–1.00)
GFR calc Af Amer: >60 mL/min (ref >60)
GLUCOSE: 114 mg/dL — AB (ref 65–99)
Potassium: 4.2 mmol/L (ref 3.5–5.1)
Sodium: 134 mmol/L — ABNORMAL LOW (ref 135–145)

## 2015-09-21 LAB — VANCOMYCIN, TROUGH: VANCOMYCIN TR: 37 ug/mL — AB (ref 10.0–20.0)

## 2015-09-21 MED ORDER — SODIUM CHLORIDE 0.9 % IV BOLUS (SEPSIS)
500.0000 mL | Freq: Once | INTRAVENOUS | Status: AC
  Administered 2015-09-21: 500 mL via INTRAVENOUS

## 2015-09-21 MED ORDER — SODIUM CHLORIDE 0.9% FLUSH
10.0000 mL | INTRAVENOUS | Status: DC | PRN
  Administered 2015-09-22: 10 mL
  Administered 2015-09-22: 20 mL
  Filled 2015-09-21 (×2): qty 40

## 2015-09-21 MED ORDER — SODIUM CHLORIDE 0.9 % IV BOLUS (SEPSIS)
500.0000 mL | Freq: Four times a day (QID) | INTRAVENOUS | Status: DC | PRN
  Administered 2015-09-21 – 2015-09-22 (×2): 500 mL via INTRAVENOUS
  Filled 2015-09-21 (×2): qty 500

## 2015-09-21 NOTE — Progress Notes (Signed)
Notified NP Kirby of BP 78/55 HR 120. No new orders at this time

## 2015-09-21 NOTE — Progress Notes (Signed)
Critical Vancomycin trough of 37. Pharmacy is aware and has adjusted the medication will continue to monitor.

## 2015-09-21 NOTE — Progress Notes (Signed)
PATIENT DETAILS Name: Melanie Crane Age: 40 y.o. 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 Initially hospital course was complicated by noncooperation and suicidal ideation, seen by psychiatry, after prolonged discussion and adjustment of narcotic regimen she is much improved. She is no longer suicidal. Psychiatry had placed her on IVC along with a sitter at bedside. Since, she is much more compliant and cooperative,and is no longer suicidal, psychiatry has been reconsulted on 4/10   IVC resended  and sitter discontinued.  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 Repeat blood cultures on 4/3 negative.ar. Underwent TEE on 4/7-which shows a large vegetation-subsequently seen by cardiothoracic surgery-recommendations are to continue with IV antibiotics. Placed  PICC line. Continue current antibiotics with careful monitoring for 6 weeks through 10/22/2015.skilled nursing facility placement. Afebrile despite having soft blood pressure this morning. Bolus as  Needed for symptomatic hypotension.  Acute hypoxemic respiratory failure: Likely due to septic emboli.Recent CTA Chest 4/1 negative for Pul Embolism. Chest x-ray 4/6 negative for major abnormalities except for known septic emboli/nodular lesions. Continue to provide supportive care and taper off oxygen when possible. Not sure if anxiety is playing a role.  Prior history of tricuspid valve endocarditis: 3 endocarditis  admissions in the past, continued IVDU (crushes opana)- Led to TVR in 2015.  Chronic pain syndrome: Claims to have chronic chest pain- for which she abuses opana (intravenously)-claims takes around 40 mg TID-after discussion with psychiatry-we have increased MS Contin and have increased dosage for oxycodone for breakthrough pain. Currently pain seems relatively well-controlled-with MS Contin to 75 mg twice a day.  Difficult situation, in order to ensure compliance to medication and procedure she is going to need adequate pain control, once all procedures have been completed, we will need to slowly start tapering off narcotics.   History of IVDA (opiates-crushes opana): Counseled extensively.IVC removed by psychiatry. Patient has capacity to make on medical decisions. Looking forward to residential substance abuse rehabilitation program when medically stable.  Mild thrombocytopenia: Improving. Watch closely while on Lovenox.  Anemia: Likely secondary to acute illness/endocarditis-follow periodically.  Suicidal ideation: Reported that patient made multiple statements about "wanting to die", psych was consulted who recommended IVC. IVC placed. Dr. Elsie Saas, psychiatry, consulted with patient and  IVC  removed, safety sitter discontinued  History of RUE DVT: PICC-line related during 09/2014 admission- had lupus anticoagulant positive-Dr. Fausto Skillern with hematology-Dr.Kale-and felt that patient did not require anticoagulation in this setting furthermore, patient is an active IVDA-noncompliant to antibiotics-and will be a very poor long-term anticoagulation candidate.  Chronic hepatitis C: follow up with ID clinic.   Disposition: Likely SNF in next 1-2 days   Antimicrobial agents  See below  Anti-infectives    Start     Dose/Rate Route Frequency Ordered Stop   09/20/15 2000  gentamicin (GARAMYCIN) IVPB 60 mg     60 mg 100 mL/hr over 30 Minutes Intravenous Every 12 hours 09/20/15 1710      09/19/15 1400  rifampin (RIFADIN) capsule 300 mg     300 mg  Oral 3 times per day 09/19/15 1211     09/17/15 0500  vancomycin (VANCOCIN) 1,500 mg in sodium chloride 0.9 % 500 mL IVPB  Status:  Discontinued     1,500 mg 250 mL/hr over 120 Minutes Intravenous Every 12 hours 09/16/15 1833 09/21/15 0630   09/14/15 0400  vancomycin (VANCOCIN) 1,250 mg in sodium chloride 0.9 % 250 mL IVPB  Status:  Discontinued     1,250 mg 166.7 mL/hr over 90 Minutes Intravenous Every 12 hours 09/13/15 1902 09/16/15 1833   09/13/15 1000  gentamicin (GARAMYCIN) 70 mg in dextrose 5 % 50 mL IVPB  Status:  Discontinued     70 mg 103.5 mL/hr over 30 Minutes Intravenous Every 8 hours 09/13/15 0938 09/20/15 1448   09/12/15 1415  rifampin (RIFADIN) 300 mg in sodium chloride 0.9 % 100 mL IVPB  Status:  Discontinued     300 mg 200 mL/hr over 30 Minutes Intravenous 3 times per day 09/12/15 1408 09/19/15 1210   09/12/15 1100  ceFAZolin (ANCEF) IVPB 2g/100 mL premix  Status:  Discontinued     2 g 200 mL/hr over 30 Minutes Intravenous Every 8 hours 09/12/15 0957 09/13/15 0907   09/11/15 0900  vancomycin (VANCOCIN) IVPB 1000 mg/200 mL premix  Status:  Discontinued     1,000 mg 200 mL/hr over 60 Minutes Intravenous Every 8 hours 09/11/15 0644 09/13/15 1841   09/11/15 0645  piperacillin-tazobactam (ZOSYN) IVPB 3.375 g  Status:  Discontinued     3.375 g 12.5 mL/hr over 240 Minutes Intravenous 3 times per day 09/11/15 0644 09/12/15 0946   09/11/15 0100  ceFEPIme (MAXIPIME) 2 g in dextrose 5 % 50 mL IVPB     2 g 100 mL/hr over 30 Minutes Intravenous  Once 09/11/15 0056 09/11/15 0352   09/11/15 0045  vancomycin (VANCOCIN) IVPB 1000 mg/200 mL premix     1,000 mg 200 mL/hr over 60 Minutes Intravenous  Once 09/11/15 0040 09/11/15 0207      DVT Prophylaxis: Prophylactic Lovenox   Code Status: Full code   Family Communication None at bedsdie  Procedures: None  CONSULTS:  ID, psychiatry and Palliative care  Time  spent 25 minutes-Greater than 50% of this time was spent in counseling, explanation of diagnosis, planning of further management, and coordination of care.  MEDICATIONS: Scheduled Meds: . clonazePAM  1 mg Oral BID  . enoxaparin (LOVENOX) injection  40 mg Subcutaneous Q24H  . gentamicin  60 mg Intravenous Q12H  . magnesium oxide  400 mg Oral Daily  . metoprolol tartrate  25 mg Oral BID  . morphine  75 mg Oral Q12H  . pantoprazole  40 mg Oral Q1200  . polyethylene glycol  17 g Oral Daily  . rifampin  300 mg Oral 3 times per day  . senna  1 tablet Oral QHS  . sodium chloride flush  3 mL Intravenous Q12H   Continuous Infusions:  PRN Meds:.acetaminophen **OR** [DISCONTINUED] acetaminophen, bisacodyl, diphenoxylate-atropine, gi cocktail, methocarbamol, naLOXone (NARCAN)  injection, oxyCODONE, sodium chloride flush, sodium phosphate    PHYSICAL EXAM: Vital signs in last 24 hours: Filed Vitals:   09/20/15 2101 09/21/15 0602 09/21/15 0659 09/21/15 1100  BP: 99/61 78/55 83/73  93/53  Pulse: 71 120  101  Temp: 98.6 F (37 C) 99.6 F (37.6 C)  98.9 F (37.2 C)  TempSrc: Oral Oral    Resp: 20 18  20   Height:      Weight:  67.813 kg (149 lb 8 oz)  SpO2: 96% 91%  95%    Weight change: 1.413 kg (3 lb 1.8 oz) Filed Weights   09/19/15 0502 09/20/15 0500 09/21/15 0602  Weight: 67.2 kg (148 lb 2.4 oz) 66.4 kg (146 lb 6.2 oz) 67.813 kg (149 lb 8 oz)   Body mass index is 25.65 kg/(m^2).   Gen Exam: Awake and alert with clear speech.  Lying comfortably in bed without any distress Neck: Supple, No JVD.   Chest: B/L Clear.   CVS: S1 S2 Regular, no murmurs.  Abdomen: soft, BS +, non tender, non distended.  Extremities: no edema, lower extremities warm to touch. Neurologic: Non Focal.  Skin: No Rash.   Wounds: N/A.    Intake/Output from previous day:  Intake/Output Summary (Last 24 hours) at 09/21/15 1431 Last data filed at 09/21/15 0955  Gross per 24 hour  Intake    610 ml    Output      2 ml  Net    608 ml     LAB RESULTS: CBC  Recent Labs Lab 09/15/15 0530 09/16/15 0529 09/17/15 0548 09/18/15 0737  WBC 8.8 10.4 16.1* 12.1*  HGB 9.8* 10.4* 11.4* 9.6*  HCT 30.5* 33.3* 36.0 31.5*  PLT 141* 135* 165 128*  MCV 75.7* 76.7* 77.3* 77.2*  MCH 24.3* 24.0* 24.5* 23.5*  MCHC 32.1 31.2 31.7 30.5  RDW 17.8* 19.0* 19.2* 18.6*  LYMPHSABS 1.9  --   --   --   MONOABS 0.8  --   --   --   EOSABS 0.0  --   --   --   BASOSABS 0.0  --   --   --     Chemistries   Recent Labs Lab 09/15/15 0530 09/16/15 0529 09/19/15 0753 09/20/15 0903 09/21/15 1135  NA 138 137 135 134* 134*  K 5.2* 4.8 4.5 4.7 4.2  CL 106 102 104 102 101  CO2 20* 23 17* 21* 24  GLUCOSE 91 92 135* 112* 114*  BUN 7 7 11 14 14   CREATININE 0.71 0.82 0.93 0.99 1.00  CALCIUM 8.0* 8.1* 7.9* 8.1* 7.8*    CBG: No results for input(s): GLUCAP in the last 168 hours.  GFR Estimated Creatinine Clearance: 71.4 mL/min (by C-G formula based on Cr of 1).  Coagulation profile  Recent Labs Lab 09/15/15 0530  INR 1.35    Cardiac Enzymes No results for input(s): CKMB, TROPONINI, MYOGLOBIN in the last 168 hours.  Invalid input(s): CK  Invalid input(s): POCBNP No results for input(s): DDIMER in the last 72 hours. No results for input(s): HGBA1C in the last 72 hours. No results for input(s): CHOL, HDL, LDLCALC, TRIG, CHOLHDL, LDLDIRECT in the last 72 hours. No results for input(s): TSH, T4TOTAL, T3FREE, THYROIDAB in the last 72 hours.  Invalid input(s): FREET3 No results for input(s): VITAMINB12, FOLATE, FERRITIN, TIBC, IRON, RETICCTPCT in the last 72 hours. No results for input(s): LIPASE, AMYLASE in the last 72 hours.  Urine Studies No results for input(s): UHGB, CRYS in the last 72 hours.  Invalid input(s): UACOL, UAPR, USPG, UPH, UTP, UGL, UKET, UBIL, UNIT, UROB, ULEU, UEPI, UWBC, URBC, UBAC, CAST, UCOM, BILUA  MICROBIOLOGY: Recent Results (from the past 240 hour(s))  MRSA PCR  Screening     Status: Abnormal   Collection Time: 09/11/15  6:54 PM  Result Value Ref Range Status   MRSA by PCR POSITIVE (A) NEGATIVE Final    Comment:        The GeneXpert MRSA Assay (FDA approved for  NASAL specimens only), is one component of a comprehensive MRSA colonization surveillance program. It is not intended to diagnose MRSA infection nor to guide or monitor treatment for MRSA infections. RESULT CALLED TO, READ BACK BY AND VERIFIED WITH: T.CRITE,RN 09/11/15 @2042  BY V.WILKINS   Culture, blood (Routine X 2) w Reflex to ID Panel     Status: None   Collection Time: 09/13/15  4:05 PM  Result Value Ref Range Status   Specimen Description BLOOD LEFT ANTECUBITAL  Final   Special Requests IN PEDIATRIC BOTTLE 3CC  Final   Culture NO GROWTH 5 DAYS  Final   Report Status 09/18/2015 FINAL  Final  Culture, blood (Routine X 2) w Reflex to ID Panel     Status: None   Collection Time: 09/13/15  4:15 PM  Result Value Ref Range Status   Specimen Description BLOOD LEFT ANTECUBITAL  Final   Special Requests IN PEDIATRIC BOTTLE 0.5CC  Final   Culture NO GROWTH 5 DAYS  Final   Report Status 09/18/2015 FINAL  Final    RADIOLOGY STUDIES/RESULTS: Dg Chest 2 View  09/12/2015  CLINICAL DATA:  Respiratory failure. EXAM: CHEST  2 VIEW COMPARISON:  September 11, 2015. FINDINGS: Stable cardiomegaly. No pneumothorax is noted. Mild bibasilar opacities are noted concerning for subsegmental atelectasis or possibly inflammation. Bony thorax is unremarkable. IMPRESSION: Mild bibasilar opacities concerning for subsegmental atelectasis or possibly inflammation. Electronically Signed   By: Lupita Raider, M.D.   On: 09/12/2015 09:11   Dg Chest 2 View  09/10/2015  CLINICAL DATA:  Acute onset of generalized chest pain and shortness of breath. Drowsiness. Initial encounter. EXAM: CHEST  2 VIEW COMPARISON:  CTA of the chest performed 04/10/2015 FINDINGS: Right midlung density, measuring 3.1 cm, demonstrates  increased density compared to prior studies. This may reflect residual or recurrent cavitating pneumonia or abscess, though a mass cannot be excluded. No pleural effusion or pneumothorax is seen. The heart is normal in size. No acute osseous abnormalities are identified. IMPRESSION: Right midlung density, measuring 3.1 cm, demonstrates increased density compared to prior studies. This may reflect residual or recurrent cavitating pneumonia or abscess, though a mass cannot be excluded. CT of the chest could be considered for further evaluation, when and as deemed clinically appropriate. Electronically Signed   By: Roanna Raider M.D.   On: 09/10/2015 23:23   Ct Angio Chest Pe W/cm &/or Wo Cm  09/11/2015  CLINICAL DATA:  Chronic nonradiating chest pain, fever, shortness of breath, cough and mild abdominal pain. Initial encounter. EXAM: CT ANGIOGRAPHY CHEST WITH CONTRAST TECHNIQUE: Multidetector CT imaging of the chest was performed using the standard protocol during bolus administration of intravenous contrast. Multiplanar CT image reconstructions and MIPs were obtained to evaluate the vascular anatomy. CONTRAST:  50mL OMNIPAQUE IOHEXOL 350 MG/ML SOLN COMPARISON:  Chest radiograph performed 09/10/2015, and CTA of the chest performed 04/10/2015 FINDINGS: There is no evidence of significant pulmonary embolus. Multiple nodular opacities are noted scattered throughout the lungs, many of which demonstrate central cavitation, most compatible with diffuse bilateral septic emboli. Underlying trace right-sided pleural fluid is noted. No pneumothorax is seen. Scattered coronary artery calcifications are seen. Prominent subcarinal and right hilar nodes are seen, measuring 1.6 cm and 1.2 cm in short axis. No pericardial effusion is identified. The great vessels are grossly unremarkable in appearance. Postoperative change is noted about the right atrium and right ventricle. No axillary lymphadenopathy is seen. The visualized  portions of the thyroid gland are unremarkable in appearance. The  visualized portions of the liver are unremarkable. The spleen is somewhat bulky, but only borderline prominent in length. Trace ascites is suggested noted tracking about the liver and spleen. No acute osseous abnormalities are seen. Review of the MIP images confirms the above findings. IMPRESSION: 1. No evidence of significant pulmonary embolus. 2. Multiple nodular opacities scattered throughout the lungs, many of which demonstrate central cavitation, most compatible with diffuse bilateral septic emboli, given the patient's history. 3. Underlying trace right-sided pleural fluid noted. 4. Prominent mediastinal and right hilar nodes noted, likely reflecting the underlying infection. 5. Scattered coronary artery calcifications seen. 6. Suggestion of trace ascites at the upper abdomen. These results were called by telephone at the time of interpretation on 09/11/2015 at 2:49 am to Dr. Zadie Rhine, who verbally acknowledged these results. Electronically Signed   By: Roanna Raider M.D.   On: 09/11/2015 02:49   Dg Chest Port 1 View  09/16/2015  CLINICAL DATA:  Shortness of breath. EXAM: PORTABLE CHEST 1 VIEW COMPARISON:  09/12/2015.  CT 09/11/2015. FINDINGS: Heart size normal. Persistent nodular densities/infiltrates are again noted throughout both lungs. Cavitary nodular densities are noted on prior recent CT of 09/11/2015. No pleural effusion pneumothorax. Stable cardiomegaly. No acute bony abnormality. IMPRESSION: Persistent nodular opacities noted throughout both lungs. Similar findings noted on prior exams. Cavitary nodular infiltrates were noted on prior CT of 09/11/2015. Electronically Signed   By: Maisie Fus  Register   On: 09/16/2015 08:12   Dg Chest Portable 1 View  09/11/2015  CLINICAL DATA:  Sepsis, endocarditis, fever EXAM: PORTABLE CHEST 1 VIEW COMPARISON:  Portable exam 1548 hours compared to 09/10/2015 and correlated with chest CT of  09/11/2015 FINDINGS: Normal size of cardiac silhouette post TVR. Mediastinal contours and pulmonary vascularity normal. Lungs clear. No pleural effusion or pneumothorax. Bones unremarkable. IMPRESSION: No acute abnormalities. Electronically Signed   By: Ulyses Southward M.D.   On: 09/11/2015 15:58    Richarda Overlie, MD  [facility] Pager: [phone]  If 7PM-7AM, please contact night-coverage [email or web address] Password [email or web address] 09/21/2015, 2:31 PM   LOS: 10 days

## 2015-09-21 NOTE — Progress Notes (Signed)
Peripherally Inserted Central Catheter/Midline Placement  The IV Nurse has discussed with the patient and/or persons authorized to consent for the patient, the purpose of this procedure and the potential benefits and risks involved with this procedure.  The benefits include less needle sticks, lab draws from the catheter and patient may be discharged home with the catheter.  Risks include, but not limited to, infection, bleeding, blood clot (thrombus formation), and puncture of an artery; nerve damage and irregular heat beat.  Alternatives to this procedure were also discussed.  PICC/Midline Placement Documentation        Lisabeth DevoidGibbs, Kandyce Dieguez Jeanette 09/21/2015, 9:26 AM

## 2015-09-21 NOTE — Progress Notes (Signed)
Pt blood pressure after 500 cc bolus is 83/73. Dr.Abrol made aware

## 2015-09-21 NOTE — Progress Notes (Signed)
Text paged Dr Susie CassetteAbrol. Requested sitter to be DC'd, if she feels appropriate, so patient can have 24 hours off prior to dc to SNF.

## 2015-09-21 NOTE — Progress Notes (Signed)
Patient ID: Melanie Crane, female   DOB: 09/30/1975, 40 y.o.   MRN: [id number]         [facility]    Date of Admission:  09/10/2015   Total days of antibiotics 11         Melanie Crane is improving slowly on therapy for recurrent MRSA tricuspid valve endocarditis. Of concern is the fact that her vancomycin and gentamicin trough levels are both elevated, she has been relatively hypotensive today and her creatinine is slowly increasing but still normal. She is at relatively high risk for nephrotoxicity. I will continue her current 3 drug antibiotic regimen and monitor trough levels and renal function closely.         Cliffton AstersJohn Ayo Smoak, MD [facility] [phone] pager   [phone] cell 06/15/2015, 1:32 PM

## 2015-09-21 NOTE — Clinical Social Work Note (Signed)
Clinical Social Work Assessment  Patient Details  Name: Melanie RavelingJoann Theresa Oneil MRN: [id number] Date of Birth: 03/15/1976  Date of referral:  09/21/15               Reason for consult:  Facility Placement                Permission sought to share information with:  Case Manager, Magazine features editoracility Contact Representative Permission granted to share information::  Yes, Release of Information Signed  Name::      Fulton Reek(Marylyn Dunn)  Agency::     Relationship::   (mother)  Contact Information:   [phone])  Housing/Transportation Living arrangements for the past 2 months:   (friends house) Source of Information:  Patient Patient Interpreter Needed:  None Criminal Activity/Legal Involvement Pertinent to Current Situation/Hospitalization:  No - Comment as needed Significant Relationships:   (children and mom) Lives with:   (with friends) Do you feel safe going back to the place where you live?  Yes Need for family participation in patient care:  No (Coment)  Care giving concerns:  PT recommend SNF. Long term IV antibiotics    Social Worker assessment / plan: BSW intern enters room. Patient was alert and oriented with sitter near bedside.  BSW intern explained PT recommendation and referral process. BSW intern notified patient [place] Patient freaked when she found out in regards to her not being able to see her children and family. Patient is not happy about that but is agreeable to SNF.  Employment status:  Disabled (Comment on whether or not currently receiving Disability) Insurance information:   (no insurance) PT Recommendations:  Skilled Nursing Facility Information / Referral to community resources:  SNF  Patient/Family's Response to care:  Patient does not have a good response to care. Patient is upset at prospect of being placed far away from family.   Patient/Family's Understanding of and Emotional Response to Diagnosis,  Current Treatment, and Prognosis:  Patient does understand current condition and treatment plans but is very emotional at idea of being placed far from her children  Emotional Assessment Appearance:  Appears stated age Attitude/Demeanor/Rapport:   (welcoming) Affect (typically observed):   (upset, crying,) Orientation:  Oriented to Self, Oriented to Place, Oriented to  Time, Oriented to Situation Alcohol / Substance use:  Tobacco Use, Illicit Drugs (Current Every Day Smoker; IV, Opium; (injectable opana in L forarm for 2-3 month)) Psych involvement (Current and /or in the community):  Yes- inpatient eval for suicidal ideation, was under IVC- now cleared by psych  Discharge Needs  Concerns to be addressed:  No discharge needs identified Readmission within the last 30 days:  No Current discharge risk:  None Barriers to Discharge:  Leanne ChangSitter, LOG bed  Catheryn Baconharlean McNeil  BSW intern  [phone]   CSW reviewed assessment and made appropriate corrections- now agree with its findings  Merlyn LotJenna Holoman, [facility] Clinical Social Worker [phone]

## 2015-09-21 NOTE — Consult Note (Signed)
[facility] Face-to-Face Psychiatry Consult Follow up  Reason for Consult:  Opioid IVDA Referring Physician:  Dr. Sloan Leiter Patient Identification: Melanie Crane MRN:  [id number] Principal Diagnosis: Acute bacterial endocarditis Diagnosis:   Patient Active Problem List   Diagnosis Date Noted  . Dehydration [E86.0]   . MRSA bacteremia [R78.81, B95.62]   . Respiratory failure ([place]) [J96.90]   . Staphylococcus aureus bacteremia with sepsis ([place]) [A41.01]   . Acute renal failure ([place]) [N17.9]   . Narcotic abuse [F11.10]   . Prolonged Q-T interval on ECG [I45.81] 09/11/2015  . Normocytic anemia [D64.9]   . Narcotic dependence ([place]) [F11.20]   . Palliative care encounter [Z51.5]   . Pain in the chest [R07.9]   . Other iron deficiency anemias [D50.8]   . Acute septic pulmonary embolism without acute cor pulmonale (HCC) [I26.90]   . Sepsis ([place]) [A41.9]   . 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 Embolism, pulmonary with infarction ([place]) [I26.99] 04/11/2015  . Abscess of right lung with pneumonia ([place]) [J85.1]   . Pulmonary embolism ([place]) [I26.99] 04/10/2015  . Septic embolism ([place]) [I26.90] 04/10/2015  . Lung abscess ([place]) [J85.2] 04/10/2015  . PE (pulmonary embolism) [I26.99] 04/10/2015  . Hepatitis C [B19.20] 12/30/2013  . Hypokalemia [E87.6] 12/30/2013  . UTI (urinary tract infection) [N39.0] 12/30/2013  . Acute bacterial endocarditis - right sided [I33.0] 12/29/2013  . IV drug abuse [F19.10] 12/29/2013  . Anemia [D64.9] 12/29/2013  . Thrombocytopenia, unspecified ([place]) [D69.6] 12/29/2013  . Cigarette smoker [Z72.0] 12/29/2013  . Shock ([place]) [R57.9] 12/28/2013  . Secondary amenorrhea [N91.1] 12/28/2013    Total Time spent with patient: 30 minutes  Subjective:   [name] is a 40 y.o. female patient admitted with chest pain and shortness of breath.  HPI:  Melanie Crane is a 40 years old married female admitted to [facility] with the shortness of  breath and chest pain. Patient has been suffering with opioid dependence and had 3 previous episodes of endocarditis that required open heart surgery in [place] about a year and half ago. Patient reportedly relapsed on drug of abuse about 2 months ago after being sober one and half years. Patient reported she came to [place] and then she relapsed with Opana a intravenous drug use. Patient reportedly abusing marijuana and opioids since age 52 years old. Patient reported her sister died secondary to Opana dependence and related infection in January 2015. Patient reportedly abusing same medication since her sister died. Patient denied drug abuse in biological parents and 3 brothers who live in different states. Patient has a 3 children ages 37, 36 and 61 who has been under care of paternal grandparents. Patient also suffering with hepatitis C secondary to IV drug use. Patient has intact cognitions including orientation, concentration, memory and language functions. Patient denies symptoms of depression, anxiety, bipolar mania, auditory/visual hallucinations, delusions and paranoia. Patient denied suicidal or homicidal ideation. Patient is willing to receive substance abuse rehabilitation and also medication if needed. Past Psychiatric History: Patient denied history of substance abuse rehabilitation and mental health treatment.    Interval history: Patient appeared calm and cooperative during this evaluation. Patient stated that she is satisfied with the current medication management for her pain control. She is able to understand staff and physician's concern now and in agreement with proposed treatment including PICC line placement, extended IV antibiotics treatment and possible SNF for treatment. Patient mother has been able to speak with patient recently about the same. Patient and her  mother aware of the recommended treatment and she was previously went through the same treatment for the similar  condition. Case discussed with Dr. Sloan Leiter and unit case manager.  Risk to Self: Is patient at risk for suicide?: No Risk to Others:   Prior Inpatient Therapy:   Prior Outpatient Therapy:    Past Medical History:  Past Medical History  Diagnosis Date  . Anemia   . IVDU (intravenous drug user)   . 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 Hepatitis C   . Miscarriage     Five times  . ARF (acute renal failure) ([place]) 4//2017    Past Surgical History  Procedure Laterality Date  . Bunionectomy    . Tonsillectomy    . 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 Tee without cardioversion N/A 04/14/2015    Procedure: TRANSESOPHAGEAL ECHOCARDIOGRAM (TEE);  Surgeon: Lelon Perla, MD;  Location: [facility] ENDOSCOPY;  Service: Cardiovascular;  Laterality: N/A;  . Tee without cardioversion N/A 09/17/2015    Procedure: TRANSESOPHAGEAL ECHOCARDIOGRAM (TEE);  Surgeon: Fay Records, MD;  Location: [facility] ENDOSCOPY;  Service: Cardiovascular;  Laterality: N/A;   Family History:  Family History  Problem Relation Age of Onset  . Hypertension Father   . Kidney failure Father   . Hyperlipidemia Mother   . CAD Mother   . Deafness Mother    Family Psychiatric  History: Patient's sister suffered with a drug of abuse and died secondary to drug related infection. Social History:  History  Alcohol Use No     History  Drug Use  . Yes  . Special: IV, Opium    Comment: injectable opana in L forarm for 2-3 month    Social History   Social History  . Marital Status: Divorced    Spouse Name: N/A  . Number of Children: N/A  . Years of Education: N/A   Occupational History  . Unemployed    Social History Main Topics  . Smoking status: Current Every Day Smoker -- 1.00 packs/day for 10 years    Types: Cigarettes  . Smokeless tobacco: Never Used     Comment: 1 1/2 ppd  . Alcohol Use: No  . Drug Use: Yes    Special:  IV, Opium     Comment: injectable opana in L forarm for 2-3 month  . Sexual Activity: Not Asked   Other Topics Concern  . None   Social History Narrative   Was living in [place], [place]. In [place] visiting family.   Additional Social History:    Allergies:  No Known Allergies  Labs:  Results for orders placed or performed during the hospital encounter of 09/10/15 (from the past 48 hour(s))  Gentamicin level, trough     Status: Abnormal   Collection Time: 09/20/15  9:03 AM  Result Value Ref Range   Gentamicin Trough 2.2 (HH) 0.5 - 2.0 ug/mL    Comment: CRITICAL RESULT CALLED TO, READ BACK BY AND VERIFIED WITH: NOTIFIED CLARK,S MT @1417  ON [id number] BY F[place] CRITICAL RESULT CALLED TO, READ BACK BY AND VERIFIED WITH: T.DUONG,RN 1420 09/20/15 CLARK,S   Basic metabolic panel     Status: Abnormal   Collection Time: 09/20/15  9:03 AM  Result Value Ref Range   Sodium 134 (L) 135 - 145 mmol/L   Potassium 4.7 3.5 - 5.1 mmol/L   Chloride 102 101 - 111 mmol/L  CO2 21 (L) 22 - 32 mmol/L   Glucose, Bld 112 (H) 65 - 99 mg/dL   BUN 14 6 - 20 mg/dL   Creatinine, Ser 0.99 0.44 - 1.00 mg/dL   Calcium 8.1 (L) 8.9 - 10.3 mg/dL   GFR calc non Af Amer >60 >60 mL/min   GFR calc Af Amer >60 >60 mL/min    Comment: (NOTE) The eGFR has been calculated using the CKD EPI equation. This calculation has not been validated in all clinical situations. eGFR's persistently <60 mL/min signify possible Chronic Kidney Disease.    Anion gap 11 5 - 15  Vancomycin, trough     Status: Abnormal   Collection Time: 09/21/15  4:32 AM  Result Value Ref Range   Vancomycin Tr 37 (HH) 10.0 - 20.0 ug/mL    Comment: CRITICAL RESULT CALLED TO, READ BACK BY AND VERIFIED WITH: MCKINNEY,S RN 09/21/2015 0618 JORDANS     Current Facility-Administered Medications  Medication Dose Route Frequency Provider Last Rate Last Dose  . acetaminophen (TYLENOL) tablet 650 mg  650 mg Oral Q6H PRN Vianne Bulls, MD      .  bisacodyl (DULCOLAX) EC tablet 5 mg  5 mg Oral Daily PRN Vianne Bulls, MD      . clonazePAM Bobbye Charleston) tablet 1 mg  1 mg Oral BID Jonetta Osgood, MD   1 mg at 09/20/15 2243  . diphenoxylate-atropine (LOMOTIL) 2.5-0.025 MG per tablet 1 tablet  1 tablet Oral QID PRN Nita Sells, MD      . enoxaparin (LOVENOX) injection 40 mg  40 mg Subcutaneous Q24H Jonetta Osgood, MD   40 mg at 09/20/15 2242  . gentamicin (GARAMYCIN) IVPB 60 mg  60 mg Intravenous Q12H Cassie Jodean Lima, RPH   60 mg at 09/21/15 0809  . gi cocktail (Maalox,Lidocaine,Donnatal)  30 mL Oral TID PRN Jonetta Osgood, MD   30 mL at 09/15/15 1739  . magnesium oxide (MAG-OX) tablet 400 mg  400 mg Oral Daily Nita Sells, MD   400 mg at 09/20/15 0930  . methocarbamol (ROBAXIN) tablet 500 mg  500 mg Oral Q6H PRN Nita Sells, MD   500 mg at 09/17/15 1751  . metoprolol tartrate (LOPRESSOR) tablet 25 mg  25 mg Oral BID Jonetta Osgood, MD   25 mg at 09/20/15 0933  . morphine (MS CONTIN) 12 hr tablet 75 mg  75 mg Oral Q12H Jonetta Osgood, MD   75 mg at 09/20/15 2243  . naloxone [facility]) injection 0.4 mg  0.4 mg Intravenous PRN Jonetta Osgood, MD      . oxyCODONE (Oxy IR/ROXICODONE) immediate release tablet 15-20 mg  15-20 mg Oral Q4H PRN Jonetta Osgood, MD   20 mg at 09/21/15 0814  . pantoprazole (PROTONIX) EC tablet 40 mg  40 mg Oral Q1200 Jonetta Osgood, MD   40 mg at 09/20/15 1254  . polyethylene glycol (MIRALAX / GLYCOLAX) packet 17 g  17 g Oral Daily Jonetta Osgood, MD   17 g at 09/15/15 1700  . rifampin (RIFADIN) capsule 300 mg  300 mg Oral 3 times per day Jonetta Osgood, MD   300 mg at 09/21/15 0600  . senna (SENOKOT) tablet 8.6 mg  1 tablet Oral QHS Jonetta Osgood, MD   8.6 mg at 09/20/15 2243  . sodium chloride flush (NS) 0.9 % injection 10-40 mL  10-40 mL Intracatheter PRN Reyne Dumas, MD      . sodium chloride  flush (NS) 0.9 % injection 3 mL  3 mL Intravenous Q12H Ilene Qua Opyd, MD    3 mL at 09/17/15 1000  . sodium phosphate (FLEET) 7-19 GM/118ML enema 1 enema  1 enema Rectal Daily PRN Jonetta Osgood, MD        Musculoskeletal: Strength & Muscle Tone: within normal limits Gait & Station: unable to stand Patient leans: N/A  Psychiatric Specialty Exam: ROS Follow-up   Blood pressure 83/73, pulse 120, temperature 99.6 F (37.6 C), temperature source Oral, resp. rate 18, height 5' 4"  (1.626 m), weight 67.813 kg (149 lb 8 oz), last menstrual period 04/12/2015, SpO2 91 %.Body mass index is 25.65 kg/(m^2).  General Appearance: Casual  Eye Contact::  Good  Speech:  Clear and Coherent  Volume:  Decreased  Mood:  Depressed  Affect:  Constricted and Depressed  Thought Process:  Coherent and Goal Directed  Orientation:  Full (Time, Place, and Person)  Thought Content:  WDL  Suicidal Thoughts:  No  Homicidal Thoughts:  No  Memory:  Immediate;   Good Recent;   Fair Remote;   Fair  Judgement:  Fair  Insight:  Fair  Psychomotor Activity:  Decreased  Concentration:  Fair  Recall:  [employer] of Knowledge:Good  Language: Good  Akathisia:  Negative  Handed:  Right  AIMS (if indicated):     Assets:  Communication Skills Desire for Improvement Financial Resources/Insurance Housing Intimacy Leisure Time Resilience Social Support Transportation  ADL's:  Impaired  Cognition: WNL  Sleep:      Treatment Plan Summary:  Rescind involuntary commitment for medical treatment as IVC petition expires as of today  Patient is in agreement with the ongoing need of IV antibiotic treatment as recommended for bacterial endocarditis.  Patient met capacity to make own medical treatment and has fair understanding about medical condition  She will be in compliant with propose treatment, procedure and disposition plans  Patient will be referred to the residential substance abuse rehabilitation program when medically stable   Appreciate psychiatric consultation and we sign  off as of today  Please contact [phone] or [phone] if needs further assistance   Disposition: Spoke with case manager on the floor regarding the changes in her capacity and rescind IVC and need of SNF placement  Supportive therapy provided about ongoing stressors.  Durward Parcel., MD 09/21/2015 9:40 AM

## 2015-09-21 NOTE — Progress Notes (Addendum)
Pharmacy Antibiotic Note  Melanie CivatteJoann Verdie Shireheresa Crane is a 40 y.o. 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  Pharmacy has been consulted for vancomycin and gentamicin dosing. Her vancomycin trough is supratherapeutic (37 mcg/ml) on 1500mg  IV q12h. Trough drawn appropriately. RN had hung dose prior to trough resulting but only about 10 min worth (1/6) of dose infused.  Plan: - Hold vancomycin - Will check random vancomycin level in 24 hours and use to help with further dosing - Continue gentamicin 60mg  IV q12h - Will f/u renal function, vanc and gent trough at new Css, and pt's clinical condition  Height: 5\' 4"  (162.6 cm) Weight: 149 lb 8 oz (67.813 kg) IBW/kg (Calculated) : 54.7  Temp (24hrs), Avg:98.9 F (37.2 C), Min:98.4 F (36.9 C), Max:99.6 F (37.6 C)   Recent Labs Lab 09/14/15 1009 09/15/15 0530 09/16/15 0529 09/16/15 1542 09/17/15 0548 09/18/15 0737 09/19/15 0753 09/20/15 0903 09/21/15 0432  WBC 7.7 8.8 10.4  --  16.1* 12.1*  --   --   --   CREATININE 0.90 0.71 0.82  --   --   --  0.93 0.99  --   VANCOTROUGH  --   --   --  15  --   --   --   --  37*  GENTTROUGH 0.7  --   --   --   --   --   --  2.2*  --     Estimated Creatinine Clearance: 72.1 mL/min (by C-G formula based on Cr of 0.99).    No Known Allergies  Antimicrobials this admission: Gentamicin 4/3 >>  Vancomycin 4/2 >>  Rifampin 4/2 >> Zosyn 4/1 >> 4/2 Cefepime 4/1 x 1  Dose adjustments this admission: 4/3 VT 29 on 1 gm IV q8h --> changed to 1,250 mg IV q12h 4/6 VT 15 on 1,250 mg IV q12h --> changed to 1,500 mg IV q12h 4/11 VT 37 on 1500mg  IV q12h 0>>hold dose for now  4/4 Gent trough 0.7 on 70 mg IV q8h 4/10 Gent trough 2.2 --> change to 60 mg IV q12h  Microbiology results: 4/1 Blood - MRSA 4/1 MRSA PCR - POS 4/3 Blood x2 NG final 3/31 blood x2 NEG  Thank you for allowing pharmacy to be a part of this patient's  care.  Christoper Fabianaron Oluwatomisin Deman, PharmD, BCPS Clinical pharmacist, pager [phone] 09/21/2015 6:23 AM

## 2015-09-22 LAB — CBC
HEMATOCRIT: 28.3 % — AB (ref 36.0–46.0)
HEMOGLOBIN: 8.6 g/dL — AB (ref 12.0–15.0)
MCH: 23.3 pg — AB (ref 26.0–34.0)
MCHC: 30.4 g/dL (ref 30.0–36.0)
MCV: 76.7 fL — AB (ref 78.0–100.0)
PLATELETS: 175 10*3/uL (ref 150–400)
RBC: 3.69 MIL/uL — AB (ref 3.87–5.11)
RDW: 17.9 % — ABNORMAL HIGH (ref 11.5–15.5)
WBC: 9.3 10*3/uL (ref 4.0–10.5)

## 2015-09-22 LAB — COMPREHENSIVE METABOLIC PANEL
ALK PHOS: 87 U/L (ref 38–126)
ALT: 12 U/L — ABNORMAL LOW (ref 14–54)
ANION GAP: 8 (ref 5–15)
AST: 16 U/L (ref 15–41)
Albumin: 1.9 g/dL — ABNORMAL LOW (ref 3.5–5.0)
BUN: 16 mg/dL (ref 6–20)
CALCIUM: 8.1 mg/dL — AB (ref 8.9–10.3)
CHLORIDE: 103 mmol/L (ref 101–111)
CO2: 23 mmol/L (ref 22–32)
CREATININE: 0.94 mg/dL (ref 0.44–1.00)
Glucose, Bld: 104 mg/dL — ABNORMAL HIGH (ref 65–99)
Potassium: 4.4 mmol/L (ref 3.5–5.1)
SODIUM: 134 mmol/L — AB (ref 135–145)
Total Bilirubin: 0.7 mg/dL (ref 0.3–1.2)
Total Protein: 6.7 g/dL (ref 6.5–8.1)

## 2015-09-22 LAB — GENTAMICIN LEVEL, PEAK: Gentamicin Pk: 3.8 ug/mL — ABNORMAL LOW (ref 5.0–10.0)

## 2015-09-22 LAB — GENTAMICIN LEVEL, TROUGH: GENTAMICIN TR: 1 ug/mL (ref 0.5–2.0)

## 2015-09-22 LAB — VANCOMYCIN, RANDOM: Vancomycin Rm: 10 ug/mL

## 2015-09-22 MED ORDER — METOPROLOL TARTRATE 25 MG PO TABS: 12.5000 mg | ORAL_TABLET | Freq: Two times a day (BID) | ORAL | Status: AC

## 2015-09-22 MED ORDER — MORPHINE SULFATE ER 15 MG PO TBCR: 75.0000 mg | EXTENDED_RELEASE_TABLET | Freq: Two times a day (BID) | ORAL | Status: AC

## 2015-09-22 MED ORDER — OXYCODONE HCL 15 MG PO TABS: 15.0000 mg | ORAL_TABLET | ORAL | Status: AC | PRN

## 2015-09-22 MED ORDER — GENTAMICIN IN SALINE 1.2-0.9 MG/ML-% IV SOLN
60.0000 mg | INTRAVENOUS | Status: DC
Start: 2015-09-23 — End: 2015-09-22

## 2015-09-22 MED ORDER — RIFAMPIN 300 MG PO CAPS: 300.0000 mg | ORAL_CAPSULE | Freq: Three times a day (TID) | ORAL | Status: AC

## 2015-09-22 MED ORDER — METOPROLOL TARTRATE 12.5 MG HALF TABLET
12.5000 mg | ORAL_TABLET | Freq: Two times a day (BID) | ORAL | Status: DC
  Administered 2015-09-22: 12.5 mg via ORAL
  Filled 2015-09-22: qty 1

## 2015-09-22 MED ORDER — HEPARIN SOD (PORK) LOCK FLUSH 100 UNIT/ML IV SOLN
250.0000 [IU] | INTRAVENOUS | Status: AC | PRN
  Administered 2015-09-22: 250 [IU]

## 2015-09-22 MED ORDER — GENTAMICIN IN SALINE 1.2-0.9 MG/ML-% IV SOLN: 60.0000 mg | INTRAVENOUS | Status: AC

## 2015-09-22 MED ORDER — CLONAZEPAM 1 MG PO TABS: 1.0000 mg | ORAL_TABLET | Freq: Two times a day (BID) | ORAL | Status: AC

## 2015-09-22 MED ORDER — VANCOMYCIN HCL IN DEXTROSE 1-5 GM/200ML-% IV SOLN
1000.0000 mg | Freq: Two times a day (BID) | INTRAVENOUS | Status: DC
  Administered 2015-09-22: 1000 mg via INTRAVENOUS
  Filled 2015-09-22 (×2): qty 200

## 2015-09-22 MED ORDER — VANCOMYCIN HCL IN DEXTROSE 1-5 GM/200ML-% IV SOLN: 1000.0000 mg | Freq: Two times a day (BID) | INTRAVENOUS | Status: AC

## 2015-09-22 MED ORDER — SENNA 8.6 MG PO TABS: 1.0000 | ORAL_TABLET | Freq: Every day | ORAL | Status: AC

## 2015-09-22 MED ORDER — METHOCARBAMOL 500 MG PO TABS: 500.0000 mg | ORAL_TABLET | Freq: Four times a day (QID) | ORAL | Status: AC | PRN

## 2015-09-22 NOTE — Progress Notes (Signed)
Pharmacy Antibiotic Note  Melanie Crane is a 40 y.o. female admitted on 09/10/2015 with recurrent prosthetic tricuspid valve endocarditis.  Pharmacy has been consulted for vancomycin and gentamicin dosing.   She had another vancomycin level drawn this morning roughly 24 hours after last level (last level was 37) - level this AM is 10.  Using patient specific kinetics, she has a ke of ~0.056, a Vd of 49 L, and a 1/2 life of ~12.5 hours. We will restart her on 1,000 mg IV q12h for now - this will hopefully give [place] a peak of ~37 and a trough of ~19 (goal 15-20).  She will be discharged to a SNF today with recommendations to check a trough tomorrow before her evening dose.  She also had another gentamicin trough drawn this morning on her new dose of 60 mg IV q12h and it resulted as 1 (goal <0.5 ideally). Also using patient specific kinetics, her gentamicin ke is 0.122, Vd is 18.3L, and 1/2 life of ~5.7 hours. We will restart her on 60 mg IV q24 for discharge - hopefully this will give a peak of ~3.5 (goal 3-4), and a trough of ~0.2 (goal <0.5).  She only needs 4 more days of gentamicin so no more monitoring of gentamicin troughs are recommended. Her creatinine is stable.   Plan: - Restart vancomycin at 1,000 mg IV q12h - Recommend obtaining another VT tomorrow 4/13 before her 2130 dose - Restart gentamicin at 60 mg IV q24h  - Gentamicin stop date of 09/26/14 - Vancomycin and rifampin stop date of 10/22/15 - Twice weekly SCr while on vancomycin  Height:  (162.6 cm) Weight: 150 lb 2.1 oz (68.1 kg) IBW/kg (Calculated) : 54.7  Temp (24hrs), Avg:98.8 F (37.1 C), Min:98 F (36.7 C), Max:99.9 F (37.7 C)   Recent Labs Lab 09/16/15 0529 09/16/15 1542 09/17/15 0548 09/18/15 0737 09/19/15 0753 09/20/15 0903 09/21/15 0432 09/21/15 1135 09/22/15 0351  WBC 10.4  --  16.1* 12.1*  --   --   --   --  9.3  CREATININE 0.82  --   --   --  0.93 0.99  --  1.00 0.94  VANCOTROUGH  --  15  --    --   --   --  37*  --   --   VANCORANDOM  --   --   --   --   --   --   --   --  10  GENTTROUGH  --   --   --   --   --  2.2*  --   --   --     Estimated Creatinine Clearance: 76.2 mL/min (by C-G formula based on Cr of 0.94).    No Known Allergies  Antimicrobials this admission: Genatmicin 4/3 >> (09/26/15) Vancomycin 4/2 >> (10/22/15) Rifampin 4/2 >> (10/22/15) Zosyn 4/1 >> 4/2 Cefepime 4/1 x 1  Dose adjustments this admission: 4/3 VT 29 on 1 gm IV q8h --> changed to 1,250 mg IV q12h 4/6 VT 15 on 1,250 mg IV q12h --> changed to 1,500 mg IV q12h 4/11 VT 37 on 1500 mg IV q12h --> hold vanc and recheck level in 24 hours 4/12 VR 10 - restart 1,000 mg IV q12h  4/4 Gent trough 0.7 on 70 mg IV q8h 4/10 Gent trough 2.2 --> change to 60 mg IV q12h 4/12 Gent trough 1 on 60 mg IV q12h --> change to 60 mg IV q24h  Microbiology results: 4/1  Blood - MRSA 4/1 MRSA PCR - POS 4/3 Blood x2 NG final 3/31 blood x2 NEG  Thank you for allowing pharmacy to be a part of this patient's care.  Garyn Waguespack L. Roseanne RenoStewart, PharmD PGY2 Infectious Diseases Pharmacy Resident Pager: [phone] 09/22/2015 2:38 PM

## 2015-09-22 NOTE — Progress Notes (Signed)
[facility]    Date of Admission:  09/10/2015   Total days of antibiotics 11        Day 10 of gentamicin        Day 11 of rifampin        Day 12 of vancomycin  Principal Problem:   Acute bacterial endocarditis - right sided Active Problems:   IV drug abuse   Anemia   Thrombocytopenia, unspecified (HCC)   Hepatitis C   Hypokalemia   Septic embolism (HCC)   Prolonged Q-T interval on ECG   Narcotic dependence (HCC)   Palliative care encounter   Respiratory failure (HCC)   Staphylococcus aureus bacteremia with sepsis (HCC)   Acute renal failure (HCC)   Narcotic abuse   MRSA bacteremia   Dehydration   . clonazePAM  1 mg Oral BID  . enoxaparin (LOVENOX) injection  40 mg Subcutaneous Q24H  . gentamicin  60 mg Intravenous Q12H  . metoprolol tartrate  12.5 mg Oral BID  . morphine  75 mg Oral Q12H  . polyethylene glycol  17 g Oral Daily  . rifampin  300 mg Oral 3 times per day  . senna  1 tablet Oral QHS  . sodium chloride flush  3 mL Intravenous Q12H  . vancomycin  1,000 mg Intravenous Q12H    SUBJECTIVE:   Review of Systems: ROS  Past Medical History  Diagnosis Date  . Anemia   . IVDU (intravenous drug user)   . 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 Hepatitis C   . Miscarriage     Five times  . ARF (acute renal failure) (HCC) 4//2017    Social History  Substance Use Topics  . Smoking status: Current Every Day Smoker -- 1.00 packs/day for 10 years    Types: Cigarettes  . Smokeless tobacco: Never Used     Comment: 1 1/2 ppd  . Alcohol Use: No    Family History  Problem Relation Age of Onset  . Hypertension Father   . Kidney failure Father   . Hyperlipidemia Mother   . CAD Mother   . Deafness Mother    No Known Allergies  OBJECTIVE: Filed Vitals:   09/21/15 2123 09/22/15 0546 09/22/15 0547 09/22/15 0745  BP: 100/55 96/61  94/59  Pulse: 107 96 94 93  Temp: 99.9 F  (37.7 C) 98 F (36.7 C)  98.3 F (36.8 C)  TempSrc: Oral   Oral  Resp: Height:      Weight:   150 lb 2.1 oz (68.1 kg)   SpO2: 92% 83% 92% 90%   Body mass index is 25.76 kg/(m^2).  Physical Exam  Lab Results Lab Results  Component Value Date   WBC 9.3 09/22/2015   HGB 8.6* 09/22/2015   HCT 28.3* 09/22/2015   MCV 76.7* 09/22/2015   PLT 175 09/22/2015    Lab Results  Component Value Date   CREATININE 0.94 09/22/2015   BUN 16 09/22/2015   NA 134* 09/22/2015   K 4.4 09/22/2015   CL 103 09/22/2015   CO2 23 09/22/2015    Lab Results  Component Value Date   ALT 12* 09/22/2015   AST 16 09/22/2015   ALKPHOS 87 09/22/2015   BILITOT 0.7 09/22/2015     Microbiology: Recent Results (from the past 240 hour(s))  Culture, blood (Routine X 2) w Reflex  to ID Panel     Status: None   Collection Time: 09/13/15  4:05 PM  Result Value Ref Range Status   Specimen Description BLOOD LEFT ANTECUBITAL  Final   Special Requests IN PEDIATRIC BOTTLE 3CC  Final   Culture NO GROWTH 5 DAYS  Final   Report Status 09/18/2015 FINAL  Final  Culture, blood (Routine X 2) w Reflex to ID Panel     Status: None   Collection Time: 09/13/15  4:15 PM  Result Value Ref Range Status   Specimen Description BLOOD LEFT ANTECUBITAL  Final   Special Requests IN PEDIATRIC BOTTLE 0.5CC  Final   Culture NO GROWTH 5 DAYS  Final   Report Status 09/18/2015 FINAL  Final     ASSESSMENT: Melanie Crane continues to improve on her therapy for recurrent MRSA tricuspid valve endocarditis. Her vancomycin and gentamicin trough levels have been elevated and are being managed by myself Wilford Sports(Cassie, ID Pharmacy Resident). Her gentamicin level was 1 this morning on 60 mg IV q12h.  We will change her to 60 mg IV q24h for a projected peak of 3.4 and projected trough of ~0.25.  She will only need 4 more days of the gentamicin (stop date 09/26/15, total of 2 weeks of gentamicin). No need to draw any more labs or troughs on  gentamicin, she should be fine on the 60 mg IV q24h x 4 more days.  Her vancomycin troughs and levels have also been elevated.  She had a vancomycin trough drawn yesterday morning and it was elevated at 37.  A random level 24 hours later was 10.  I restarted her on 1,000 mg IV q12h.  She will need another vancomycin trough drawn tomorrow 4/13 around 2000. If it is above 25 - her dose will need to be held and her regimen will need to be recalculated.  I am available for any questions Tennis Must(Cassie Stewart, PharmD pager # [phone]). She will need to continue the vancomycin and rifampin for a total of 6 weeks of therapy (stop date 10/22/15).   Also, while on vancomycin, she will need twice weekly serum creatinine to monitor her renal function closely.  Her renal function here has been stable, but she is at high risk for nephrotoxicity with her antibiotic regimen.   PLAN: 1. Please discharge her on gentamicin 60 mg IV q24h - stop date 09/26/15 2. Please discharge her on vancomycin 1,000 mg IV q12h and rifampin 300 mg IV q8h - stop date 10/22/15 3. Please check a vancomycin trough TOMORROW 4/13 before the 2130 dose 4. Please check her serum creatinine twice weekly 5. She is welcome to follow-up in the clinic with myself and/or Dr. Orvan Falconerampbell 6. Please call or page with any additional questions  Cassie L. Roseanne RenoStewart, PharmD PGY2 Infectious Diseases Pharmacy Resident Pager: [phone] 09/22/2015 1:12 PM   Addendum: I have seen and examined Melanie Crane today and discussed her management with Tennis Mustassie Stewart. Melanie Crane is improving on therapy for recurrent MRSA tricuspid valve endocarditis. So far she is tolerating her 3 drug antibiotic regimen. She needs 4 more days of gentamicin and a full 6 weeks total therapy with IV vancomycin and rifampin through 10/22/2015. I encouraged her to consider finding a drug treatment program after she leaves her skilled nursing facility. She has little insight into her risk of relapse  and the potential health consequences. She will be going to a skilled nursing facility 30 miles from here and I'm not sure if it's feasible for  her to follow-up with [place] in clinic but certainly that can be arranged if the facility can arrange transportation.  Cliffton Asters, MD [facility] [phone] pager   [phone] cell 09/22/2015, 3:01 PM

## 2015-09-22 NOTE — Discharge Summary (Addendum)
Physician Discharge Summary  Melanie Crane BPZ:[id number] DOB: [birth year] DOA: 09/10/2015  PCP: No PCP Per Patient  Admit date: 09/10/2015 Discharge date: 09/22/2015  Time spent: 55 minutes  Recommendations for Outpatient Follow-up:  1. Complete 6 weeks of rifampicin 300 3 times a day, gentamicin 60 mg qd--ONLY FOR 2 WEEK--ending 09/26/15, vancomycin 1000 every 12 as per ID protocol-->till 10/22/15--->Get a trough for Vanc 09/23/15 @ 12:00 pm--->THIS should be monitored very carefully-very high risk for ootoxicity, nephrotoxicity given combination of medications--suggest weekly Chem-7, CRP, ESR, and blood cultures as surveillance cultures every 2 weeks reported to an infectious disease specialist so that delineation of her antibiotics can be performed 2. Multiple controlled substances have been prescribed for the patient-we did express a clear understanding that these are bridge for her to get herself treated either with Suboxone or Methadone as an outpatient once patient has been cleared from an endocarditis perspective. 3. PICC line should be removed at skilled nursing facility once she has completed treatment 4. Suggest treatment of  hepatitis C if patient can comply with close follow-up. 5. Continue MiraLAX as well as senna for stool and bowel regimen 6. Consider checking as an outpatient iron studies and CBC-patient had iatrogenic blood draw anemia of blood loss in the hospital 7. Recommend repeat echocardiogram 6-8 weeks status post treatment with antibiotic regimen to assure and ensure no further vegetation present-note that patient has had 2 surgeries for endocarditis in the past.   Discharge Diagnoses:  Principal Problem:   Acute bacterial endocarditis - right sided Active Problems:   IV drug abuse   Anemia   Thrombocytopenia, unspecified (HCC)   Hepatitis C   Hypokalemia   Septic embolism (HCC)   Prolonged Q-T interval on ECG   Narcotic dependence ([place])   Palliative care  encounter   Respiratory failure ([place])   Staphylococcus aureus bacteremia with sepsis ([place])   Acute renal failure ([place])   Narcotic abuse   MRSA bacteremia   Dehydration   Discharge Condition: Fair  Diet recommendation: Heart healthy low-salt  Filed Weights   09/20/15 0500 09/21/15 0602 09/22/15 0547  Weight: 66.4 kg (146 lb 6.2 oz) 67.813 kg (149 lb 8 oz) 68.1 kg (150 lb 2.1 oz)    History of present illness:  40 y.o. 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 Iona Beard [facility], Dr Heywood Bene Sharma-Cards]  chronic anemia,   hepatitis C +   smoker Note admitted 4/5-4/26/16-->cavitary lung lesions on CT chest --gram neg bacteremia[see care everywhere 10/06/14] At that admit DVT RUE [provoked-PICC line related]-LUPUS AC +?--advised lifelong AC??--Left AMA that admission who presents to the ED with chest pain, fevers, and malaise.   Patient reports that she had just been admitted to a hospital in [place], but left [place] due to disagreement with her treatment plan  Admitted to SDU initially and stayed in our hospital for 11 days. 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 Repeat blood cultures on 4/3 negative.ar. Underwent TEE on 4/7-which shows a large vegetation-subsequently seen by cardiothoracic surgery-recommendations are to continue with IV antibiotics. Placed PICC line. Continue current antibiotics with careful monitoring for 6 weeks through 10/22/2015.skilled nursing facility placement.   Acute hypoxemic respiratory failure: Likely due to septic emboli.Recent CTA Chest 4/1 negative for Pul Embolism. Chest  x-ray 4/6 negative for major abnormalities except for known septic emboli/nodular lesions. Continue to provide supportive  care  Not sure if anxiety is playing a role.  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 Note that patient was on metoprolol 25 twice a day for severe tachycardia initially and this was transitioned to 12.5 twice a day on day of discharge and her hypotension resolved  Chronic pain syndrome: Claims to have chronic chest pain- for which she abuses opana (intravenously)-claims takes around 40 mg TID-after discussion with psychiatry-we have increased MS Contin and have increased dosage for oxycodone for breakthrough pain. Currently pain seems relatively well-controlled-with MS Contin to 75 mg twice a day.  Difficult situation, in order to ensure compliance to medication and procedure she is going to need adequate pain control, Patient has exhibited in the past pain seeking behavior and has relented to comply with her goals of pain management for her but she will need ongoing soup attacks or methadone initiation by licensed practitioner or clinic as an outpatient. I would recommend not escalating her chronic pain medication dosage as this will increase tolerance but it may be necessary to do so.  History of IVDA (opiates-crushes opana): Counseled extensively. Initially patient was IVC'd. Patient has capacity to make on medical decisions. Looking forward to residential substance abuse rehabilitation program when medically stable.  Mild thrombocytopenia: Improving. Watch closely while on Lovenox.  Anemia: Likely secondary to acute illness/endocarditis-follow periodically.  Suicidal ideation: Reported that patient made multiple statements about "wanting to die", psych was consulted who recommended IVC. IVC placed. Dr. Louretta Shorten, psychiatry, consulted with patient and IVC removed, safety sitter discontinued  History of RUE DVT: PICC-line related during 09/2014 admission- had lupus anticoagulant positive-Dr.  Jaynee Eagles with hematology-Dr.Kale-and felt that patient did not require anticoagulation in this setting furthermore, patient is an active IVDA-noncompliant to antibiotics-and will be a very poor long-term anticoagulation candidate.  Chronic hepatitis C: follow up with ID clinic as an outpatient if patient is able to comply with treatment recommendations as well as cessation of IV drug use  Procedures:  TTE performed on 09/12/2015 - Left ventricle: The cavity size was normal. Systolic function was  normal. The estimated ejection fraction was in the range of 60%  to 65%. Wall motion was normal; there were no regional wall  motion abnormalities. There was an increased relative  contribution of atrial contraction to ventricular filling.  Doppler parameters are consistent with abnormal left ventricular  relaxation (grade 1 diastolic dysfunction). - Aortic valve: There was mild regurgitation. - Mitral valve: There was mild regurgitation. - Left atrium: There is a rounded mass measuring 2x1cm that  emanates off of the anterior wall of the LA that is seen best in  the apical 3 chamber view of unclear etiology. Consider TEE for  further evaluation. - Right atrium: The atrium was severely dilated. - Atrial septum: The septum bowed from right to left, consistent  with increased right atrial pressure. - Tricuspid valve: S/P TV replacement. There is a mobile shaggy  density on the TVR that is most consistent with vegetation. The  mean gradient across the valve is 78mHg c/w with some degree of  TV stenosis.  Consultations:  ID  Discharge Exam: Filed Vitals:   09/22/15 0547 09/22/15 0745  BP:  94/59  Pulse: 94 93  Temp:  98.3 F (36.8 C)  Resp:  17    General: eomi ncat Cardiovascular:  s1 s2 no m/r/g Respiratory: clear no added sound  Discharge Instructions    Current Discharge Medication List    START taking these medications   Details  clonazePAM  (KLONOPIN) 1 MG tablet Take 1 tablet (1 mg total) by mouth 2 (two) times daily. Qty: 30 tablet, Refills: 0    gentamicin (GARAMYCIN) 1.2-0.9 MG/ML-% Inject 50 mLs (60 mg total) into the vein daily. Qty: 50 mL, Refills: 0    methocarbamol (ROBAXIN) 500 MG tablet Take 1 tablet (500 mg total) by mouth every 6 (six) hours as needed for muscle spasms. Qty: 45 tablet, Refills: 0    metoprolol tartrate (LOPRESSOR) 25 MG tablet Take 0.5 tablets (12.5 mg total) by mouth 2 (two) times daily. Qty: 60 tablet, Refills: 0    morphine (MS CONTIN) 15 MG 12 hr tablet Take 5 tablets (75 mg total) by mouth every 12 (twelve) hours. Qty: 10 tablet, Refills: 0    oxyCODONE (ROXICODONE) 15 MG immediate release tablet Take 1 tablet (15 mg total) by mouth every 4 (four) hours as needed for moderate pain. Qty: 30 tablet, Refills: 0    rifampin (RIFADIN) 300 MG capsule Take 1 capsule (300 mg total) by mouth every 8 (eight) hours. Qty: 9 capsule, Refills: 0    senna (SENOKOT) 8.6 MG TABS tablet Take 1 tablet (8.6 mg total) by mouth at bedtime. Qty: 120 each, Refills: 0    vancomycin (VANCOCIN) 1 GM/200ML SOLN Inject 200 mLs (1,000 mg total) into the vein every 12 (twelve) hours. Qty: 4000 mL, Refills: 0       No Known Allergies    The results of significant diagnostics from this hospitalization (including imaging, microbiology, ancillary and laboratory) are listed below for reference.    Significant Diagnostic Studies: Dg Chest 2 View  09/12/2015  CLINICAL DATA:  Respiratory failure. EXAM: CHEST  2 VIEW COMPARISON:  September 11, 2015. FINDINGS: Stable cardiomegaly. No pneumothorax is noted. Mild bibasilar opacities are noted concerning for subsegmental atelectasis or possibly inflammation. Bony thorax is unremarkable. IMPRESSION: Mild bibasilar opacities concerning for subsegmental atelectasis or possibly inflammation. Electronically Signed   By: Marijo Conception, M.D.   On: 09/12/2015 09:11   Dg Chest 2  View  09/10/2015  CLINICAL DATA:  Acute onset of generalized chest pain and shortness of breath. Drowsiness. Initial encounter. EXAM: CHEST  2 VIEW COMPARISON:  CTA of the chest performed 04/10/2015 FINDINGS: Right midlung density, measuring 3.1 cm, demonstrates increased density compared to prior studies. This may reflect residual or recurrent cavitating pneumonia or abscess, though a mass cannot be excluded. No pleural effusion or pneumothorax is seen. The heart is normal in size. No acute osseous abnormalities are identified. IMPRESSION: Right midlung density, measuring 3.1 cm, demonstrates increased density compared to prior studies. This may reflect residual or recurrent cavitating pneumonia or abscess, though a mass cannot be excluded. CT of the chest could be considered for further evaluation, when and as deemed clinically appropriate. Electronically Signed   By: Garald Balding M.D.   On: 09/10/2015 23:23   Ct Angio Chest Pe W/cm &/or Wo Cm  09/11/2015  CLINICAL DATA:  Chronic nonradiating chest pain, fever, shortness of breath, cough and mild abdominal pain. Initial encounter. EXAM: CT ANGIOGRAPHY CHEST WITH CONTRAST TECHNIQUE: Multidetector CT imaging of the chest was performed using the standard protocol during bolus administration of intravenous contrast. Multiplanar CT image reconstructions and MIPs were obtained to evaluate the vascular anatomy. CONTRAST:  77m OMNIPAQUE IOHEXOL 350 MG/ML SOLN COMPARISON:  Chest radiograph performed 09/10/2015, and CTA of the chest performed 04/10/2015 FINDINGS: There is no evidence of significant pulmonary embolus. Multiple nodular opacities are noted scattered throughout the lungs, many of which demonstrate central cavitation, most compatible with diffuse bilateral septic emboli. Underlying trace right-sided pleural fluid is noted. No pneumothorax is seen. Scattered coronary artery calcifications are seen. Prominent subcarinal and right hilar nodes are seen,  measuring 1.6 cm and 1.2 cm in short axis. No pericardial effusion is identified. The great vessels are grossly unremarkable in appearance. Postoperative change is noted about the right atrium and right ventricle. No axillary lymphadenopathy is seen. The visualized portions of the thyroid gland are unremarkable in appearance. The visualized portions of the liver are unremarkable. The spleen is somewhat bulky, but only borderline prominent in length. Trace ascites is suggested noted tracking about the liver and spleen. No acute osseous abnormalities are seen. Review of the MIP images confirms the above findings. IMPRESSION: 1. No evidence of significant pulmonary embolus. 2. Multiple nodular opacities scattered throughout the lungs, many of which demonstrate central cavitation, most compatible with diffuse bilateral septic emboli, given the patient's history. 3. Underlying trace right-sided pleural fluid noted. 4. Prominent mediastinal and right hilar nodes noted, likely reflecting the underlying infection. 5. Scattered coronary artery calcifications seen. 6. Suggestion of trace ascites at the upper abdomen. These results were called by telephone at the time of interpretation on 09/11/2015 at 2:49 am to Dr. Ripley Fraise, who verbally acknowledged these results. Electronically Signed   By: Garald Balding M.D.   On: 09/11/2015 02:49   Dg Chest Port 1 View  09/16/2015  CLINICAL DATA:  Shortness of breath. EXAM: PORTABLE CHEST 1 VIEW COMPARISON:  09/12/2015.  CT 09/11/2015. FINDINGS: Heart size normal. Persistent nodular densities/infiltrates are again noted throughout both lungs. Cavitary nodular densities are noted on prior recent CT of 09/11/2015. No pleural effusion pneumothorax. Stable cardiomegaly. No acute bony abnormality. IMPRESSION: Persistent nodular opacities noted throughout both lungs. Similar findings noted on prior exams. Cavitary nodular infiltrates were noted on prior CT of 09/11/2015. Electronically  Signed   By: Marcello Moores  Register   On: 09/16/2015 08:12   Dg Chest Portable 1 View  09/11/2015  CLINICAL DATA:  Sepsis, endocarditis, fever EXAM: PORTABLE CHEST 1 VIEW COMPARISON:  Portable exam 1548 hours compared to 09/10/2015 and correlated with chest CT of 09/11/2015 FINDINGS: Normal size of cardiac silhouette post TVR. Mediastinal contours and pulmonary vascularity normal. Lungs clear. No pleural effusion or pneumothorax. Bones unremarkable. IMPRESSION: No acute abnormalities. Electronically Signed   By: Lavonia Dana M.D.   On: 09/11/2015 15:58    Microbiology: Recent Results (from the past 240 hour(s))  Culture, blood (Routine X 2) w Reflex to ID Panel     Status: None   Collection Time: 09/13/15  4:05 PM  Result Value Ref Range Status   Specimen Description BLOOD LEFT ANTECUBITAL  Final   Special Requests IN PEDIATRIC BOTTLE 3CC  Final   Culture NO GROWTH 5 DAYS  Final   Report Status 09/18/2015 FINAL  Final  Culture, blood (Routine X 2) w Reflex to ID Panel     Status: None   Collection Time: 09/13/15  4:15 PM  Result Value Ref Range Status   Specimen Description BLOOD LEFT ANTECUBITAL  Final   Special Requests IN PEDIATRIC BOTTLE 0.5CC  Final   Culture NO GROWTH 5 DAYS  Final   Report Status 09/18/2015 FINAL  Final     Labs: Basic Metabolic Panel:  Recent Labs Lab 09/16/15 0529 09/19/15 0753 09/20/15 0903 09/21/15 1135 09/22/15 0351  NA 137 135 134* 134* 134*  K 4.8 4.5 4.7 4.2 4.4  CL 102 104 102 101 103  CO2 23 17* 21* 24 23  GLUCOSE 92 135* 112* 114* 104*  BUN 7 11 14 14 16   CREATININE 0.82 0.93 0.99 1.00 0.94  CALCIUM 8.1* 7.9* 8.1* 7.8* 8.1*   Liver Function Tests:  Recent Labs Lab 09/22/15 0351  AST 16  ALT 12*  ALKPHOS 87  BILITOT 0.7  PROT 6.7  ALBUMIN 1.9*   No results for input(s): LIPASE, AMYLASE in the last 168 hours. No results for input(s): AMMONIA in the last 168 hours. CBC:  Recent Labs Lab 09/16/15 0529 09/17/15 0548 09/18/15 0737  09/22/15 0351  WBC 10.4 16.1* 12.1* 9.3  HGB 10.4* 11.4* 9.6* 8.6*  HCT 33.3* 36.0 31.5* 28.3*  MCV 76.7* 77.3* 77.2* 76.7*  PLT 135* 165 128* 175   Cardiac Enzymes: No results for input(s): CKTOTAL, CKMB, CKMBINDEX, TROPONINI in the last 168 hours. BNP: BNP (last 3 results) No results for input(s): BNP in the last 8760 hours.  ProBNP (last 3 results) No results for input(s): PROBNP in the last 8760 hours.  CBG: No results for input(s): GLUCAP in the last 168 hours.     SignedNita Sells MD   [facility] 09/22/2015, 11:34 AM

## 2015-09-22 NOTE — Progress Notes (Signed)
Patient will discharge to [facility] Anticipated discharge date: 4/12 Family notified: pt does not want family notified Transportation by [facility]- called at 2pm  CSW signing off.  Merlyn LotJenna Holoman, LCSWA Clinical Social Worker [phone]

## 2015-09-22 NOTE — Progress Notes (Addendum)
Nsg Discharge Note  Admit Date:  09/10/2015 Discharge date: 09/22/2015   Sandi RavelingJoann Theresa Baldridge to be D/C'd SNF per MD order.  AVS completed.  Copy for chart, and copy for patient signed, and dated. Patient/caregiver able to verbalize understanding.  Discharge Medication:   Medication List    TAKE these medications        clonazePAM 1 MG tablet  Commonly known as:  KLONOPIN  Take 1 tablet (1 mg total) by mouth 2 (two) times daily.     gentamicin 1.2-0.9 MG/ML-%  Commonly known as:  GARAMYCIN  Inject 50 mLs (60 mg total) into the vein daily.  Start taking on:  09/23/2015     methocarbamol 500 MG tablet  Commonly known as:  ROBAXIN  Take 1 tablet (500 mg total) by mouth every 6 (six) hours as needed for muscle spasms.     metoprolol tartrate 25 MG tablet  Commonly known as:  LOPRESSOR  Take 0.5 tablets (12.5 mg total) by mouth 2 (two) times daily.     morphine 15 MG 12 hr tablet  Commonly known as:  MS CONTIN  Take 5 tablets (75 mg total) by mouth every 12 (twelve) hours.     oxyCODONE 15 MG immediate release tablet  Commonly known as:  ROXICODONE  Take 1 tablet (15 mg total) by mouth every 4 (four) hours as needed for moderate pain.     rifampin 300 MG capsule  Commonly known as:  RIFADIN  Take 1 capsule (300 mg total) by mouth every 8 (eight) hours.     senna 8.6 MG Tabs tablet  Commonly known as:  SENOKOT  Take 1 tablet (8.6 mg total) by mouth at bedtime.     vancomycin 1 GM/200ML Soln  Commonly known as:  VANCOCIN  Inject 200 mLs (1,000 mg total) into the vein every 12 (twelve) hours.        Discharge Assessment: Filed Vitals:   09/22/15 0745 09/22/15 1427  BP: 94/59 100/62  Pulse: 93 93  Temp: 98.3 F (36.8 C) 97.7 F (36.5 C)  Resp: 17 18   Skin clean, dry and intact without evidence of skin break down, no evidence of skin tears noted. IV catheter discontinued intact. Site without signs and symptoms of complications - no redness or edema noted at  insertion site, patient denies c/o pain - only slight tenderness at site.  Dressing with slight pressure applied.  D/c Instructions-Education: Discharge instructions given to patient/family with verbalized understanding. D/c education completed with patient/family including follow up instructions, medication list, d/c activities limitations if indicated, with other d/c instructions as indicated by MD - patient able to verbalize understanding, all questions fully answered. Picc line intact, IV team tended to before discharge.  Patient instructed to return to ED, call 911, or call MD for any changes in condition.  Patient escorted via EMS, attempt to call report times 2. Also, patient belongings from safe returned to patient.   Kern ReapBrumagin, Jazziel Fitzsimmons L, RN 09/22/2015 3:11 PM

## 2015-09-22 NOTE — Clinical Social Work Placement (Signed)
   CLINICAL SOCIAL WORK PLACEMENT  NOTE  Date:  09/22/2015  Patient Details  Name: Melanie Crane MRN: [id number] Date of Birth: 02/28/1976  Clinical Social Work is seeking post-discharge placement for this patient at the Skilled  Nursing Facility level of care (*CSW will initial, date and re-position this form in  chart as items are completed):  Yes   Patient/family provided with [facility] Clinical Social Work Department's list of facilities offering this level of care within the geographic area requested by the patient (or if unable, by the patient's family).  Yes   Patient/family informed of their freedom to choose among providers that offer the needed level of care, that participate in Medicare, Medicaid or managed care program needed by the patient, have an available bed and are willing to accept the patient.  Yes   Patient/family informed of [facility]'s ownership interest in [facility] and [facility], as well as of the fact that they are under no obligation to receive care at these facilities.  PASRR submitted to EDS on 09/17/15     PASRR number received on 09/19/15     Existing PASRR number confirmed on       FL2 transmitted to all facilities in geographic area requested by pt/family on 09/21/15     FL2 transmitted to all facilities within larger geographic area on 09/21/15     Patient informed that his/her managed care company has contracts with or will negotiate with certain facilities, including the following:        Yes   Patient/family informed of bed offers received.  [facility]     Physician recommends and patient chooses bed at      [facility].  Patient to be transferred to facility by [facility]     Patient family notified on 09/22/15 of transfer.  Name of family member notified:  pt does not want family notified     PHYSICIAN       Additional  Comment:    _______________________________________________ Izora RibasHoloman, Suhey Radford M, LCSW 09/22/2015, 1:58 PM

## 2016-03-12 DEATH — deceased

## 2017-04-07 IMAGING — DX DG CHEST 2V
2 series · 2 of 2 positions shown · non-contrast
Comparison: Chest x-ray dated 12/28/2013.

CLINICAL DATA: Pain upper right breast towards medial border.
Bicuspid valve replaced 1 year ago. Chest pain and shortness breath
for 2 days.

EXAM:
CHEST  2 VIEW

[chest pa]
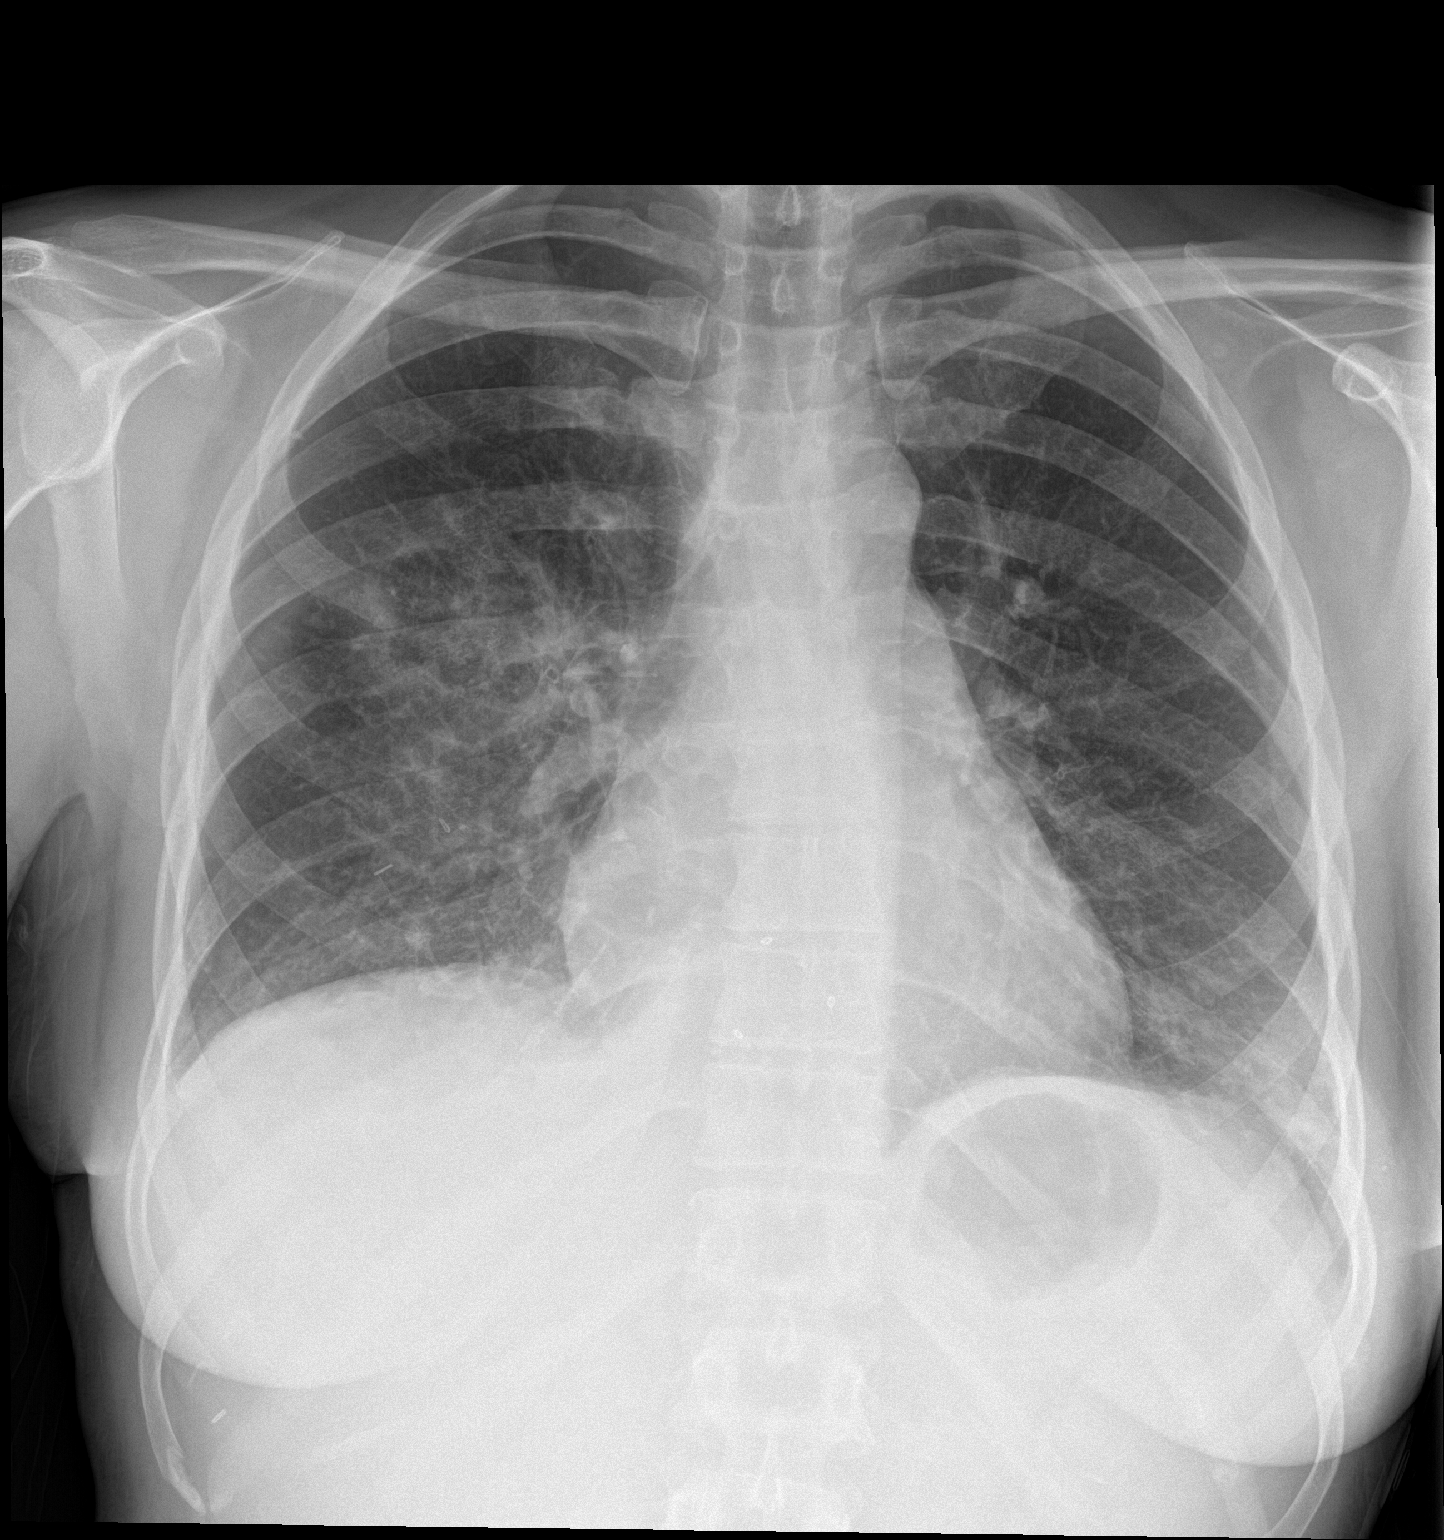

[chest lat]
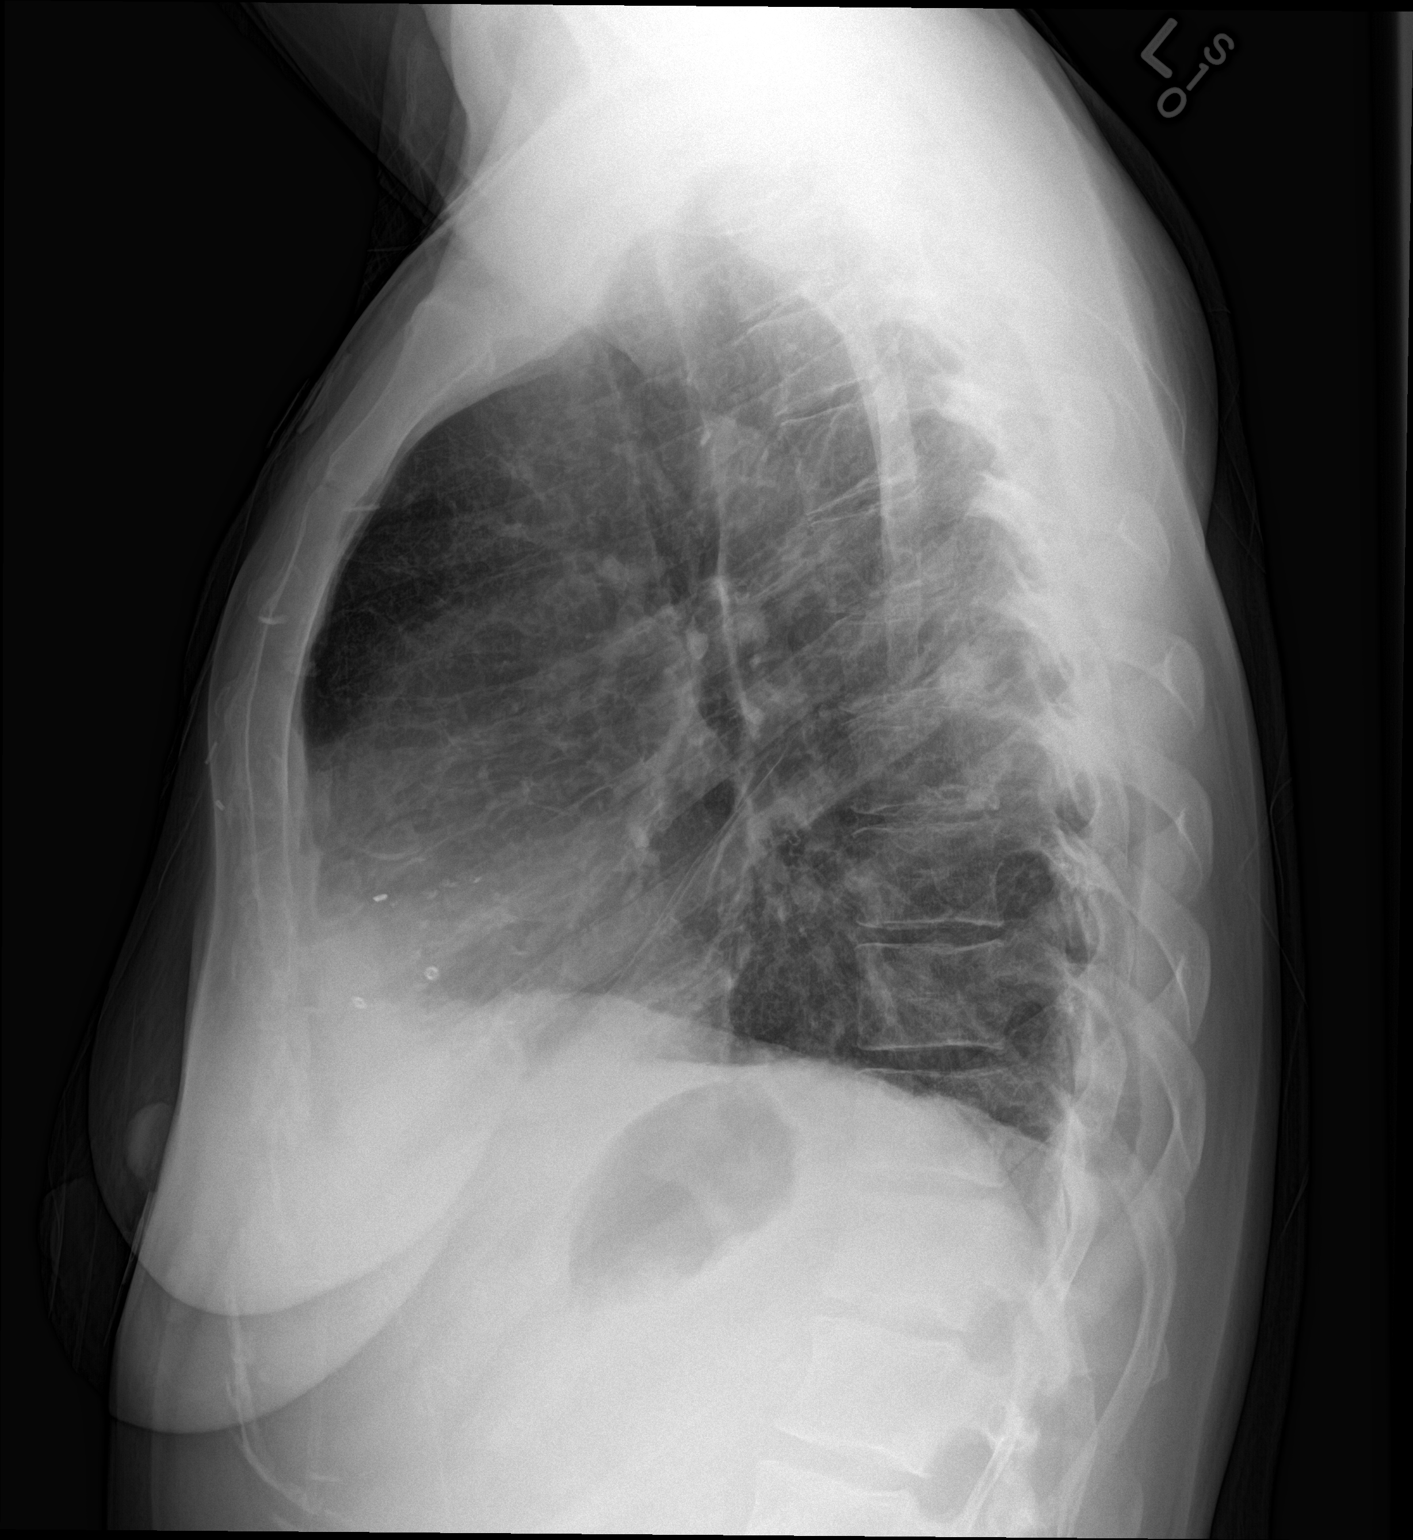

[2 of 2 positions shown; findings below may reference images not displayed]

FINDINGS: Heart size is upper normal, not significantly changed compared to
the previous exam. Overall cardiomediastinal silhouette is stable in
size and configuration.

Ill-defined opacities are seen within the right mid lung region and
at the left lung base. A focal rounded nodular density overlying a
mid thoracic vertebral body on the lateral view may be related to
the right mid lung opacity.

No pleural effusions seen. No pneumothorax. No acute osseous
abnormality.
IMPRESSION: 1. Ill-defined opacity within the right mid lung region with an
additional smaller ill-defined opacity at the left lung base. This
could represent asymmetric edema or pneumonia.

2. Focal nodular density overlying a mid thoracic vertebral body on
the lateral view, measuring 1.3 cm greatest dimension, which may be
related to the right mid lung opacity. A neoplastic process cannot
be excluded.

Recommend chest CT for further characterization.

## 2017-09-08 IMAGING — CT CT ANGIO CHEST
2 of 6 series · 18 of 36 positions shown · IV contrast (Omni 300)
Comparison: Chest radiograph performed 09/10/2015, and CTA of the
chest performed 04/10/2015

CLINICAL DATA: Chronic nonradiating chest pain, fever, shortness of
breath, cough and mild abdominal pain. Initial encounter.

EXAM:
CT ANGIOGRAPHY CHEST WITH CONTRAST
TECHNIQUE: Multidetector CT imaging of the chest was performed using the
standard protocol during bolus administration of intravenous
contrast. Multiplanar CT image reconstructions and MIPs were
obtained to evaluate the vascular anatomy.
CONTRAST:  50mL OMNIPAQUE IOHEXOL 350 MG/ML SOLN

[Series 6: pe thins · axial · 0.68mm/px · z∈[+1037,+1313]mm · 17 of 612 slices shown]
[im 30/612  lung]
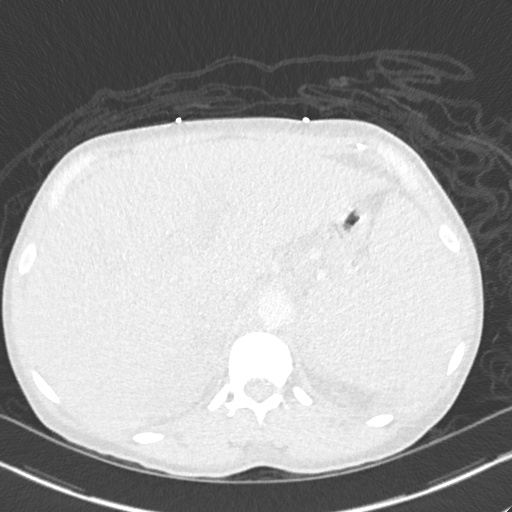
[im 59/612  mediastinal]
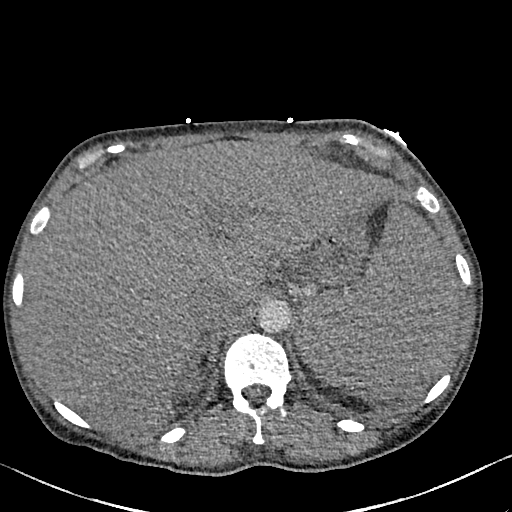
[im 88/612  lung]
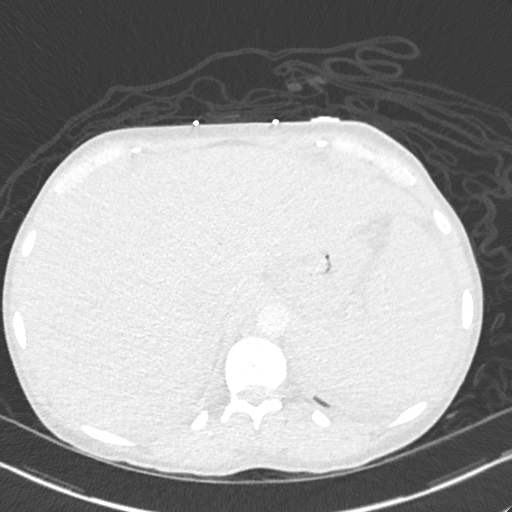
[im 146/612  mediastinal]
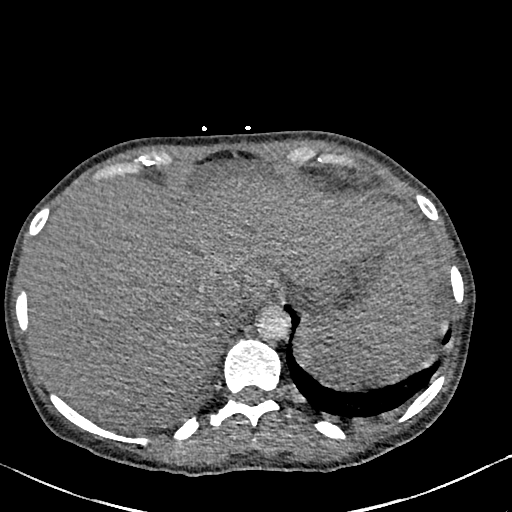
[im 175/612  lung]
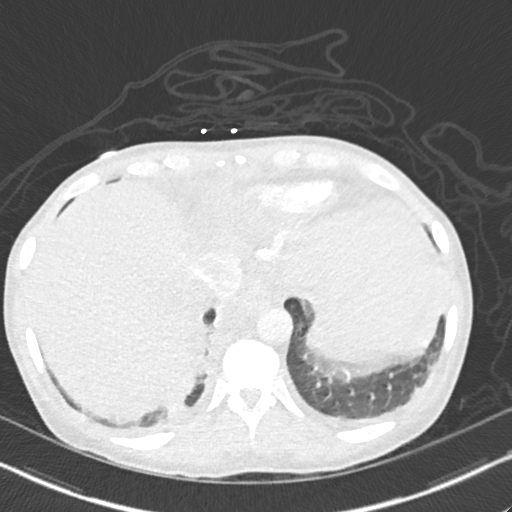
[im 204/612  mediastinal]
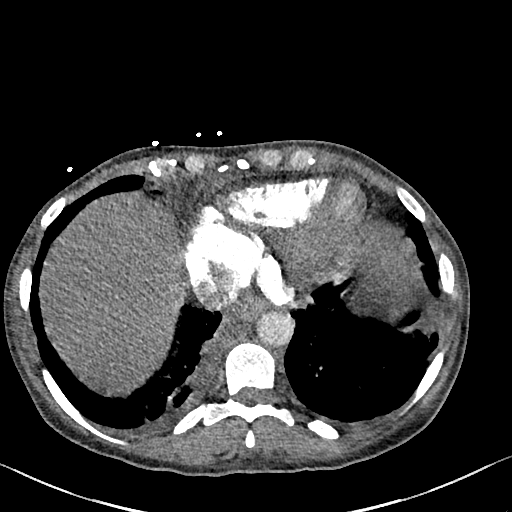
[im 233/612  lung]
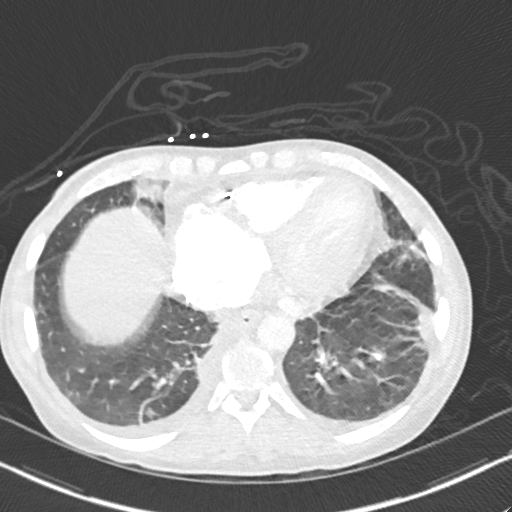
[im 262/612  mediastinal]
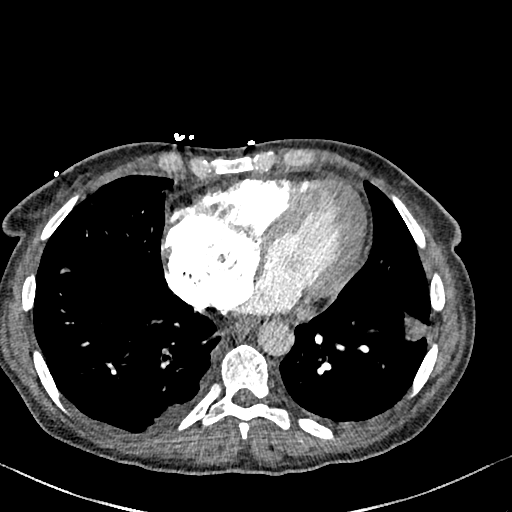
[im 321/612  lung]
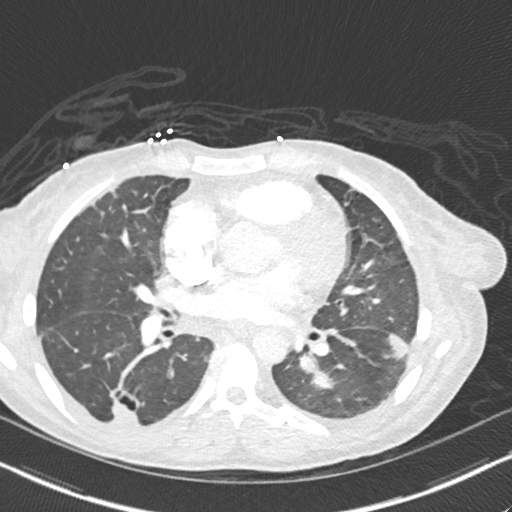
[im 350/612  mediastinal]
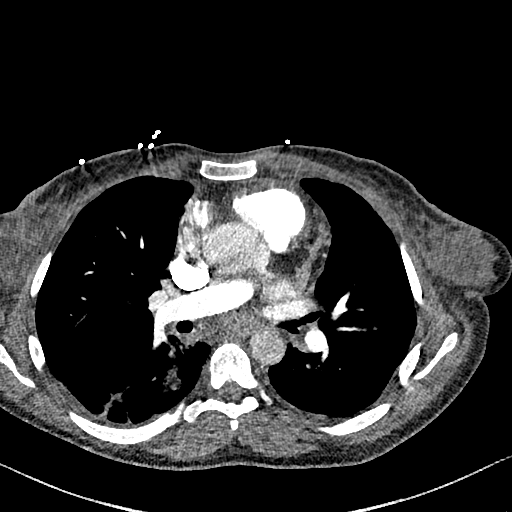
[im 379/612  lung]
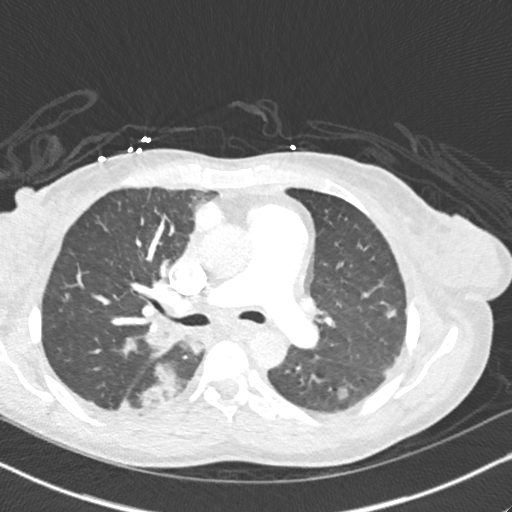
[im 408/612  mediastinal]
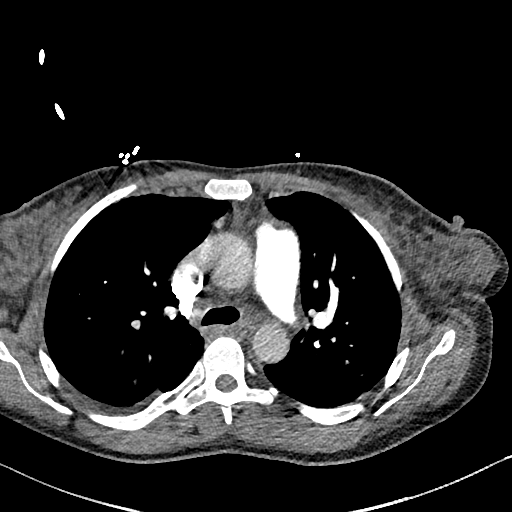
[im 437/612  lung]
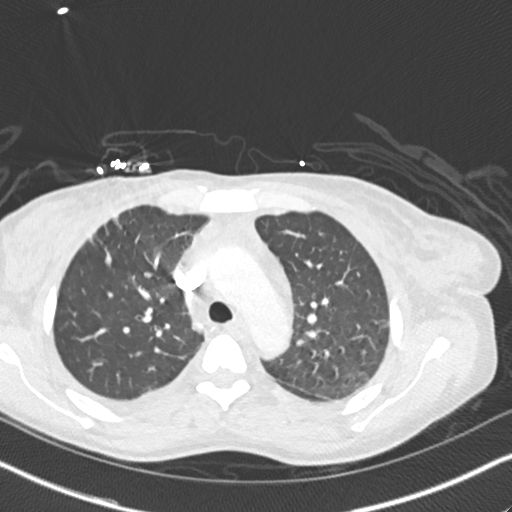
[im 466/612  mediastinal]
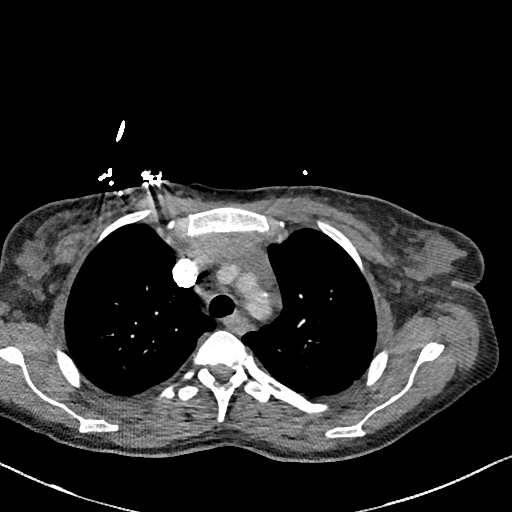
[im 524/612  lung]
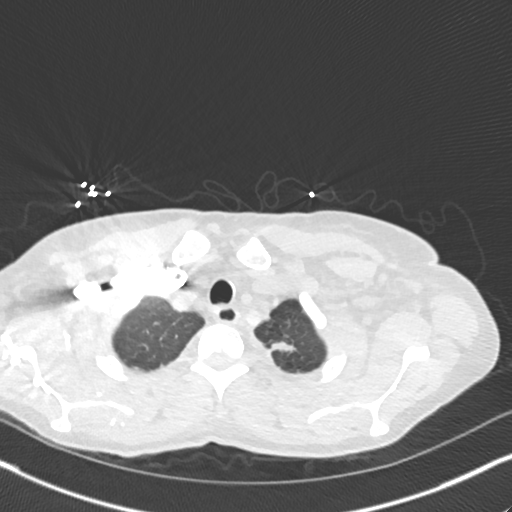
[im 553/612  mediastinal]
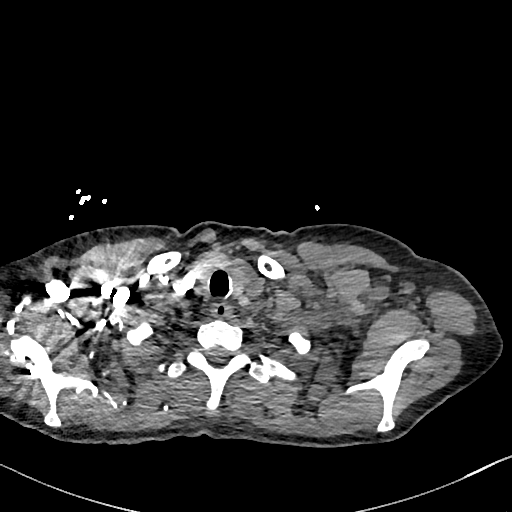
[im 582/612  lung]
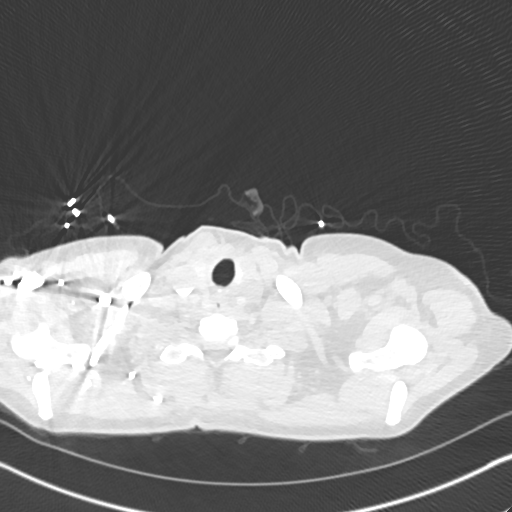

[Series 7: pe 2mm cor · coronal · 0.62mm/px · 1 of 114 slices shown]
[im 57/114  mediastinal]
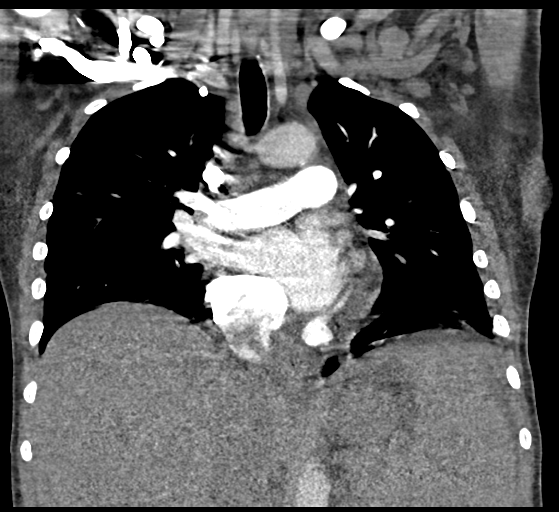

[18 of 36 positions shown; findings below may reference images not displayed]

FINDINGS: There is no evidence of significant pulmonary embolus.

Multiple nodular opacities are noted scattered throughout the lungs,
many of which demonstrate central cavitation, most compatible with
diffuse bilateral septic emboli. Underlying trace right-sided
pleural fluid is noted. No pneumothorax is seen.

Scattered coronary artery calcifications are seen. Prominent
subcarinal and right hilar nodes are seen, measuring 1.6 cm and
cm in short axis. No pericardial effusion is identified. The great
vessels are grossly unremarkable in appearance.

Postoperative change is noted about the right atrium and right
ventricle. No axillary lymphadenopathy is seen. The visualized
portions of the thyroid gland are unremarkable in appearance.

The visualized portions of the liver are unremarkable. The spleen is
somewhat bulky, but only borderline prominent in length. Trace
ascites is suggested noted tracking about the liver and spleen.

No acute osseous abnormalities are seen.

Review of the MIP images confirms the above findings.
IMPRESSION: 1. No evidence of significant pulmonary embolus.
2. Multiple nodular opacities scattered throughout the lungs, many
of which demonstrate central cavitation, most compatible with
diffuse bilateral septic emboli, given the patient's history.
3. Underlying trace right-sided pleural fluid noted.
4. Prominent mediastinal and right hilar nodes noted, likely
reflecting the underlying infection.
5. Scattered coronary artery calcifications seen.
6. Suggestion of trace ascites at the upper abdomen.
These results were called by telephone at the time of interpretation
on 09/11/2015 at [DATE] to Dr. NILDA OMETER PAUL KANG, who verbally
acknowledged these results.
